# Patient Record
Sex: Male | Born: 1960 | Race: White | Hispanic: No | Marital: Married
Health system: Midwestern US, Community
[De-identification: ages and names within clinical notes are randomized; demographics above are authoritative.]

## PROBLEM LIST (undated history)

## (undated) DIAGNOSIS — C642 Malignant neoplasm of left kidney, except renal pelvis: Principal | ICD-10-CM

## (undated) DIAGNOSIS — M86271 Subacute osteomyelitis, right ankle and foot: Principal | ICD-10-CM

## (undated) DIAGNOSIS — C649 Malignant neoplasm of unspecified kidney, except renal pelvis: Principal | ICD-10-CM

## (undated) DIAGNOSIS — R0989 Other specified symptoms and signs involving the circulatory and respiratory systems: Secondary | ICD-10-CM

## (undated) DIAGNOSIS — R918 Other nonspecific abnormal finding of lung field: Secondary | ICD-10-CM

## (undated) DIAGNOSIS — R52 Pain, unspecified: Secondary | ICD-10-CM

## (undated) DIAGNOSIS — Z8589 Personal history of malignant neoplasm of other organs and systems: Secondary | ICD-10-CM

## (undated) DIAGNOSIS — E119 Type 2 diabetes mellitus without complications: Secondary | ICD-10-CM

## (undated) DIAGNOSIS — C801 Malignant (primary) neoplasm, unspecified: Secondary | ICD-10-CM

## (undated) DIAGNOSIS — I10 Essential (primary) hypertension: Secondary | ICD-10-CM

## (undated) DIAGNOSIS — N189 Chronic kidney disease, unspecified: Secondary | ICD-10-CM

## (undated) DIAGNOSIS — Z87442 Personal history of urinary calculi: Secondary | ICD-10-CM

## (undated) DIAGNOSIS — E785 Hyperlipidemia, unspecified: Secondary | ICD-10-CM

## (undated) HISTORY — PX: VASECTOMY: SHX75

## (undated) HISTORY — DX: Essential (primary) hypertension: I10

## (undated) HISTORY — DX: Type 2 diabetes mellitus without complications: E11.9

## (undated) HISTORY — DX: Hyperlipidemia, unspecified: E78.5

## (undated) MED FILL — CEPHALEXIN 500MG CAPS: 500 MG | 10 days supply | Qty: 40 | Fill #0 | Status: AC

---

## 2000-12-05 ENCOUNTER — Other Ambulatory Visit: Admission: RE | Admit: 2000-12-05 | Discharge: 2000-12-05 | Payer: Self-pay | Admitting: Family Medicine

## 2001-03-31 ENCOUNTER — Emergency Department (HOSPITAL_COMMUNITY): Admission: EM | Admit: 2001-03-31 | Discharge: 2001-03-31 | Payer: Self-pay

## 2006-11-14 ENCOUNTER — Ambulatory Visit (HOSPITAL_COMMUNITY): Admission: RE | Admit: 2006-11-14 | Discharge: 2006-11-14 | Payer: Self-pay | Admitting: Family Medicine

## 2014-07-05 ENCOUNTER — Encounter: Payer: BLUE CROSS/BLUE SHIELD | Attending: Family Medicine

## 2014-07-05 VITALS — Ht 70.0 in | Wt 307.2 lb

## 2014-07-05 DIAGNOSIS — E119 Type 2 diabetes mellitus without complications: Secondary | ICD-10-CM | POA: Diagnosis present

## 2014-07-05 DIAGNOSIS — Z713 Dietary counseling and surveillance: Secondary | ICD-10-CM | POA: Diagnosis not present

## 2014-07-09 NOTE — Progress Notes (Signed)
Patient was seen on 07/05/14 for the first of a series of three diabetes self-management courses at the [facility].  Patient Education Plan per assessed needs and concerns is to attend four course education program for Diabetes Self Management Education.  The following learning objectives were met by the patient during this class:  Describe diabetes  State some common risk factors for diabetes  Defines the role of glucose and insulin  Identifies type of diabetes and pathophysiology  Describe the relationship between diabetes and cardiovascular risk  State the members of the Healthcare Team  States the rationale for glucose monitoring  State when to test glucose  State their individual Target Range  State the importance of logging glucose readings  Describe how to interpret glucose readings  Identifies A1C target  Explain the correlation between A1c and eAG values  State symptoms and treatment of high blood glucose  State symptoms and treatment of low blood glucose  Explain proper technique for glucose testing  Identifies proper sharps disposal  Handouts given during class include:  Living Well with Diabetes book  Carb Counting and Meal Planning book  Meal Plan Card  Carbohydrate guide  Meal planning worksheet  Low Sodium Flavoring Tips  The diabetes portion plate  K5L to eAG Conversion Chart  Diabetes Medications  Diabetes Recommended Care Schedule  Support Group  Diabetes Success Plan  Core Class Satisfaction Survey  Follow-Up Plan:  Attend core 2

## 2014-07-12 DIAGNOSIS — E119 Type 2 diabetes mellitus without complications: Secondary | ICD-10-CM

## 2014-07-12 NOTE — Progress Notes (Signed)

## 2014-07-19 ENCOUNTER — Encounter: Payer: BLUE CROSS/BLUE SHIELD | Attending: Family Medicine

## 2014-07-19 DIAGNOSIS — E119 Type 2 diabetes mellitus without complications: Secondary | ICD-10-CM | POA: Diagnosis not present

## 2014-07-19 DIAGNOSIS — Z713 Dietary counseling and surveillance: Secondary | ICD-10-CM | POA: Diagnosis not present

## 2014-07-20 NOTE — Progress Notes (Signed)
Patient was seen on 07/19/14 for the third of a series of three diabetes self-management courses at the [facility]. The following learning objectives were met by the patient during this class:  . State the amount of activity recommended for healthy living . Describe activities suitable for individual needs . Identify ways to regularly incorporate activity into daily life . Identify barriers to activity and ways to over come these barriers  Identify diabetes medications being personally used and their primary action for lowering glucose and possible side effects . Describe role of stress on blood glucose and develop strategies to address psychosocial issues . Identify diabetes complications and ways to prevent them  Explain how to manage diabetes during illness . Evaluate success in meeting personal goal . Establish 2-3 goals that they will plan to diligently work on until they return for the  23-monthfollow-up visit  Goals:   I will count my carb choices at most meals and snacks and watch portions  I will be active  2 times a week  I will test my glucose at least  3 days a week  Your patient has identified these potential barriers to change:  Finances  Your patient has identified their diabetes self-care support plan as  Family Support  Plan:  Attend Core 4 in 4 months

## 2020-06-23 ENCOUNTER — Inpatient Hospital Stay (HOSPITAL_COMMUNITY)
Admission: EM | Admit: 2020-06-23 | Discharge: 2020-06-26 | DRG: 177 | Disposition: A | Discharge status: Home / Self Care | Payer: BC Managed Care – PPO | Attending: Family Medicine | Admitting: Family Medicine

## 2020-06-23 ENCOUNTER — Encounter (HOSPITAL_COMMUNITY): Payer: Self-pay | Admitting: Emergency Medicine

## 2020-06-23 ENCOUNTER — Emergency Department (HOSPITAL_COMMUNITY): Payer: BC Managed Care – PPO

## 2020-06-23 ENCOUNTER — Other Ambulatory Visit: Payer: Self-pay

## 2020-06-23 DIAGNOSIS — T380X5A Adverse effect of glucocorticoids and synthetic analogues, initial encounter: Secondary | ICD-10-CM | POA: Diagnosis not present

## 2020-06-23 DIAGNOSIS — U071 COVID-19: Principal | ICD-10-CM | POA: Diagnosis present

## 2020-06-23 DIAGNOSIS — Z79899 Other long term (current) drug therapy: Secondary | ICD-10-CM

## 2020-06-23 DIAGNOSIS — Z8249 Family history of ischemic heart disease and other diseases of the circulatory system: Secondary | ICD-10-CM | POA: Diagnosis not present

## 2020-06-23 DIAGNOSIS — Z7982 Long term (current) use of aspirin: Secondary | ICD-10-CM

## 2020-06-23 DIAGNOSIS — Z833 Family history of diabetes mellitus: Secondary | ICD-10-CM

## 2020-06-23 DIAGNOSIS — E119 Type 2 diabetes mellitus without complications: Secondary | ICD-10-CM

## 2020-06-23 DIAGNOSIS — E1165 Type 2 diabetes mellitus with hyperglycemia: Secondary | ICD-10-CM | POA: Diagnosis not present

## 2020-06-23 DIAGNOSIS — Z7984 Long term (current) use of oral hypoglycemic drugs: Secondary | ICD-10-CM | POA: Diagnosis not present

## 2020-06-23 DIAGNOSIS — J1282 Pneumonia due to coronavirus disease 2019: Secondary | ICD-10-CM | POA: Diagnosis present

## 2020-06-23 DIAGNOSIS — J9601 Acute respiratory failure with hypoxia: Secondary | ICD-10-CM | POA: Diagnosis present

## 2020-06-23 DIAGNOSIS — E669 Obesity, unspecified: Secondary | ICD-10-CM | POA: Diagnosis present

## 2020-06-23 DIAGNOSIS — Z6839 Body mass index (BMI) 39.0-39.9, adult: Secondary | ICD-10-CM

## 2020-06-23 DIAGNOSIS — R0902 Hypoxemia: Secondary | ICD-10-CM

## 2020-06-23 DIAGNOSIS — E785 Hyperlipidemia, unspecified: Secondary | ICD-10-CM | POA: Diagnosis present

## 2020-06-23 DIAGNOSIS — I10 Essential (primary) hypertension: Secondary | ICD-10-CM | POA: Diagnosis present

## 2020-06-23 LAB — COMPREHENSIVE METABOLIC PANEL
ALT: 34 U/L (ref 0–44)
AST: 34 U/L (ref 15–41)
Albumin: 3.4 g/dL — ABNORMAL LOW (ref 3.5–5.0)
Alkaline Phosphatase: 57 U/L (ref 38–126)
Anion gap: 12 (ref 5–15)
BUN: 20 mg/dL (ref 8–23)
CO2: 23 mmol/L (ref 22–32)
Calcium: 8.8 mg/dL — ABNORMAL LOW (ref 8.9–10.3)
Chloride: 102 mmol/L (ref 98–111)
Creatinine, Ser: 1.04 mg/dL (ref 0.61–1.24)
GFR, Estimated: >60 mL/min (ref >60)
Glucose, Bld: 267 mg/dL — ABNORMAL HIGH (ref 70–99)
Potassium: 4.4 mmol/L (ref 3.5–5.1)
Sodium: 137 mmol/L (ref 135–145)
Total Bilirubin: 0.6 mg/dL (ref 0.3–1.2)
Total Protein: 7.1 g/dL (ref 6.5–8.1)

## 2020-06-23 LAB — CBC WITH DIFFERENTIAL/PLATELET
Abs Immature Granulocytes: 0.07 10*3/uL (ref 0.00–0.07)
Basophils Absolute: 0 10*3/uL (ref 0.0–0.1)
Basophils Relative: 0 %
Eosinophils Absolute: 0.1 10*3/uL (ref 0.0–0.5)
Eosinophils Relative: 1 %
HCT: 36.9 % — ABNORMAL LOW (ref 39.0–52.0)
Hemoglobin: 12.1 g/dL — ABNORMAL LOW (ref 13.0–17.0)
Immature Granulocytes: 1 %
Lymphocytes Relative: 14 %
Lymphs Abs: 1 10*3/uL (ref 0.7–4.0)
MCH: 28.6 pg (ref 26.0–34.0)
MCHC: 32.8 g/dL (ref 30.0–36.0)
MCV: 87.2 fL (ref 80.0–100.0)
Monocytes Absolute: 0.5 10*3/uL (ref 0.1–1.0)
Monocytes Relative: 8 %
Neutro Abs: 5.3 10*3/uL (ref 1.7–7.7)
Neutrophils Relative %: 76 %
Platelets: 392 10*3/uL (ref 150–400)
RBC: 4.23 MIL/uL (ref 4.22–5.81)
RDW: 14.6 % (ref 11.5–15.5)
WBC: 7 10*3/uL (ref 4.0–10.5)
nRBC: 0 % (ref 0.0–0.2)

## 2020-06-23 LAB — HEMOGLOBIN A1C
Hgb A1c MFr Bld: 8.8 % — ABNORMAL HIGH (ref 4.8–5.6)
Mean Plasma Glucose: 205.86 mg/dL

## 2020-06-23 LAB — RESP PANEL BY RT-PCR (FLU A&B, COVID) ARPGX2
Influenza A by PCR: NEGATIVE
Influenza B by PCR: NEGATIVE
SARS Coronavirus 2 by RT PCR: POSITIVE — AB

## 2020-06-23 LAB — FIBRINOGEN: Fibrinogen: 684 mg/dL — ABNORMAL HIGH (ref 210–475)

## 2020-06-23 LAB — LACTIC ACID, PLASMA: Lactic Acid, Venous: 1.3 mmol/L (ref 0.5–1.9)

## 2020-06-23 LAB — D-DIMER, QUANTITATIVE: D-Dimer, Quant: 1.47 ug/mL-FEU — ABNORMAL HIGH (ref 0.00–0.50)

## 2020-06-23 LAB — TRIGLYCERIDES: Triglycerides: 156 mg/dL — ABNORMAL HIGH (ref <150)

## 2020-06-23 LAB — FERRITIN: Ferritin: 611 ng/mL — ABNORMAL HIGH (ref 24–336)

## 2020-06-23 LAB — PROCALCITONIN: Procalcitonin: 0.12 ng/mL

## 2020-06-23 LAB — LACTATE DEHYDROGENASE: LDH: 318 U/L — ABNORMAL HIGH (ref 98–192)

## 2020-06-23 LAB — C-REACTIVE PROTEIN: CRP: 11.2 mg/dL — ABNORMAL HIGH (ref <1.0)

## 2020-06-23 MED ORDER — INSULIN ASPART 100 UNIT/ML ~~LOC~~ SOLN
0.0000 [IU] | Freq: Three times a day (TID) | SUBCUTANEOUS | Status: DC
  Administered 2020-06-24: 5 [IU] via SUBCUTANEOUS
  Filled 2020-06-23: qty 0.15

## 2020-06-23 MED ORDER — IPRATROPIUM-ALBUTEROL 20-100 MCG/ACT IN AERS
1.0000 | INHALATION_SPRAY | Freq: Three times a day (TID) | RESPIRATORY_TRACT | Status: DC
  Administered 2020-06-24 – 2020-06-26 (×7): 1 via RESPIRATORY_TRACT
  Filled 2020-06-23: qty 4

## 2020-06-23 MED ORDER — PREDNISONE 50 MG PO TABS: 50.0000 mg | ORAL_TABLET | Freq: Every day | ORAL | Status: DC

## 2020-06-23 MED ORDER — ACETAMINOPHEN 325 MG PO TABS: 650.0000 mg | ORAL_TABLET | Freq: Four times a day (QID) | ORAL | Status: DC | PRN

## 2020-06-23 MED ORDER — SODIUM CHLORIDE 0.9 % IV SOLN
100.0000 mg | Freq: Every day | INTRAVENOUS | Status: DC
  Administered 2020-06-24 – 2020-06-26 (×3): 100 mg via INTRAVENOUS
  Filled 2020-06-23 (×3): qty 20

## 2020-06-23 MED ORDER — ONDANSETRON HCL 4 MG/2ML IJ SOLN: 4.0000 mg | Freq: Four times a day (QID) | INTRAMUSCULAR | Status: DC | PRN

## 2020-06-23 MED ORDER — ATORVASTATIN CALCIUM 40 MG PO TABS
40.0000 mg | ORAL_TABLET | Freq: Every day | ORAL | Status: DC
  Administered 2020-06-23 – 2020-06-25 (×3): 40 mg via ORAL
  Filled 2020-06-23 (×4): qty 1

## 2020-06-23 MED ORDER — HYDROCOD POLST-CPM POLST ER 10-8 MG/5ML PO SUER
5.0000 mL | Freq: Two times a day (BID) | ORAL | Status: DC | PRN
  Administered 2020-06-25 (×2): 5 mL via ORAL
  Filled 2020-06-23 (×2): qty 5

## 2020-06-23 MED ORDER — ONDANSETRON HCL 4 MG PO TABS: 4.0000 mg | ORAL_TABLET | Freq: Four times a day (QID) | ORAL | Status: DC | PRN

## 2020-06-23 MED ORDER — ASPIRIN EC 81 MG PO TBEC
81.0000 mg | DELAYED_RELEASE_TABLET | Freq: Every day | ORAL | Status: DC
  Administered 2020-06-24 – 2020-06-26 (×3): 81 mg via ORAL
  Filled 2020-06-23 (×3): qty 1

## 2020-06-23 MED ORDER — PREDNISONE 5 MG PO TABS: 50.0000 mg | ORAL_TABLET | Freq: Every day | ORAL | Status: DC

## 2020-06-23 MED ORDER — METHYLPREDNISOLONE SODIUM SUCC 125 MG IJ SOLR: 0.5000 mg/kg | Freq: Two times a day (BID) | INTRAMUSCULAR | Status: DC

## 2020-06-23 MED ORDER — ASCORBIC ACID 500 MG PO TABS
500.0000 mg | ORAL_TABLET | Freq: Every day | ORAL | Status: DC
  Administered 2020-06-24 – 2020-06-26 (×3): 500 mg via ORAL
  Filled 2020-06-23 (×3): qty 1

## 2020-06-23 MED ORDER — METHYLPREDNISOLONE SODIUM SUCC 125 MG IJ SOLR
60.0000 mg | Freq: Two times a day (BID) | INTRAMUSCULAR | Status: DC
  Administered 2020-06-24: 60 mg via INTRAVENOUS
  Filled 2020-06-23: qty 2

## 2020-06-23 MED ORDER — ZINC SULFATE 220 (50 ZN) MG PO CAPS
220.0000 mg | ORAL_CAPSULE | Freq: Every day | ORAL | Status: DC
  Administered 2020-06-24 – 2020-06-26 (×3): 220 mg via ORAL
  Filled 2020-06-23 (×3): qty 1

## 2020-06-23 MED ORDER — DEXAMETHASONE SODIUM PHOSPHATE 10 MG/ML IJ SOLN
6.0000 mg | Freq: Once | INTRAMUSCULAR | Status: AC
  Administered 2020-06-23: 6 mg via INTRAVENOUS
  Filled 2020-06-23: qty 1

## 2020-06-23 MED ORDER — SODIUM CHLORIDE 0.9 % IV SOLN
200.0000 mg | Freq: Once | INTRAVENOUS | Status: AC
  Administered 2020-06-23: 200 mg via INTRAVENOUS
  Filled 2020-06-23: qty 200

## 2020-06-23 MED ORDER — ENOXAPARIN SODIUM 60 MG/0.6ML ~~LOC~~ SOLN
60.0000 mg | SUBCUTANEOUS | Status: DC
  Administered 2020-06-23 – 2020-06-25 (×3): 60 mg via SUBCUTANEOUS
  Filled 2020-06-23 (×3): qty 0.6

## 2020-06-23 MED ORDER — GUAIFENESIN-DM 100-10 MG/5ML PO SYRP
10.0000 mL | ORAL_SOLUTION | ORAL | Status: DC | PRN
  Administered 2020-06-23: 10 mL via ORAL
  Filled 2020-06-23: qty 10

## 2020-06-23 MED ORDER — INSULIN ASPART 100 UNIT/ML ~~LOC~~ SOLN
0.0000 [IU] | Freq: Every day | SUBCUTANEOUS | Status: DC
  Administered 2020-06-23: 3 [IU] via SUBCUTANEOUS
  Filled 2020-06-23: qty 0.05

## 2020-06-23 MED ORDER — IPRATROPIUM-ALBUTEROL 20-100 MCG/ACT IN AERS: 1.0000 | INHALATION_SPRAY | Freq: Four times a day (QID) | RESPIRATORY_TRACT | Status: DC

## 2020-06-23 MED ORDER — LISINOPRIL 20 MG PO TABS
40.0000 mg | ORAL_TABLET | Freq: Every day | ORAL | Status: DC
  Administered 2020-06-24 – 2020-06-26 (×3): 40 mg via ORAL
  Filled 2020-06-23 (×4): qty 2

## 2020-06-23 NOTE — ED Provider Notes (Signed)
[facility] DEPT Provider Note   CSN: [id number] Arrival date & time: 06/23/20  1016     History No chief complaint on file.   Daniel Herring is a 60 y.o. male.  60 year old male with history of diabetes, hyperlipidemia, hypertension presents with complaint of shortness of breath and low oxygen readings at home.  Patient states that he noticed significant fatigue on December 27 after unloading his car from a trip to [place].  Patient's wife tested positive for COVID, patient did an in-home test on January 1 that was positive.  Patient has been trying to manage his fevers and shortness of breath at home, uses a home pulse ox and states that his oxygen saturations are as low as 72% when walking.  Patient called his PCP and was advised to come to the emergency room. Patient is not vaccinated against COVID.  Daniel Herring was evaluated in Emergency Department on 06/23/2020 for the symptoms described in the history of present illness. He was evaluated in the context of the global COVID-19 pandemic, which necessitated consideration that the patient might be at risk for infection with the SARS-CoV-2 virus that causes COVID-19. Institutional protocols and algorithms that pertain to the evaluation of patients at risk for COVID-19 are in a state of rapid change based on information released by regulatory bodies including the CDC and federal and state organizations. These policies and algorithms were followed during the patient's care in the ED.         Past Medical History:  Diagnosis Date   Diabetes mellitus without complication ([place])    Hyperlipidemia    Hypertension     Patient Active Problem List   Diagnosis Date Noted   Pneumonia due to COVID-19 virus 06/23/2020    Past Surgical History:  Procedure Laterality Date   VASECTOMY         Family History  Problem Relation Age of Onset   Heart disease Other    Hyperlipidemia Other    Hypertension  Other    Diabetes Other        Home Medications Prior to Admission medications   Medication Sig Start Date End Date Taking? Authorizing Provider  aspirin 81 MG tablet Take 81 mg by mouth daily.   Yes [provider]  atorvastatin (LIPITOR) 40 MG tablet Take 40 mg by mouth at bedtime. 04/05/20  Yes [provider]  BYDUREON BCISE 2 MG/0.85ML AUIJ Inject 2 mg into the skin once a week. 05/08/20  Yes [provider]  FARXIGA 10 MG TABS tablet Take 10 mg by mouth daily. 05/26/20  Yes [provider]  glipiZIDE (GLUCOTROL XL) 10 MG 24 hr tablet Take 10 mg by mouth daily. 05/02/20  Yes [provider]  lisinopril (ZESTRIL) 40 MG tablet Take 40 mg by mouth daily. 05/26/20  Yes [provider]  metFORMIN (GLUCOPHAGE-XR) 500 MG 24 hr tablet Take 1,000 mg by mouth 2 (two) times daily. 05/05/20  Yes [provider]  Multiple Vitamins-Minerals (EMERGEN-C IMMUNE) PACK Take 1 packet by mouth daily.   Yes [provider]  Multiple Vitamins-Minerals (ZINC PO) Take 1 tablet by mouth daily.   Yes [provider]    Allergies    Patient has no known allergies.  Review of Systems   Review of Systems  Constitutional: Positive for fatigue and fever.  HENT: Negative for congestion.   Respiratory: Positive for shortness of breath. Negative for cough.   Cardiovascular: Negative for chest pain.  Gastrointestinal: Negative  for diarrhea and vomiting.  Musculoskeletal: Negative for arthralgias and myalgias.  Skin: Negative for rash.  Allergic/Immunologic: Positive for immunocompromised state.  Neurological: Negative for weakness.  Hematological: Negative for adenopathy.  Psychiatric/Behavioral: Negative for confusion.  All other systems reviewed and are negative.   Physical Exam Updated Vital Signs BP (!) 176/97    Pulse 73    Temp 98.2 F (36.8 C) (Oral)    Resp 20    Ht 5\' 10"  (1.778 m)    Wt 124.3 kg    SpO2 93%    BMI  39.31 kg/m   Physical Exam Vitals and nursing note reviewed.  Constitutional:      General: He is not in acute distress.    Appearance: He is well-developed and well-nourished. He is not diaphoretic.  HENT:     Head: Normocephalic and atraumatic.  Eyes:     Conjunctiva/sclera: Conjunctivae normal.  Cardiovascular:     Rate and Rhythm: Normal rate and regular rhythm.     Heart sounds: Normal heart sounds.  Pulmonary:     Effort: Pulmonary effort is normal.     Breath sounds: Normal breath sounds.  Musculoskeletal:     Cervical back: Neck supple.  Skin:    General: Skin is warm and dry.     Findings: No erythema or rash.  Neurological:     Mental Status: He is alert and oriented to person, place, and time.  Psychiatric:        Mood and Affect: Mood and affect normal.        Behavior: Behavior normal.     ED Results / Procedures / Treatments   Labs (all labs ordered are listed, but only abnormal results are displayed) Labs Reviewed  RESP PANEL BY RT-PCR (FLU A&B, COVID) ARPGX2 - Abnormal; Notable for the following components:      Result Value   SARS Coronavirus 2 by RT PCR POSITIVE (*)    All other components within normal limits  CBC WITH DIFFERENTIAL/PLATELET - Abnormal; Notable for the following components:   Hemoglobin 12.1 (*)    HCT 36.9 (*)    All other components within normal limits  COMPREHENSIVE METABOLIC PANEL - Abnormal; Notable for the following components:   Glucose, Bld 267 (*)    Calcium 8.8 (*)    Albumin 3.4 (*)    All other components within normal limits  D-DIMER, QUANTITATIVE (NOT AT [facility]) - Abnormal; Notable for the following components:   D-Dimer, Quant 1.47 (*)    All other components within normal limits  LACTATE DEHYDROGENASE - Abnormal; Notable for the following components:   LDH 318 (*)    All other components within normal limits  FERRITIN - Abnormal; Notable for the following components:   Ferritin 611 (*)    All other components  within normal limits  TRIGLYCERIDES - Abnormal; Notable for the following components:   Triglycerides 156 (*)    All other components within normal limits  FIBRINOGEN - Abnormal; Notable for the following components:   Fibrinogen 684 (*)    All other components within normal limits  C-REACTIVE PROTEIN - Abnormal; Notable for the following components:   CRP 11.2 (*)    All other components within normal limits  CULTURE, BLOOD (ROUTINE X 2)  CULTURE, BLOOD (ROUTINE X 2)  LACTIC ACID, PLASMA  PROCALCITONIN    EKG EKG Interpretation  Date/Time:  Friday June 23 2020 13:08:40 EST Ventricular Rate:  72 PR Interval:    QRS Duration: 93  QT Interval:  415 QTC Calculation: 455 R Axis:   40 Text Interpretation: Sinus rhythm Probable left atrial enlargement Borderline repolarization abnormality Borderline ST elevation, lateral leads No old tracing to compare Confirmed by Lacretia Leigh [phone]) on 06/23/2020 1:14:30 PM   Radiology DG Chest Port 1 View  Result Date: 06/23/2020 CLINICAL DATA:  Pt reported on 06/05/20 his spouse tested positive for COVID-19. Pt tested positive on 12/31 with a home test. Pt is not vaccinated against COVID19. Pt reported dyspnea on exertion. History of diabetes and HTN. EXAM: PORTABLE CHEST 1 VIEW COMPARISON:  None. FINDINGS: Cardiac silhouette normal in size.  No mediastinal or hilar masses. There hazy bilateral airspace lung opacities consistent with multifocal pneumonia. No evidence of pulmonary edema. No pleural effusion or pneumothorax. Skeletal structures are grossly intact. IMPRESSION: 1. Bilateral hazy airspace lung opacities consistent with multifocal pneumonia, pattern compatible with COVID-19 infection. Electronically Signed   By: Lajean Manes M.D.   On: 06/23/2020 11:47    Procedures Procedures (including critical care time)  Medications Ordered in ED Medications  dexamethasone (DECADRON) injection 6 mg (6 mg Intravenous Given 06/23/20 1242)    ED  Course  I have reviewed the triage vital signs and the nursing notes.  Pertinent labs & imaging results that were available during my care of the patient were reviewed by me and considered in my medical decision making (see chart for details).  Clinical Course as of 06/23/20 1449  Fri Jun 24, 5391  4591 60 year old male with positive home COVID test and low O2 readings at home with complaint of shortness of breath and intermittent fevers. Patient was found to have O2 saturation at rest of 89%, was placed on a nasal cannula.  Plan is to order COVID per admission order set, will give Decadron for supplemental oxygen need and COVID-positive patient.  We will plan for admission after labs result. [LM]  2229 Chest x-ray with likely COVID-pneumonia.  Patient's COVID test is positive, negative for flu.  Labs reviewed, CBC with normal WBC, CMP with elevated glucose in this diabetic patient at 267.  Additional labs available for review. Case discussed with Dr. Marylyn Ishihara with [facility] who will consult for admission, requests remdesivir if patient is agreeable with treatment.  Discussed results and plan of care with patient, patient is agreeable with treatment with remdesivir. [LM]    Clinical Course User Index [LM] Roque Lias   MDM Rules/Calculators/A&P                          Final Clinical Impression(s) / ED Diagnoses Final diagnoses:  Pneumonia due to COVID-19 virus  Hypoxia    Rx / DC Orders ED Discharge Orders    None       Tacy Learn, PA-C 06/23/20 1449    Lacretia Leigh, MD 06/27/20 [phone]

## 2020-06-23 NOTE — H&P (Signed)
History and Physical    Daniel Herring QIH:[id number] DOB: [birth year] DOA: 06/23/2020  PCP: Jefm Petty, MD  Patient coming from: Home  Chief Complaint: dyspnea, hypoxia  HPI: Daniel Herring is a 60 y.o. male with medical history significant of DM2, HTN, HLD. Presenting w/ dyspnea. He has apparently had fatigue since about Christmas. He had noticed intermittent fevers and dyspnea shortly after. He tried some NyQuil, DayQuil, and aleve but they didn't help. His wife tested positive for COVID, so he took a COVID test. He was positive on Jan 1st. He tried to continue managing his symptoms at home with OTC meds, but his symptoms were not improving. He apparently had a home pulse-ox reading of 72%. He spoke with his PCP and they recommended he come to the ED.     ED Course: He was found to be hypoxic. CXR was c/w COVID 19. She was started on decadron and remdes. [facility] was called for admission.   Review of Systems: Review of systems is otherwise negative for all not mentioned in HPI.   PMHx Past Medical History:  Diagnosis Date  . Diabetes mellitus without complication ([place])   . Hyperlipidemia   . Hypertension     PSHx Past Surgical History:  Procedure Laterality Date  . VASECTOMY      SocHx  reports that he has never smoked. He has never used smokeless tobacco. He reports that he does not drink alcohol and does not use drugs.  No Known Allergies  FamHx Family History  Problem Relation Age of Onset  . Heart disease Other   . Hyperlipidemia Other   . Hypertension Other   . Diabetes Other     Prior to Admission medications   Medication Sig Start Date End Date Taking? Authorizing Provider  aspirin 81 MG tablet Take 81 mg by mouth daily.   Yes [provider]  atorvastatin (LIPITOR) 40 MG tablet Take 40 mg by mouth at bedtime. 04/05/20  Yes [provider]  BYDUREON BCISE 2 MG/0.85ML AUIJ Inject 2 mg into the skin once a week. 05/08/20  Yes [provider]  FARXIGA 10 MG TABS tablet Take 10 mg by mouth daily. 05/26/20  Yes [provider]  glipiZIDE (GLUCOTROL XL) 10 MG 24 hr tablet Take 10 mg by mouth daily. 05/02/20  Yes [provider]  lisinopril (ZESTRIL) 40 MG tablet Take 40 mg by mouth daily. 05/26/20  Yes [provider]  metFORMIN (GLUCOPHAGE-XR) 500 MG 24 hr tablet Take 1,000 mg by mouth 2 (two) times daily. 05/05/20  Yes [provider]  Multiple Vitamins-Minerals (EMERGEN-C IMMUNE) PACK Take 1 packet by mouth daily.   Yes [provider]  Multiple Vitamins-Minerals (ZINC PO) Take 1 tablet by mouth daily.   Yes [provider]    Physical Exam: Vitals:   06/23/20 1057 06/23/20 1300 06/23/20 1330 06/23/20 1400  BP:  127/65 (!) 162/102 (!) 176/97  Pulse: 84 76 76 73  Resp:   18 20  Temp:      TempSrc:      SpO2: 93% 92% 91% 93%  Weight:      Height:        General: 60 y.o. male resting in bed in NAD Eyes: PERRL, normal sclera ENMT: Nares patent w/o discharge, orophaynx clear, dentition normal, ears w/o discharge/lesions/ulcers Neck: Supple, trachea midline Cardiovascular: RRR, +S1, S2, no m/g/r, equal pulses throughout Respiratory: decreased at bases, no w/r/r, normal WOB on 4L [place] GI: BS+, NDNT, no masses  noted, no organomegaly noted MSK: No e/c/c Skin: No rashes, bruises, ulcerations noted Neuro: A&O x 3, no focal deficits Psyc: Appropriate interaction and affect, calm/cooperative  Labs on Admission: I have personally reviewed following labs and imaging studies  CBC: Recent Labs  Lab 06/23/20 1113  WBC 7.0  NEUTROABS 5.3  HGB 12.1*  HCT 36.9*  MCV 87.2  PLT 623   Basic Metabolic Panel: Recent Labs  Lab 06/23/20 1113  NA 137  K 4.4  CL 102  CO2 23  GLUCOSE 267*  BUN 20  CREATININE 1.04  CALCIUM 8.8*   GFR: Estimated Creatinine Clearance: 98.6 mL/min (by C-G formula based on SCr of 1.04 mg/dL). Liver Function Tests: Recent Labs   Lab 06/23/20 1113  AST 34  ALT 34  ALKPHOS 57  BILITOT 0.6  PROT 7.1  ALBUMIN 3.4*   No results for input(s): LIPASE, AMYLASE in the last 168 hours. No results for input(s): AMMONIA in the last 168 hours. Coagulation Profile: No results for input(s): INR, PROTIME in the last 168 hours. Cardiac Enzymes: No results for input(s): CKTOTAL, CKMB, CKMBINDEX, TROPONINI in the last 168 hours. BNP (last 3 results) No results for input(s): PROBNP in the last 8760 hours. HbA1C: No results for input(s): HGBA1C in the last 72 hours. CBG: No results for input(s): GLUCAP in the last 168 hours. Lipid Profile: Recent Labs    06/23/20 1113  TRIG 156*   Thyroid Function Tests: No results for input(s): TSH, T4TOTAL, FREET4, T3FREE, THYROIDAB in the last 72 hours. Anemia Panel: Recent Labs    06/23/20 1113  FERRITIN 611*   Urine analysis: No results found for: COLORURINE, APPEARANCEUR, LABSPEC, PHURINE, GLUCOSEU, HGBUR, BILIRUBINUR, KETONESUR, PROTEINUR, UROBILINOGEN, NITRITE, LEUKOCYTESUR  Radiological Exams on Admission: DG Chest Port 1 View  Result Date: 06/23/2020 CLINICAL DATA:  Pt reported on 06/05/20 his spouse tested positive for COVID-19. Pt tested positive on 12/31 with a home test. Pt is not vaccinated against COVID19. Pt reported dyspnea on exertion. History of diabetes and HTN. EXAM: PORTABLE CHEST 1 VIEW COMPARISON:  None. FINDINGS: Cardiac silhouette normal in size.  No mediastinal or hilar masses. There hazy bilateral airspace lung opacities consistent with multifocal pneumonia. No evidence of pulmonary edema. No pleural effusion or pneumothorax. Skeletal structures are grossly intact. IMPRESSION: 1. Bilateral hazy airspace lung opacities consistent with multifocal pneumonia, pattern compatible with COVID-19 infection. Electronically Signed   By: Lajean Manes M.D.   On: 06/23/2020 11:47   Assessment/Plan COVID 19 PNA     - admit to inpt, med surg     - steroids, remdes,  inhalers, anti-tussives, IS, FV     - currently on 4L [place], wean O2 as able     - follow inflammatory markers  DM2     - SSI, DM2 diet, glucose checks, A1c  HTN     - continue lisinopril  HLD     - continue atorvastatin  DVT prophylaxis: lovenox  Code Status: Intubation Only  Family Communication: None at bedside  Consults called: None   Status is: Inpatient  Remains inpatient appropriate because:Inpatient level of care appropriate due to severity of illness   Dispo: The patient is from: Home              Anticipated d/c is to: Home              Anticipated d/c date is: > 3 days              Patient  currently is not medically stable to d/c.  Jonnie Finner DO [facility]  If 7PM-7AM, please contact night-coverage [email or web address]  06/23/2020, 4:25 PM

## 2020-06-23 NOTE — ED Notes (Addendum)
During triage, pt O2 around 84-86%. Applied 2L nasal canula and O2 increased 88%. Increased O2 to 4L and O2 increased to 93%. I encourage the pt to wear his mask when personnel are in the room. He said it was too difficult while wearing oxygen and he didn't understand.

## 2020-06-23 NOTE — Plan of Care (Signed)

## 2020-06-23 NOTE — ED Triage Notes (Addendum)
Pt reports the following:  On 12/20 pt spouse tested positive for COVID-19. Pt tested positive on 12/31 with a home test. Pt is not vaccinated against COVID19. 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 I am doing everything I can to get well. This morning I went to the store and bought some super beets." Pt reports current sx include dyspnea on exertion.

## 2020-06-23 NOTE — ED Notes (Signed)
Report called to Vee, RN on 3W

## 2020-06-24 DIAGNOSIS — J1282 Pneumonia due to coronavirus disease 2019: Secondary | ICD-10-CM | POA: Diagnosis not present

## 2020-06-24 DIAGNOSIS — U071 COVID-19: Secondary | ICD-10-CM | POA: Diagnosis not present

## 2020-06-24 LAB — HIV ANTIBODY (ROUTINE TESTING W REFLEX): HIV Screen 4th Generation wRfx: NONREACTIVE

## 2020-06-24 LAB — COMPREHENSIVE METABOLIC PANEL
ALT: 33 U/L (ref 0–44)
AST: 29 U/L (ref 15–41)
Albumin: 3.1 g/dL — ABNORMAL LOW (ref 3.5–5.0)
Alkaline Phosphatase: 52 U/L (ref 38–126)
Anion gap: 10 (ref 5–15)
BUN: 22 mg/dL (ref 8–23)
CO2: 23 mmol/L (ref 22–32)
Calcium: 8.9 mg/dL (ref 8.9–10.3)
Chloride: 102 mmol/L (ref 98–111)
Creatinine, Ser: 0.88 mg/dL (ref 0.61–1.24)
GFR, Estimated: >60 mL/min (ref >60)
Glucose, Bld: 270 mg/dL — ABNORMAL HIGH (ref 70–99)
Potassium: 4.7 mmol/L (ref 3.5–5.1)
Sodium: 135 mmol/L (ref 135–145)
Total Bilirubin: 0.6 mg/dL (ref 0.3–1.2)
Total Protein: 6.7 g/dL (ref 6.5–8.1)

## 2020-06-24 LAB — GLUCOSE, CAPILLARY
Glucose-Capillary: 240 mg/dL — ABNORMAL HIGH (ref 70–99)
Glucose-Capillary: 307 mg/dL — ABNORMAL HIGH (ref 70–99)
Glucose-Capillary: 380 mg/dL — ABNORMAL HIGH (ref 70–99)
Glucose-Capillary: 289 mg/dL — ABNORMAL HIGH (ref 70–99)

## 2020-06-24 LAB — CBC WITH DIFFERENTIAL/PLATELET
Abs Immature Granulocytes: 0.05 10*3/uL (ref 0.00–0.07)
Basophils Absolute: 0 10*3/uL (ref 0.0–0.1)
Basophils Relative: 0 %
Eosinophils Absolute: 0 10*3/uL (ref 0.0–0.5)
Eosinophils Relative: 0 %
HCT: 36.6 % — ABNORMAL LOW (ref 39.0–52.0)
Hemoglobin: 11.8 g/dL — ABNORMAL LOW (ref 13.0–17.0)
Immature Granulocytes: 1 %
Lymphocytes Relative: 12 %
Lymphs Abs: 0.7 10*3/uL (ref 0.7–4.0)
MCH: 28.1 pg (ref 26.0–34.0)
MCHC: 32.2 g/dL (ref 30.0–36.0)
MCV: 87.1 fL (ref 80.0–100.0)
Monocytes Absolute: 0.3 10*3/uL (ref 0.1–1.0)
Monocytes Relative: 5 %
Neutro Abs: 5 10*3/uL (ref 1.7–7.7)
Neutrophils Relative %: 82 %
Platelets: 406 10*3/uL — ABNORMAL HIGH (ref 150–400)
RBC: 4.2 MIL/uL — ABNORMAL LOW (ref 4.22–5.81)
RDW: 14.4 % (ref 11.5–15.5)
WBC: 6.1 10*3/uL (ref 4.0–10.5)
nRBC: 0 % (ref 0.0–0.2)

## 2020-06-24 LAB — FERRITIN: Ferritin: 637 ng/mL — ABNORMAL HIGH (ref 24–336)

## 2020-06-24 LAB — D-DIMER, QUANTITATIVE: D-Dimer, Quant: 1.63 ug/mL-FEU — ABNORMAL HIGH (ref 0.00–0.50)

## 2020-06-24 LAB — C-REACTIVE PROTEIN: CRP: 10.3 mg/dL — ABNORMAL HIGH (ref <1.0)

## 2020-06-24 MED ORDER — INSULIN ASPART 100 UNIT/ML ~~LOC~~ SOLN
4.0000 [IU] | Freq: Three times a day (TID) | SUBCUTANEOUS | Status: DC
  Administered 2020-06-24 (×2): 4 [IU] via SUBCUTANEOUS

## 2020-06-24 MED ORDER — INSULIN ASPART 100 UNIT/ML ~~LOC~~ SOLN
0.0000 [IU] | Freq: Every day | SUBCUTANEOUS | Status: DC
  Administered 2020-06-24: 3 [IU] via SUBCUTANEOUS
  Administered 2020-06-25: 5 [IU] via SUBCUTANEOUS

## 2020-06-24 MED ORDER — INSULIN DETEMIR 100 UNIT/ML ~~LOC~~ SOLN
15.0000 [IU] | Freq: Two times a day (BID) | SUBCUTANEOUS | Status: DC
  Administered 2020-06-24 (×2): 15 [IU] via SUBCUTANEOUS
  Filled 2020-06-24 (×3): qty 0.15

## 2020-06-24 MED ORDER — DEXAMETHASONE 4 MG PO TABS
6.0000 mg | ORAL_TABLET | Freq: Every day | ORAL | Status: DC
  Administered 2020-06-25: 6 mg via ORAL
  Filled 2020-06-24: qty 2

## 2020-06-24 MED ORDER — INSULIN ASPART 100 UNIT/ML ~~LOC~~ SOLN
0.0000 [IU] | Freq: Three times a day (TID) | SUBCUTANEOUS | Status: DC
  Administered 2020-06-24: 15 [IU] via SUBCUTANEOUS
  Administered 2020-06-24: 20 [IU] via SUBCUTANEOUS
  Administered 2020-06-25: 11 [IU] via SUBCUTANEOUS
  Administered 2020-06-25: 7 [IU] via SUBCUTANEOUS
  Administered 2020-06-25: 4 [IU] via SUBCUTANEOUS

## 2020-06-24 NOTE — Progress Notes (Addendum)
PROGRESS NOTE  Daniel Herring  IRC:[id number] DOB: [birth year] DOA: 06/23/2020 PCP: Jefm Petty, MD   Brief Narrative: Daniel Herring is a 60 y.o. male with a history of T2DM, HTN, HLD, and obesity who presented to the ED 06/23/2020 with mild dyspnea and hypoxia on home pulse oximetry after initially testing positive for covid-19 at home 06/16/2020. Hypoxia was confirmed on arrival with bilateral hazy opacities on CXR. Remdesivir and decadron were started and the patient admitted for acute hypoxic respiratory failure due to covid-19 pneumonia.   Assessment & Plan: Active Problems:   Pneumonia due to COVID-19 virus  Acute hypoxemic respiratory failure due to covid-19 pneumonia: SARS-CoV-2 Ag positive on 12/31. CRP grossly elevated, remains >10. PCT 0.12.  - Continue remdesivir (started 1/7) - Continue, but taper steroids, attempting to balance need for antiinflammatory effect during latter course of covid pneumonia with hypoxia with severe hyperglycemia.  - Attempt to wean to room air today. - Encourage OOB, IS, FV, and awake proning if able - Continue airborne, contact precautions for 21 days from positive testing. - Monitor CMP and inflammatory markers - Enoxaparin prophylactic dose.  - Encouraged to get vaccine    T2DM, uncontrolled with steroid-induced hyperglycemia: HbA1c 8.8%. - Augment insulin, add levemir and mealtime insulin.  HTN:  - Lisinopril  HLD:  - Statin  Obesity: Estimated body mass index is 39.31 kg/m as calculated from the following:   Height as of this encounter: 5\' 10"  (1.778 m).   Weight as of this encounter: 124.3 kg.  DVT prophylaxis: Lovenox Code Status: Intubation only Family Communication: None at bedside, pt to relay POC Disposition Plan:  Status is: Inpatient  Remains inpatient appropriate because:Inpatient level of care appropriate due to severity of illness  Dispo: The patient is from: Home              Anticipated d/c is to: Home               Anticipated d/c date is: 1 day              Patient currently is not medically stable to d/c.  Consultants:   None  Procedures:   None  Antimicrobials:  Remdesivir   Subjective: Shortness of breath is minimal when ambulating, though never really had dyspnea. Still having fatigue  that is worse with walking and much worse than his baseline. No chest pain or other complaints.   Objective: Vitals:   06/23/20 2200 06/23/20 2237 06/24/20 0209 06/24/20 0650  BP: (!) 134/100 140/77 128/76 133/80  Pulse: 70 72 72 63  Resp: 17 16 20 16   Temp:  97.6 F (36.4 C) 97.8 F (36.6 C) 98.1 F (36.7 C)  TempSrc:  Oral    SpO2: 95% 97% 91% 94%  Weight:      Height:        Intake/Output Summary (Last 24 hours) at 06/24/2020 0908 Last data filed at 06/24/2020 0650 Gross per 24 hour  Intake 720 ml  Output 1050 ml  Net -330 ml   Filed Weights   06/23/20 1048  Weight: 124.3 kg    Gen: 60 y.o. male in no distress Pulm: Non-labored breathing 4L O2 at rest. Very minimal crackles laterally. CV: Regular rate and rhythm. No murmur, rub, or gallop. No JVD, no pedal edema. GI: Abdomen soft, non-tender, non-distended, with normoactive bowel sounds. No organomegaly or masses felt. Ext: Warm, no deformities Skin: No rashes, lesions or ulcers Neuro: Alert and oriented. No focal neurological deficits. Psych: Judgement  and insight appear normal. Mood & affect appropriate.   Data Reviewed: I have personally reviewed following labs and imaging studies  CBC: Recent Labs  Lab 06/23/20 1113 06/24/20 0248  WBC 7.0 6.1  NEUTROABS 5.3 5.0  HGB 12.1* 11.8*  HCT 36.9* 36.6*  MCV 87.2 87.1  PLT 392 562*   Basic Metabolic Panel: Recent Labs  Lab 06/23/20 1113 06/24/20 0805  NA 137 135  K 4.4 4.7  CL 102 102  CO2 23 23  GLUCOSE 267* 270*  BUN 20 22  CREATININE 1.04 0.88  CALCIUM 8.8* 8.9   GFR: Estimated Creatinine Clearance: 116.6 mL/min (by C-G formula based on SCr of 0.88  mg/dL). Liver Function Tests: Recent Labs  Lab 06/23/20 1113 06/24/20 0805  AST 34 29  ALT 34 33  ALKPHOS 57 52  BILITOT 0.6 0.6  PROT 7.1 6.7  ALBUMIN 3.4* 3.1*   No results for input(s): LIPASE, AMYLASE in the last 168 hours. No results for input(s): AMMONIA in the last 168 hours. Coagulation Profile: No results for input(s): INR, PROTIME in the last 168 hours. Cardiac Enzymes: No results for input(s): CKTOTAL, CKMB, CKMBINDEX, TROPONINI in the last 168 hours. BNP (last 3 results) No results for input(s): PROBNP in the last 8760 hours. HbA1C: Recent Labs    06/23/20 1113  HGBA1C 8.8*   CBG: Recent Labs  Lab 06/24/20 0740  GLUCAP 240*   Lipid Profile: Recent Labs    06/23/20 1113  TRIG 156*   Thyroid Function Tests: No results for input(s): TSH, T4TOTAL, FREET4, T3FREE, THYROIDAB in the last 72 hours. Anemia Panel: Recent Labs    06/23/20 1113 06/24/20 0248  FERRITIN 611* 637*   Urine analysis: No results found for: COLORURINE, APPEARANCEUR, LABSPEC, PHURINE, GLUCOSEU, HGBUR, BILIRUBINUR, KETONESUR, PROTEINUR, UROBILINOGEN, NITRITE, LEUKOCYTESUR Recent Results (from the past 240 hour(s))  Resp Panel by RT-PCR (Flu A&B, Covid) Nasopharyngeal Swab     Status: Abnormal   Collection Time: 06/23/20 11:13 AM   Specimen: Nasopharyngeal Swab; Nasopharyngeal(NP) swabs in vial transport medium  Result Value Ref Range Status   SARS Coronavirus 2 by RT PCR POSITIVE (A) NEGATIVE Final    Comment: RESULT CALLED TO, READ BACK BY AND VERIFIED WITH: GRANT,K. RN @1447  ON 01.07.2022 BY COHEN,K (NOTE) SARS-CoV-2 target nucleic acids are DETECTED.  The SARS-CoV-2 RNA is generally detectable in upper respiratory specimens during the acute phase of infection. Positive results are indicative of the presence of the identified virus, but do not rule out bacterial infection or co-infection with other pathogens not detected by the test. Clinical correlation with patient history  and other diagnostic information is necessary to determine patient infection status. The expected result is Negative.  Fact Sheet for Patients: EntrepreneurPulse.com.au  Fact Sheet for Healthcare Providers: IncredibleEmployment.be  This test is not yet approved or cleared by the [place] FDA and  has been authorized for detection and/or diagnosis of SARS-CoV-2 by FDA under an Emergency Use Authorization (EUA).  This EUA will remain in effect (meaning this te st can be used) for the duration of  the COVID-19 declaration under Section 564(b)(1) of the Act, 21 U.S.C. section 360bbb-3(b)(1), unless the authorization is terminated or revoked sooner.     Influenza A by PCR NEGATIVE NEGATIVE Final   Influenza B by PCR NEGATIVE NEGATIVE Final    Comment: (NOTE) The Xpert Xpress SARS-CoV-2/FLU/RSV plus assay is intended as an aid in the diagnosis of influenza from Nasopharyngeal swab specimens and should not be used as a  sole basis for treatment. Nasal washings and aspirates are unacceptable for Xpert Xpress SARS-CoV-2/FLU/RSV testing.  Fact Sheet for Patients: EntrepreneurPulse.com.au  Fact Sheet for Healthcare Providers: IncredibleEmployment.be  This test is not yet approved or cleared by the [place] FDA and has been authorized for detection and/or diagnosis of SARS-CoV-2 by FDA under an Emergency Use Authorization (EUA). This EUA will remain in effect (meaning this test can be used) for the duration of the COVID-19 declaration under Section 564(b)(1) of the Act, 21 U.S.C. section 360bbb-3(b)(1), unless the authorization is terminated or revoked.  Performed at [facility], [place] [address], [place], [place] [address]   Blood Culture (routine x 2)     Status: None (Preliminary result)   Collection Time: 06/23/20 11:13 AM   Specimen: BLOOD RIGHT HAND  Result Value Ref Range Status    Specimen Description   Final    BLOOD RIGHT HAND Performed at [facility], [place] [address], [place], [place] [address]    Special Requests   Final    Blood Culture results may not be optimal due to an inadequate volume of blood received in culture bottles Performed at [facility] [address], [place], [place] [address]    Culture PENDING  Incomplete   Report Status PENDING  Incomplete      Radiology Studies: DG Chest Port 1 View  Result Date: 06/23/2020 CLINICAL DATA:  Pt reported on 06/05/20 his spouse tested positive for COVID-19. Pt tested positive on 12/31 with a home test. Pt is not vaccinated against COVID19. Pt reported dyspnea on exertion. History of diabetes and HTN. EXAM: PORTABLE CHEST 1 VIEW COMPARISON:  None. FINDINGS: Cardiac silhouette normal in size.  No mediastinal or hilar masses. There hazy bilateral airspace lung opacities consistent with multifocal pneumonia. No evidence of pulmonary edema. No pleural effusion or pneumothorax. Skeletal structures are grossly intact. IMPRESSION: 1. Bilateral hazy airspace lung opacities consistent with multifocal pneumonia, pattern compatible with COVID-19 infection. Electronically Signed   By: Lajean Manes M.D.   On: 06/23/2020 11:47    Scheduled Meds: . vitamin C  500 mg Oral Daily  . aspirin EC  81 mg Oral Daily  . atorvastatin  40 mg Oral QHS  . [START ON 06/25/2020] dexamethasone  6 mg Oral Daily  . enoxaparin (LOVENOX) injection  60 mg Subcutaneous Q24H  . insulin aspart  0-15 Units Subcutaneous TID WC  . insulin aspart  0-5 Units Subcutaneous QHS  . Ipratropium-Albuterol  1 puff Inhalation TID  . lisinopril  40 mg Oral Daily  . zinc sulfate  220 mg Oral Daily   Continuous Infusions: . remdesivir 100 mg in NS 100 mL       LOS: 1 day   Time spent: 35 minutes.  Patrecia Pour, MD [facility] [email or web address] 06/24/2020, 9:08 AM

## 2020-06-24 NOTE — Progress Notes (Signed)
Per Lab Staff, CMP lab drawn earlier is hemolyzed and unable to use. Another one is needed. New order for a CMP placed.  Awaiting Lab to collect.

## 2020-06-24 NOTE — Progress Notes (Signed)
Noted per MD orders: continuous pox ordered. Per portable equip staff " there is not one available at this time. If one becomes available, I will bring it."  Charge Nurse updated. Pt vss at this time, respirations even and unlabored. Some DOE.  Phone and call light in reach. Pt with at steady gait, refuses non skid given socks. Educated  Patient and secured socks at the foot of the bed.

## 2020-06-25 DIAGNOSIS — U071 COVID-19: Secondary | ICD-10-CM | POA: Diagnosis not present

## 2020-06-25 DIAGNOSIS — J1282 Pneumonia due to coronavirus disease 2019: Secondary | ICD-10-CM | POA: Diagnosis not present

## 2020-06-25 LAB — C-REACTIVE PROTEIN: CRP: 6.1 mg/dL — ABNORMAL HIGH (ref <1.0)

## 2020-06-25 LAB — GLUCOSE, CAPILLARY
Glucose-Capillary: 156 mg/dL — ABNORMAL HIGH (ref 70–99)
Glucose-Capillary: 219 mg/dL — ABNORMAL HIGH (ref 70–99)
Glucose-Capillary: 272 mg/dL — ABNORMAL HIGH (ref 70–99)
Glucose-Capillary: 368 mg/dL — ABNORMAL HIGH (ref 70–99)

## 2020-06-25 LAB — D-DIMER, QUANTITATIVE: D-Dimer, Quant: 1.19 ug/mL-FEU — ABNORMAL HIGH (ref 0.00–0.50)

## 2020-06-25 MED ORDER — INSULIN ASPART 100 UNIT/ML ~~LOC~~ SOLN
8.0000 [IU] | Freq: Three times a day (TID) | SUBCUTANEOUS | Status: DC
  Administered 2020-06-25 – 2020-06-26 (×4): 8 [IU] via SUBCUTANEOUS

## 2020-06-25 MED ORDER — LINAGLIPTIN 5 MG PO TABS
5.0000 mg | ORAL_TABLET | Freq: Every day | ORAL | Status: DC
  Administered 2020-06-25 – 2020-06-26 (×2): 5 mg via ORAL
  Filled 2020-06-25 (×2): qty 1

## 2020-06-25 MED ORDER — INSULIN DETEMIR 100 UNIT/ML ~~LOC~~ SOLN
30.0000 [IU] | Freq: Two times a day (BID) | SUBCUTANEOUS | Status: DC
  Administered 2020-06-25: 30 [IU] via SUBCUTANEOUS
  Filled 2020-06-25: qty 0.3

## 2020-06-25 NOTE — Plan of Care (Signed)

## 2020-06-25 NOTE — Progress Notes (Signed)
Nutrition Brief Note  Patient identified on the Malnutrition Screening Tool (MST) Report  Pt with insignificant weight changes per weight records.  Pt now consuming 80-100% of meals.   Wt Readings from Last 15 Encounters:  06/23/20 124.3 kg  07/09/14 (!) 139.3 kg    Body mass index is 39.31 kg/m. Patient meets criteria for obesity based on current BMI.   Current diet order is CHO modified, patient is consuming approximately 80-100% of meals at this time. Labs and medications reviewed.   No nutrition interventions warranted at this time. If nutrition issues arise, please consult RD.  Clayton Bibles, MS, RD, LDN Inpatient Clinical Dietitian Contact information available via Amion

## 2020-06-25 NOTE — Progress Notes (Signed)
PROGRESS NOTE  Daniel Herring  [id number] DOB: [birth year] DOA: 06/23/2020 PCP: Jefm Petty, MD   Brief Narrative: Daniel Herring is a 60 y.o. male with a history of T2DM, HTN, HLD, and obesity who presented to the ED 06/23/2020 with mild dyspnea and hypoxia on home pulse oximetry after initially testing positive for covid-19 at home 06/16/2020. Hypoxia was confirmed on arrival with bilateral hazy opacities on CXR. Remdesivir and decadron were started and the patient admitted for acute hypoxic respiratory failure due to covid-19 pneumonia. Treatment including steroids has been complicated by severe hyperglycemia requiring initiation and upward titration of insulin. Hypoxia is improving.  Assessment & Plan: Active Problems:   Pneumonia due to COVID-19 virus  Acute hypoxemic respiratory failure due to covid-19 pneumonia: SARS-CoV-2 Ag positive on 12/31. CRP grossly elevated, remains >10. PCT 0.12.  - Continue remdesivir (started 1/7) - Stop steroids due to severe hyperglycemia and improvement in hypoxia with diminution of inflammatory marker.   T2DM, uncontrolled with steroid-induced hyperglycemia: HbA1c 8.8%. - Augment insulin: Increase levemir significantly this AM, hold PM dose as steroids are being stopped. Continue mealtime (increase) and resistant SSI.   HTN:  - Lisinopril  HLD:  - Statin  Obesity: Estimated body mass index is 39.31 kg/m as calculated from the following:   Height as of this encounter: 5\' 10"  (1.778 m).   Weight as of this encounter: 124.3 kg.  DVT prophylaxis: Lovenox Code Status: Intubation only Family Communication: None at bedside, pt to relay POC Disposition Plan:  Status is: Inpatient  Remains inpatient appropriate because:Inpatient level of care appropriate due to severity of illness  Dispo: The patient is from: Home              Anticipated d/c is to: Home              Anticipated d/c date is: 1 day              Patient currently is not  medically stable to d/c.  Consultants:   None  Procedures:   None  Antimicrobials:  Remdesivir   Subjective: Feels much better. No shortness of breath. SpO2 reading up to 99% but also into 80%'s while sitting EOB. Possibly erroneous readings. No chest pain, leg swelling, bleeding. Cough is stable. Eating ok.   Objective: Vitals:   06/24/20 1321 06/24/20 1700 06/24/20 2108 06/25/20 0530  BP: 124/60  128/84 (!) 146/87  Pulse: 77  71 67  Resp: 20  18 16   Temp: 97.6 F (36.4 C)  97.9 F (36.6 C) 97.9 F (36.6 C)  TempSrc: Oral  Oral Oral  SpO2: 90% 91% 94% 96%  Weight:      Height:        Intake/Output Summary (Last 24 hours) at 06/25/2020 1225 Last data filed at 06/25/2020 0900 Gross per 24 hour  Intake 960 ml  Output --  Net 960 ml   Filed Weights   06/23/20 1048  Weight: 124.3 kg   Gen: 60 y.o. male in no distress Pulm: Nonlabored breathing, cleared without wheezes. CV: Regular rate and rhythm. No murmur, rub, or gallop. No JVD, no dependent edema. GI: Abdomen soft, non-tender, non-distended, with normoactive bowel sounds.  Ext: Warm, no deformities Skin: No rashes, lesions or ulcers on visualized skin. Neuro: Alert and oriented. No focal neurological deficits. Psych: Judgement and insight appear fair. Mood euthymic & affect congruent. Behavior is appropriate.    Data Reviewed: I have personally reviewed following labs and imaging studies  CBC: Recent  Labs  Lab 06/23/20 1113 06/24/20 0248  WBC 7.0 6.1  NEUTROABS 5.3 5.0  HGB 12.1* 11.8*  HCT 36.9* 36.6*  MCV 87.2 87.1  PLT 392 546*   Basic Metabolic Panel: Recent Labs  Lab 06/23/20 1113 06/24/20 0805  NA 137 135  K 4.4 4.7  CL 102 102  CO2 23 23  GLUCOSE 267* 270*  BUN 20 22  CREATININE 1.04 0.88  CALCIUM 8.8* 8.9   GFR: Estimated Creatinine Clearance: 116.6 mL/min (by C-G formula based on SCr of 0.88 mg/dL). Liver Function Tests: Recent Labs  Lab 06/23/20 1113 06/24/20 0805  AST 34  29  ALT 34 33  ALKPHOS 57 52  BILITOT 0.6 0.6  PROT 7.1 6.7  ALBUMIN 3.4* 3.1*   No results for input(s): LIPASE, AMYLASE in the last 168 hours. No results for input(s): AMMONIA in the last 168 hours. Coagulation Profile: No results for input(s): INR, PROTIME in the last 168 hours. Cardiac Enzymes: No results for input(s): CKTOTAL, CKMB, CKMBINDEX, TROPONINI in the last 168 hours. BNP (last 3 results) No results for input(s): PROBNP in the last 8760 hours. HbA1C: Recent Labs    06/23/20 1113  HGBA1C 8.8*   CBG: Recent Labs  Lab 06/24/20 1113 06/24/20 1702 06/24/20 2110 06/25/20 0719 06/25/20 1159  GLUCAP 307* 380* 289* 156* 219*   Lipid Profile: Recent Labs    06/23/20 1113  TRIG 156*   Thyroid Function Tests: No results for input(s): TSH, T4TOTAL, FREET4, T3FREE, THYROIDAB in the last 72 hours. Anemia Panel: Recent Labs    06/23/20 1113 06/24/20 0248  FERRITIN 611* 637*   Urine analysis: No results found for: COLORURINE, APPEARANCEUR, LABSPEC, PHURINE, GLUCOSEU, HGBUR, BILIRUBINUR, KETONESUR, PROTEINUR, UROBILINOGEN, NITRITE, LEUKOCYTESUR Recent Results (from the past 240 hour(s))  Resp Panel by RT-PCR (Flu A&B, Covid) Nasopharyngeal Swab     Status: Abnormal   Collection Time: 06/23/20 11:13 AM   Specimen: Nasopharyngeal Swab; Nasopharyngeal(NP) swabs in vial transport medium  Result Value Ref Range Status   SARS Coronavirus 2 by RT PCR POSITIVE (A) NEGATIVE Final    Comment: RESULT CALLED TO, READ BACK BY AND VERIFIED WITH: GRANT,K. RN @1447  ON 01.07.2022 BY COHEN,K (NOTE) SARS-CoV-2 target nucleic acids are DETECTED.  The SARS-CoV-2 RNA is generally detectable in upper respiratory specimens during the acute phase of infection. Positive results are indicative of the presence of the identified virus, but do not rule out bacterial infection or co-infection with other pathogens not detected by the test. Clinical correlation with patient history  and other diagnostic information is necessary to determine patient infection status. The expected result is Negative.  Fact Sheet for Patients: EntrepreneurPulse.com.au  Fact Sheet for Healthcare Providers: IncredibleEmployment.be  This test is not yet approved or cleared by the [place] FDA and  has been authorized for detection and/or diagnosis of SARS-CoV-2 by FDA under an Emergency Use Authorization (EUA).  This EUA will remain in effect (meaning this te st can be used) for the duration of  the COVID-19 declaration under Section 564(b)(1) of the Act, 21 U.S.C. section 360bbb-3(b)(1), unless the authorization is terminated or revoked sooner.     Influenza A by PCR NEGATIVE NEGATIVE Final   Influenza B by PCR NEGATIVE NEGATIVE Final    Comment: (NOTE) The Xpert Xpress SARS-CoV-2/FLU/RSV plus assay is intended as an aid in the diagnosis of influenza from Nasopharyngeal swab specimens and should not be used as a sole basis for treatment. Nasal washings and aspirates are unacceptable for Xpert  Xpress SARS-CoV-2/FLU/RSV testing.  Fact Sheet for Patients: EntrepreneurPulse.com.au  Fact Sheet for Healthcare Providers: IncredibleEmployment.be  This test is not yet approved or cleared by the [place] FDA and has been authorized for detection and/or diagnosis of SARS-CoV-2 by FDA under an Emergency Use Authorization (EUA). This EUA will remain in effect (meaning this test can be used) for the duration of the COVID-19 declaration under Section 564(b)(1) of the Act, 21 U.S.C. section 360bbb-3(b)(1), unless the authorization is terminated or revoked.  Performed at [facility], [place] [address], [place], [place] [address]   Blood Culture (routine x 2)     Status: None (Preliminary result)   Collection Time: 06/23/20 11:13 AM   Specimen: BLOOD RIGHT HAND  Result Value Ref Range Status    Specimen Description   Final    BLOOD RIGHT HAND Performed at [facility], [place] [address] [place], [place] [address]    Special Requests   Final    Blood Culture results may not be optimal due to an inadequate volume of blood received in culture bottles Performed at [facility] [address], [place], [place] [address]    Culture   Final    NO GROWTH 1 DAY Performed at [facility], [place] [address] [place], [place] [address]    Report Status PENDING  Incomplete  Blood Culture (routine x 2)     Status: None (Preliminary result)   Collection Time: 06/23/20 11:18 AM   Specimen: BLOOD  Result Value Ref Range Status   Specimen Description   Final    BLOOD LEFT ANTECUBITAL Performed at [facility] [address] [place], [place] [address]    Special Requests   Final    BOTTLES DRAWN AEROBIC AND ANAEROBIC Blood Culture adequate volume Performed at [facility] [address], [place], [place] [address]    Culture   Final    NO GROWTH 1 DAY Performed at [facility], [place] [address] [place],  [address]    Report Status PENDING  Incomplete      Radiology Studies: No results found.  Scheduled Meds: . vitamin C  500 mg Oral Daily  . aspirin EC  81 mg Oral Daily  . atorvastatin  40 mg Oral QHS  . dexamethasone  6 mg Oral Daily  . enoxaparin (LOVENOX) injection  60 mg Subcutaneous Q24H  . insulin aspart  0-20 Units Subcutaneous TID WC  . insulin aspart  0-5 Units Subcutaneous QHS  . insulin aspart  8 Units Subcutaneous TID WC  . insulin detemir  30 Units Subcutaneous BID  . Ipratropium-Albuterol  1 puff Inhalation TID  . linagliptin  5 mg Oral Daily  . lisinopril  40 mg Oral Daily  . zinc sulfate  220 mg Oral Daily   Continuous Infusions: . remdesivir 100 mg in NS 100 mL 100 mg (06/25/20 0830)     LOS: 2 days   Time spent: 25 minutes.  Patrecia Pour, MD [facility] [email or web address] 06/25/2020, 12:25 PM

## 2020-06-26 DIAGNOSIS — U071 COVID-19: Secondary | ICD-10-CM | POA: Diagnosis not present

## 2020-06-26 DIAGNOSIS — J1282 Pneumonia due to coronavirus disease 2019: Secondary | ICD-10-CM | POA: Diagnosis not present

## 2020-06-26 LAB — GLUCOSE, CAPILLARY
Glucose-Capillary: 281 mg/dL — ABNORMAL HIGH (ref 70–99)
Glucose-Capillary: 95 mg/dL (ref 70–99)

## 2020-06-26 LAB — C-REACTIVE PROTEIN: CRP: 2.7 mg/dL — ABNORMAL HIGH (ref <1.0)

## 2020-06-26 LAB — D-DIMER, QUANTITATIVE: D-Dimer, Quant: 1.12 ug/mL-FEU — ABNORMAL HIGH (ref 0.00–0.50)

## 2020-06-26 NOTE — Progress Notes (Signed)
Patient has DC order and is cleared for DC. Paperwork and education given, ride is available, and patient refused education regarding vaccination. IV removed and patient escorted to car with no issues.

## 2020-06-26 NOTE — Discharge Summary (Signed)
Physician Discharge Summary  Daniel Herring WUJ:[id number] DOB: 11/24/1960 DOA: 06/23/2020  PCP: Loyal JacobsonKalish, Michael, MD  Admit date: 06/23/2020 Discharge date: 06/26/2020  Admitted From: Home Disposition: Home   Recommendations for Outpatient Follow-up:  1. Follow up with PCP in 1-2 weeks with continued management of uncontrolled T2DM.   Home Health: None Equipment/Devices: None Discharge Condition: Stable CODE STATUS: Full Diet recommendation: Carb-modified  Brief/Interim Summary: Daniel ColeStewart Mcveigh is a 60 y.o. male with a history of T2DM, HTN, HLD, and obesity who presented to the ED 06/23/2020 with mild dyspnea and hypoxia on home pulse oximetry after initially testing positive for covid-19 at home 06/16/2020. Hypoxia was confirmed on arrival with bilateral hazy opacities on CXR. Remdesivir and decadron were started and the patient admitted for acute hypoxic respiratory failure due to covid-19 pneumonia. Treatment including steroids has been complicated by severe hyperglycemia requiring initiation and upward titration of insulin. Hypoxia resolved quickly. Steroids were therefore tapered to off due to severe hyperglycemia.   Discharge Diagnoses:  Active Problems:   Pneumonia due to COVID-19 virus  Acute hypoxemic respiratory failure due to covid-19 pneumonia: SARS-CoV-2 Ag positive on 12/31.  - Received remdesivir 1/7 - 1/10. Stop steroids due to hyperglycemia and quick resolution of hypoxia.   T2DM, uncontrolled with steroid-induced hyperglycemia: HbA1c 8.8%. - Restart multiple oral drug combination at home. Anticipate improvement in hyperglycemia since steroids have not been continued. - Knows to follow up with PCP  HTN:  - Lisinopril  HLD:  - Statin  Obesity: Estimated body mass index is 39.31 kg/m as calculated from the following:   Height as of this encounter: 5\' 10"  (1.778 m).   Weight as of this encounter: 124.3 kg.   Discharge Instructions Discharge Instructions    Diet  Carb Modified   Complete by: As directed    Discharge instructions   Complete by: As directed    You are being discharged from the hospital after treatment for covid-19 infection. You are felt to be stable enough to no longer require inpatient monitoring, testing, and treatment, though you will need to follow the recommendations below: - Continue taking medications as you were and checking blood sugars. - Per CDC guidelines, you will need to remain in isolation for 21 days from your first positive covid test. - Follow up with your doctor in the next week via telehealth or seek medical attention right away if your symptoms get WORSE.  - You are still encouraged to get a covid vaccination within the next 90 days (before immunity is thought to wear off).  Directions for you at home:  Wear a facemask You should wear a facemask that covers your nose and mouth when you are in the same room with other people and when you visit a healthcare provider. People who live with or visit you should also wear a facemask while they are in the same room with you.  Separate yourself from other people in your home As much as possible, you should stay in a different room from other people in your home. Also, you should use a separate bathroom, if available.  Avoid sharing household items You should not share dishes, drinking glasses, cups, eating utensils, towels, bedding, or other items with other people in your home. After using these items, you should wash them thoroughly with soap and water.  Cover your coughs and sneezes Cover your mouth and nose with a tissue when you cough or sneeze, or you can cough or sneeze into your sleeve. Throw used  tissues in a lined trash can, and immediately wash your hands with soap and water for at least 20 seconds or use an alcohol-based hand rub.  Wash your [facility] your hands often and thoroughly with soap and water for at least 20 seconds. You can use an  alcohol-based hand sanitizer if soap and water are not available and if your hands are not visibly dirty. Avoid touching your eyes, nose, and mouth with unwashed hands.  Directions for those who live with, or provide care at home for you:  Limit the number of people who have contact with the patient If possible, have only one caregiver for the patient. Other household members should stay in another home or place of residence. If this is not possible, they should stay in another room, or be separated from the patient as much as possible. Use a separate bathroom, if available. Restrict visitors who do not have an essential need to be in the home.  Ensure good ventilation Make sure that shared spaces in the home have good air flow, such as from an air conditioner or an opened window, weather permitting.  Wash your hands often Wash your hands often and thoroughly with soap and water for at least 20 seconds. You can use an alcohol based hand sanitizer if soap and water are not available and if your hands are not visibly dirty. Avoid touching your eyes, nose, and mouth with unwashed hands. Use disposable paper towels to dry your hands. If not available, use dedicated cloth towels and replace them when they become wet.  Wear a facemask and gloves Wear a disposable facemask at all times in the room and gloves when you touch or have contact with the patient's blood, body fluids, and/or secretions or excretions, such as sweat, saliva, sputum, nasal mucus, vomit, urine, or feces.  Ensure the mask fits over your nose and mouth tightly, and do not touch it during use. Throw out disposable facemasks and gloves after using them. Do not reuse. Wash your hands immediately after removing your facemask and gloves. If your personal clothing becomes contaminated, carefully remove clothing and launder. Wash your hands after handling contaminated clothing. Place all used disposable facemasks, gloves, and other  waste in a lined container before disposing them with other household waste. Remove gloves and wash your hands immediately after handling these items.  Do not share dishes, glasses, or other household items with the patient Avoid sharing household items. You should not share dishes, drinking glasses, cups, eating utensils, towels, bedding, or other items with a patient who is confirmed to have, or being evaluated for, COVID-19 infection. After the person uses these items, you should wash them thoroughly with soap and water.  Wash laundry thoroughly Immediately remove and wash clothes or bedding that have blood, body fluids, and/or secretions or excretions, such as sweat, saliva, sputum, nasal mucus, vomit, urine, or feces, on them. Wear gloves when handling laundry from the patient. Read and follow directions on labels of laundry or clothing items and detergent. In general, wash and dry with the warmest temperatures recommended on the label.  Clean all areas the individual has used often Clean all touchable surfaces, such as counters, tabletops, doorknobs, bathroom fixtures, toilets, phones, keyboards, tablets, and bedside tables, every day. Also, clean any surfaces that may have blood, body fluids, and/or secretions or excretions on them. Wear gloves when cleaning surfaces the patient has come in contact with. Use a diluted bleach solution (e.g., dilute bleach with 1 part  bleach and 10 parts water) or a household disinfectant with a label that says EPA-registered for coronaviruses. To make a bleach solution at home, add 1 tablespoon of bleach to 1 quart (4 cups) of water. For a larger supply, add  cup of bleach to 1 gallon (16 cups) of water. Read labels of cleaning products and follow recommendations provided on product labels. Labels contain instructions for safe and effective use of the cleaning product including precautions you should take when applying the product, such as wearing gloves or  eye protection and making sure you have good ventilation during use of the product. Remove gloves and wash hands immediately after cleaning.  Monitor yourself for signs and symptoms of illness Caregivers and household members are considered close contacts, should monitor their health, and will be asked to limit movement outside of the home to the extent possible. Follow the monitoring steps for close contacts listed on the symptom monitoring form.  If you have additional questions, contact your local health department or call the epidemiologist on call at [phone] (available 24/7). This guidance is subject to change. For the most up-to-date guidance from [facility] please refer to their website: YouBlogs.pl   Increase activity slowly   Complete by: As directed    MyChart COVID-19 home monitoring program   Complete by: Jun 26, 2020    Is the patient willing to use the [facility] for home monitoring?: Yes     Allergies as of 06/26/2020   No Known Allergies     Medication List    TAKE these medications   aspirin 81 MG tablet Take 81 mg by mouth daily.   atorvastatin 40 MG tablet Commonly known as: LIPITOR Take 40 mg by mouth at bedtime.   Bydureon BCise 2 MG/0.85ML Au[place] Generic drug: Exenatide ER Inject 2 mg into the skin once a week.   Emergen-C Immune Pack Take 1 packet by mouth daily.   Farxiga 10 MG Tabs tablet Generic drug: dapagliflozin propanediol Take 10 mg by mouth daily.   glipiZIDE 10 MG 24 hr tablet Commonly known as: GLUCOTROL XL Take 10 mg by mouth daily.   lisinopril 40 MG tablet Commonly known as: ZESTRIL Take 40 mg by mouth daily.   metFORMIN 500 MG 24 hr tablet Commonly known as: GLUCOPHAGE-XR Take 1,000 mg by mouth 2 (two) times daily.   ZINC PO Take 1 tablet by mouth daily.       Follow-up Information    Jefm Petty, MD. Schedule an appointment as soon as possible for  a visit in 1 week(s).   Specialty: Family Medicine Contact information: [address] [place] [address] [phone]              No Known Allergies  Consultations:  None  Procedures/Studies: DG Chest Port 1 View  Result Date: 06/23/2020 CLINICAL DATA:  Pt reported on 06/05/20 his spouse tested positive for COVID-19. Pt tested positive on 12/31 with a home test. Pt is not vaccinated against COVID19. Pt reported dyspnea on exertion. History of diabetes and HTN. EXAM: PORTABLE CHEST 1 VIEW COMPARISON:  None. FINDINGS: Cardiac silhouette normal in size.  No mediastinal or hilar masses. There hazy bilateral airspace lung opacities consistent with multifocal pneumonia. No evidence of pulmonary edema. No pleural effusion or pneumothorax. Skeletal structures are grossly intact. IMPRESSION: 1. Bilateral hazy airspace lung opacities consistent with multifocal pneumonia, pattern compatible with COVID-19 infection. Electronically Signed   By: Lajean Manes M.D.   On:  06/23/2020 11:47     Subjective: Feels well, wants to go home. No cough, dyspnea, chest pain  Discharge Exam: Vitals:   06/26/20 0425 06/26/20 0820  BP: 132/71 (!) 142/83  Pulse: 63 66  Resp: 15   Temp: (!) 97.4 F (36.3 C)   SpO2: 94%    General: Pt is alert, awake, not in acute distress Cardiovascular: RRR, S1/S2 +, no rubs, no gallops Respiratory: CTA bilaterally, no wheezing, no rhonchi Abdominal: Soft, NT, ND, bowel sounds + Extremities: No edema, no cyanosis  Labs: BNP (last 3 results) No results for input(s): BNP in the last 8760 hours. Basic Metabolic Panel: Recent Labs  Lab 06/23/20 1113 06/24/20 0805  NA 137 135  K 4.4 4.7  CL 102 102  CO2 23 23  GLUCOSE 267* 270*  BUN 20 22  CREATININE 1.04 0.88  CALCIUM 8.8* 8.9   Liver Function Tests: Recent Labs  Lab 06/23/20 1113 06/24/20 0805  AST 34 29  ALT 34 33  ALKPHOS 57 52  BILITOT 0.6 0.6  PROT 7.1 6.7  ALBUMIN 3.4*  3.1*   No results for input(s): LIPASE, AMYLASE in the last 168 hours. No results for input(s): AMMONIA in the last 168 hours. CBC: Recent Labs  Lab 06/23/20 1113 06/24/20 0248  WBC 7.0 6.1  NEUTROABS 5.3 5.0  HGB 12.1* 11.8*  HCT 36.9* 36.6*  MCV 87.2 87.1  PLT 392 406*   Cardiac Enzymes: No results for input(s): CKTOTAL, CKMB, CKMBINDEX, TROPONINI in the last 168 hours. BNP: Invalid input(s): POCBNP CBG: Recent Labs  Lab 06/25/20 0719 06/25/20 1159 06/25/20 1703 06/25/20 2138 06/26/20 0725  GLUCAP 156* 219* 272* 368* 95   D-Dimer Recent Labs    06/25/20 0245 06/26/20 0243  DDIMER 1.19* 1.12*   Hgb A1c Recent Labs    06/23/20 1113  HGBA1C 8.8*   Lipid Profile Recent Labs    06/23/20 1113  TRIG 156*   Thyroid function studies No results for input(s): TSH, T4TOTAL, T3FREE, THYROIDAB in the last 72 hours.  Invalid input(s): FREET3 Anemia work up Recent Labs    06/23/20 1113 06/24/20 0248  FERRITIN 611* 637*   Urinalysis No results found for: COLORURINE, APPEARANCEUR, LABSPEC, [place], GLUCOSEU, [place], BILIRUBINUR, KETONESUR, PROTEINUR, UROBILINOGEN, NITRITE, [place]  Microbiology Recent Results (from the past 240 hour(s))  Resp Panel by RT-PCR (Flu A&B, Covid) Nasopharyngeal Swab     Status: Abnormal   Collection Time: 06/23/20 11:13 AM   Specimen: Nasopharyngeal Swab; Nasopharyngeal(NP) swabs in vial transport medium  Result Value Ref Range Status   SARS Coronavirus 2 by RT PCR POSITIVE (A) NEGATIVE Final    Comment: RESULT CALLED TO, READ BACK BY AND VERIFIED WITH: GRANT,K. RN @1447  ON 01.07.2022 BY COHEN,K (NOTE) SARS-CoV-2 target nucleic acids are DETECTED.  The SARS-CoV-2 RNA is generally detectable in upper respiratory specimens during the acute phase of infection. Positive results are indicative of the presence of the identified virus, but do not rule out bacterial infection or co-infection with other pathogens not detected by  the test. Clinical correlation with patient history and other diagnostic information is necessary to determine patient infection status. The expected result is Negative.  Fact Sheet for Patients: EntrepreneurPulse.com.au  Fact Sheet for Healthcare Providers: IncredibleEmployment.be  This test is not yet approved or cleared by the [place] FDA and  has been authorized for detection and/or diagnosis of SARS-CoV-2 by FDA under an Emergency Use Authorization (EUA).  This EUA will remain in effect (meaning this te st  can be used) for the duration of  the COVID-19 declaration under Section 564(b)(1) of the Act, 21 U.S.C. section 360bbb-3(b)(1), unless the authorization is terminated or revoked sooner.     Influenza A by PCR NEGATIVE NEGATIVE Final   Influenza B by PCR NEGATIVE NEGATIVE Final    Comment: (NOTE) The Xpert Xpress SARS-CoV-2/FLU/RSV plus assay is intended as an aid in the diagnosis of influenza from Nasopharyngeal swab specimens and should not be used as a sole basis for treatment. Nasal washings and aspirates are unacceptable for Xpert Xpress SARS-CoV-2/FLU/RSV testing.  Fact Sheet for Patients: EntrepreneurPulse.com.au  Fact Sheet for Healthcare Providers: IncredibleEmployment.be  This test is not yet approved or cleared by the [place] FDA and has been authorized for detection and/or diagnosis of SARS-CoV-2 by FDA under an Emergency Use Authorization (EUA). This EUA will remain in effect (meaning this test can be used) for the duration of the COVID-19 declaration under Section 564(b)(1) of the Act, 21 U.S.C. section 360bbb-3(b)(1), unless the authorization is terminated or revoked.  Performed at [facility], [place] [address], [place], [place] [address]   Blood Culture (routine x 2)     Status: None (Preliminary result)   Collection Time: 06/23/20 11:13 AM    Specimen: BLOOD RIGHT HAND  Result Value Ref Range Status   Specimen Description   Final    BLOOD RIGHT HAND Performed at [facility], [place] [address], [place], [place] [address]    Special Requests   Final    Blood Culture results may not be optimal due to an inadequate volume of blood received in culture bottles Performed at [facility] [address] [place], [place] [address]    Culture   Final    NO GROWTH 2 DAYS Performed at [facility] [address], [place], [place] [address]    Report Status PENDING  Incomplete  Blood Culture (routine x 2)     Status: None (Preliminary result)   Collection Time: 06/23/20 11:18 AM   Specimen: BLOOD  Result Value Ref Range Status   Specimen Description   Final    BLOOD LEFT ANTECUBITAL Performed at [facility] [address], [place], [place] [address]    Special Requests   Final    BOTTLES DRAWN AEROBIC AND ANAEROBIC Blood Culture adequate volume Performed at [facility] [address] [place], [place] [address]    Culture   Final    NO GROWTH 2 DAYS Performed at [facility] [address], [place],  [address]    Report Status PENDING  Incomplete    Time coordinating discharge: Approximately 40 minutes  Patrecia Pour, MD  [facility] 06/26/2020, 9:29 AM

## 2020-06-28 LAB — CULTURE, BLOOD (ROUTINE X 2)
Culture: NO GROWTH
Culture: NO GROWTH
Special Requests: ADEQUATE

## 2020-09-14 ENCOUNTER — Other Ambulatory Visit: Payer: Self-pay

## 2020-09-14 ENCOUNTER — Encounter (HOSPITAL_BASED_OUTPATIENT_CLINIC_OR_DEPARTMENT_OTHER): Payer: Self-pay | Admitting: Emergency Medicine

## 2020-09-14 ENCOUNTER — Emergency Department (HOSPITAL_BASED_OUTPATIENT_CLINIC_OR_DEPARTMENT_OTHER)
Admission: EM | Admit: 2020-09-14 | Discharge: 2020-09-14 | Disposition: A | Discharge status: Home / Self Care | Payer: BC Managed Care – PPO | Attending: Emergency Medicine | Admitting: Emergency Medicine

## 2020-09-14 ENCOUNTER — Emergency Department (HOSPITAL_BASED_OUTPATIENT_CLINIC_OR_DEPARTMENT_OTHER): Payer: BC Managed Care – PPO

## 2020-09-14 DIAGNOSIS — E119 Type 2 diabetes mellitus without complications: Secondary | ICD-10-CM | POA: Insufficient documentation

## 2020-09-14 DIAGNOSIS — Z8616 Personal history of COVID-19: Secondary | ICD-10-CM | POA: Insufficient documentation

## 2020-09-14 DIAGNOSIS — Z7984 Long term (current) use of oral hypoglycemic drugs: Secondary | ICD-10-CM | POA: Insufficient documentation

## 2020-09-14 DIAGNOSIS — Z79899 Other long term (current) drug therapy: Secondary | ICD-10-CM | POA: Insufficient documentation

## 2020-09-14 DIAGNOSIS — I10 Essential (primary) hypertension: Secondary | ICD-10-CM | POA: Diagnosis not present

## 2020-09-14 DIAGNOSIS — R310 Gross hematuria: Secondary | ICD-10-CM

## 2020-09-14 DIAGNOSIS — I700 Atherosclerosis of aorta: Secondary | ICD-10-CM | POA: Insufficient documentation

## 2020-09-14 DIAGNOSIS — K5790 Diverticulosis of intestine, part unspecified, without perforation or abscess without bleeding: Secondary | ICD-10-CM | POA: Diagnosis not present

## 2020-09-14 DIAGNOSIS — Z7982 Long term (current) use of aspirin: Secondary | ICD-10-CM | POA: Diagnosis not present

## 2020-09-14 DIAGNOSIS — N2889 Other specified disorders of kidney and ureter: Secondary | ICD-10-CM | POA: Diagnosis not present

## 2020-09-14 DIAGNOSIS — I7090 Unspecified atherosclerosis: Secondary | ICD-10-CM

## 2020-09-14 DIAGNOSIS — R319 Hematuria, unspecified: Secondary | ICD-10-CM | POA: Diagnosis present

## 2020-09-14 LAB — CBC WITH DIFFERENTIAL/PLATELET
Abs Immature Granulocytes: 0.02 10*3/uL (ref 0.00–0.07)
Basophils Absolute: 0.1 10*3/uL (ref 0.0–0.1)
Basophils Relative: 1 %
Eosinophils Absolute: 0.2 10*3/uL (ref 0.0–0.5)
Eosinophils Relative: 3 %
HCT: 38.1 % — ABNORMAL LOW (ref 39.0–52.0)
Hemoglobin: 12.8 g/dL — ABNORMAL LOW (ref 13.0–17.0)
Immature Granulocytes: 0 %
Lymphocytes Relative: 26 %
Lymphs Abs: 2.5 10*3/uL (ref 0.7–4.0)
MCH: 28.4 pg (ref 26.0–34.0)
MCHC: 33.6 g/dL (ref 30.0–36.0)
MCV: 84.5 fL (ref 80.0–100.0)
Monocytes Absolute: 0.9 10*3/uL (ref 0.1–1.0)
Monocytes Relative: 9 %
Neutro Abs: 6 10*3/uL (ref 1.7–7.7)
Neutrophils Relative %: 61 %
Platelets: 339 10*3/uL (ref 150–400)
RBC: 4.51 MIL/uL (ref 4.22–5.81)
RDW: 14.4 % (ref 11.5–15.5)
WBC: 9.7 10*3/uL (ref 4.0–10.5)
nRBC: 0 % (ref 0.0–0.2)

## 2020-09-14 LAB — COMPREHENSIVE METABOLIC PANEL
ALT: 21 U/L (ref 0–44)
AST: 19 U/L (ref 15–41)
Albumin: 3.8 g/dL (ref 3.5–5.0)
Alkaline Phosphatase: 67 U/L (ref 38–126)
Anion gap: 10 (ref 5–15)
BUN: 33 mg/dL — ABNORMAL HIGH (ref 8–23)
CO2: 22 mmol/L (ref 22–32)
Calcium: 9.4 mg/dL (ref 8.9–10.3)
Chloride: 104 mmol/L (ref 98–111)
Creatinine, Ser: 1.35 mg/dL — ABNORMAL HIGH (ref 0.61–1.24)
GFR, Estimated: 60 mL/min — ABNORMAL LOW (ref >60)
Glucose, Bld: 142 mg/dL — ABNORMAL HIGH (ref 70–99)
Potassium: 4.6 mmol/L (ref 3.5–5.1)
Sodium: 136 mmol/L (ref 135–145)
Total Bilirubin: 0.4 mg/dL (ref 0.3–1.2)
Total Protein: 7.2 g/dL (ref 6.5–8.1)

## 2020-09-14 LAB — URINALYSIS, ROUTINE W REFLEX MICROSCOPIC
Glucose, UA: >=500 mg/dL — AB
Ketones, ur: NEGATIVE mg/dL
Nitrite: NEGATIVE
Protein, ur: 100 mg/dL — AB
Specific Gravity, Urine: 1.02 (ref 1.005–1.030)
pH: 5.5 (ref 5.0–8.0)

## 2020-09-14 LAB — URINALYSIS, MICROSCOPIC (REFLEX): RBC / HPF: >50 RBC/hpf (ref 0–5)

## 2020-09-14 LAB — LIPASE, BLOOD: Lipase: 65 U/L — ABNORMAL HIGH (ref 11–51)

## 2020-09-14 NOTE — ED Provider Notes (Signed)
[facility] Provider Note   CSN: [id number] Arrival date & time: 09/14/20  1000     History Chief Complaint  Patient presents with  . Hematuria    Daniel Herring is a 60 y.o. male history diabetes hypertension hyperlipidemia obesity.  Patient presents for painless hematuria onset 1 week ago noticed small amount of blood in his urine, that cleared up over the next day.  Few days ago he notes it returned before again resolving spontaneously.  Last night he noticed much darker blood in his urine which has been present since last night.  Associated symptoms are urinary hesitancy.  He reports history of kidney stone reports he feels symptoms are very similar.  Patient reports that he went to his works clinic yesterday, they told him his left testicle appeared larger than his right, he is unsure how long this has been present, reports history of vasectomy in the past.  Denies fever/chills, chest pain, abdominal pain, nausea/vomiting, flank pain/back pain, testicle pain, dysuria/hematuria, concern for STI, rash or any additional concerns  HPI     Past Medical History:  Diagnosis Date  . Diabetes mellitus without complication ([place])   . Hyperlipidemia   . Hypertension     Patient Active Problem List   Diagnosis Date Noted  . Pneumonia due to COVID-19 virus 06/23/2020    Past Surgical History:  Procedure Laterality Date  . VASECTOMY         Family History  Problem Relation Age of Onset  . Heart disease Other   . Hyperlipidemia Other   . Hypertension Other   . Diabetes Other     Social History   Tobacco Use  . Smoking status: Never Smoker  . Smokeless tobacco: Never Used  Vaping Use  . Vaping Use: Never used  Substance Use Topics  . Alcohol use: Never  . Drug use: Never    Home Medications Prior to Admission medications   Medication Sig Start Date End Date Taking? Authorizing Provider  aspirin 81 MG tablet Take 81 mg by mouth  daily.   Yes [provider]  BYDUREON BCISE 2 MG/0.85ML AUIJ Inject 2 mg into the skin once a week. 05/08/20  Yes [provider]  FARXIGA 10 MG TABS tablet Take 10 mg by mouth daily. 05/26/20  Yes [provider]  glipiZIDE (GLUCOTROL XL) 10 MG 24 hr tablet Take 10 mg by mouth daily. 05/02/20  Yes [provider]  lisinopril (ZESTRIL) 40 MG tablet Take 40 mg by mouth daily. 05/26/20  Yes [provider]  metFORMIN (GLUCOPHAGE-XR) 500 MG 24 hr tablet Take 1,000 mg by mouth 2 (two) times daily. 05/05/20  Yes [provider]  Multiple Vitamins-Minerals (EMERGEN-C IMMUNE) PACK Take 1 packet by mouth daily.   Yes [provider]  atorvastatin (LIPITOR) 40 MG tablet Take 40 mg by mouth at bedtime. 04/05/20   [provider]  Multiple Vitamins-Minerals (ZINC PO) Take 1 tablet by mouth daily.    [provider]  pioglitazone (ACTOS) 15 MG tablet Take 15 mg by mouth daily. 08/02/20   [provider]  pravastatin (PRAVACHOL) 80 MG tablet Take 80 mg by mouth daily. 08/02/20   [provider]    Allergies    Patient has no known allergies.  Review of Systems   Review of Systems Ten systems are reviewed and are negative for acute change except as noted in the HPI  Physical Exam Updated Vital Signs BP 136/71 (BP Location:  Left Arm)   Pulse 74   Temp 98.1 F (36.7 C) (Oral)   Resp 20   Ht 5\' 10"  (1.778 m)   Wt 124.7 kg   SpO2 95%   BMI 39.46 kg/m   Physical Exam Constitutional:      General: He is not in acute distress.    Appearance: Normal appearance. He is well-developed. He is not ill-appearing or diaphoretic.  HENT:     Head: Normocephalic and atraumatic.  Eyes:     General: Vision grossly intact. Gaze aligned appropriately.     Pupils: Pupils are equal, round, and reactive to light.  Neck:     Trachea: Trachea and phonation normal.  Pulmonary:     Effort: Pulmonary effort is normal.  No respiratory distress.  Abdominal:     General: There is no distension.     Palpations: Abdomen is soft.     Tenderness: There is no abdominal tenderness. There is no guarding or rebound.  Genitourinary:    Comments: GU examination chaperoned by Sophie RN  No external genital lesions noted, no bumps on head of penis, specifically no vesicles concerning for herpes or chancre suggestive of syphilis.  No pain with palpation of the penis/glans, no discharge or urethritis noted.  Scrotum and testicles without erythema or tenderness to palpation.  Left testicle enlarged and irregular in shape. Cremasteric reflex intact bilaterally. No palpable hernia noted.  Musculoskeletal:        General: Normal range of motion.     Cervical back: Normal range of motion.  Skin:    General: Skin is warm and dry.  Neurological:     Mental Status: He is alert.     GCS: GCS eye subscore is 4. GCS verbal subscore is 5. GCS motor subscore is 6.     Comments: Speech is clear and goal oriented, follows commands Major Cranial nerves without deficit, no facial droop Moves extremities without ataxia, coordination intact  Psychiatric:        Behavior: Behavior normal.     ED Results / Procedures / Treatments   Labs (all labs ordered are listed, but only abnormal results are displayed) Labs Reviewed  URINALYSIS, ROUTINE W REFLEX MICROSCOPIC - Abnormal; Notable for the following components:      Result Value   Color, Urine BROWN (*)    APPearance CLOUDY (*)    Glucose, UA >=500 (*)    Hgb urine dipstick LARGE (*)    Bilirubin Urine SMALL (*)    Protein, ur 100 (*)    Leukocytes,Ua TRACE (*)    All other components within normal limits  CBC WITH DIFFERENTIAL/PLATELET - Abnormal; Notable for the following components:   Hemoglobin 12.8 (*)    HCT 38.1 (*)    All other components within normal limits  COMPREHENSIVE METABOLIC PANEL - Abnormal; Notable for the following components:   Glucose, Bld 142 (*)     BUN 33 (*)    Creatinine, Ser 1.35 (*)    GFR, Estimated 60 (*)    All other components within normal limits  LIPASE, BLOOD - Abnormal; Notable for the following components:   Lipase 65 (*)    All other components within normal limits  URINALYSIS, MICROSCOPIC (REFLEX) - Abnormal; Notable for the following components:   Bacteria, UA RARE (*)    All other components within normal limits    EKG None  Radiology CT Renal Stone Study  Result Date: 09/14/2020 CLINICAL DATA:  Flank pain and hematuria EXAM:  CT ABDOMEN AND PELVIS WITHOUT CONTRAST TECHNIQUE: Multidetector CT imaging of the abdomen and pelvis was performed following the standard protocol without oral or IV contrast. COMPARISON:  None. FINDINGS: Lower chest: Lung bases are clear. Hepatobiliary: No focal liver lesions are appreciable on this noncontrast enhanced study. Gallbladder wall is not appreciably thickened. There is no biliary duct dilatation. Pancreas: There is no pancreatic mass or inflammatory focus. Spleen: No splenic lesions are evident. Adrenals/Urinary Tract: Adrenals bilaterally appear normal. There is a mass arising from the left kidney. This mass arises from the mid and inferior aspect of the kidney. This solid mass measures 12.7 cm from superior to inferior dimension. It measures 8.5 cm from right to left dimension and 8.9 cm from anterior to posterior dimension. There is apparent extension of mass into the left renal vein. It does not definitely extend to the inferior vena cava based on noncontrast enhanced study. There is soft tissue stranding in the left perinephric region. There is no appreciable renal mass on the right. On the right, there are scattered 1-2 mm calculi. There is a 2 mm calculus in the lateral mid left kidney. No ureteral calculi evident on either side. There is apparent debris along the posterior aspect of the urinary bladder with localized wall thickening in this area. Urinary bladder otherwise appears  unremarkable. Stomach/Bowel: There are sigmoid diverticula without diverticulitis. There is no appreciable bowel wall or mesenteric thickening. No evident bowel obstruction. Terminal ileum appears normal. Appendix appears normal. No evident free air or portal venous air. Vascular/Lymphatic: No abdominal aortic aneurysm. Calcification is noted in the aorta and common iliac arteries bilaterally. There are subcentimeter lymph nodes immediately adjacent to the left renal mass medially. No enlarged lymph nodes seen in the abdomen or pelvis by size criteria. Reproductive: Prostate and seminal vesicles normal in size and contour. No scrotal region mass appreciable on noncontrast enhanced CT. Other: There is fat in each inguinal ring. No abscess or ascites in the abdomen or pelvis. Musculoskeletal: No blastic or lytic bone lesions. Probable subcentimeter bone island in the left iliac crest slightly lateral to the sacroiliac joint. No intramuscular lesions. IMPRESSION: 1. 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 Tumor does not convincingly reach the inferior vena cava on noncontrast enhanced study. Further assessment of this mass will warrant either MR or CT pre and post-contrast. Note that there is soft tissue stranding in the left perinephric region with several lymph nodes medial to this left renal mass. These lymph nodes may well have neoplastic etiology although they are not overtly enlarged at this time. 2. Sigmoid diverticula without diverticulitis. No bowel wall thickening or bowel obstruction. No abscess in the abdomen or pelvis. Appendix appears normal. 3.  Aortic Atherosclerosis (ICD10-I70.0). Electronically Signed   By: Lowella Grip III M.D.   On: 09/14/2020 11:15    Procedures Procedures   Medications Ordered in ED Medications - No data to display  ED Course  I have reviewed the triage  vital signs and the nursing notes.  Pertinent labs & imaging results that were available during my care of the patient were reviewed by me and considered in my medical decision making (see chart for details).  Clinical Course as of 09/14/20 1149  Thu Sep 14, 2020  1127 Dr. Abner Greenspan [BM]    Clinical Course User Index [BM] Deliah Boston, PA-C  MDM Rules/Calculators/A&P                          Additional history obtained from: 1. Nursing notes from this visit. 2. Review of electronic medical records patient seen today at internal medicine, diagnosis gross hematuria, swelling of the left half of the scrotum, urinary dribbling.  Referred to ER by FNP for lab/imaging, no other visible notes. ---------------- 60 year old male presented to the ER for evaluation of hematuria and urinary hesitancy onset a week ago problem is been intermittent.  Not associated with any pain.  Additionally left testicular mass noted at outpatient clinic today.  On evaluation patient is well-appearing no acute distress vital signs are stable.  No abdominal tenderness or CVA tenderness.  GU exam is significant for left testicular mass no tenderness to suggest epididymitis/orchitis or other infectious process.  Will obtain basic labs and CT renal stone study. ============= I ordered, reviewed and interpreted labs which include: CBC with hallux ptosis to suggest infectious process, mild anemia of 12.8 similar to prior.  No thrombocytopenia. CMP shows mild elevation of creatinine at 1.35, BUN 33.  Previous creatinines were 0.88 and 1.04.  Encouraged fluids.  No emergent electrolyte derangement, LFT elevations or gap.  Normal bicarb. Urinalysis shows large hemoglobin, glucose and RBCs.  Rare bacteria, no nitrites.  Low suspicion for urinary tract infection. Lipase slightly elevated at 65, no priors to compare. Low suspicion for pancreatitis given lack of symptoms  CT renal stone study: IMPRESSION:  1. Solid renal mass  arising from the mid to lower left kidney  measuring 12.7 x 8.5 x 8.9 cm with apparent invasion of tumor into  the left renal vein by CT. Tumor does not convincingly reach the  inferior vena cava on noncontrast enhanced study. Further assessment  of this mass will warrant either MR or CT pre and post-contrast.  Note that there is soft tissue stranding in the left perinephric  region with several lymph nodes medial to this left renal mass.  These lymph nodes may well have neoplastic etiology although they  are not overtly enlarged at this time.    2. Sigmoid diverticula without diverticulitis. No bowel wall  thickening or bowel obstruction. No abscess in the abdomen or  pelvis. Appendix appears normal.    3. Aortic Atherosclerosis (ICD10-I70.0).  ------------- 11:27 PM: Consult with urologist Dr. Abner Greenspan.  Discussed patient's history, labs/creatinine as well as imaging.  Dr. Abner Greenspan advises patient may be discharged home with his office his information, patient is to call his office today to schedule follow-up appointment in the near future. - Patient was updated on CT findings today as well as need for further work-up and close urology follow-up.  Long discussion held, patient stated understanding of findings today and need for follow-up.  Patient plans to call urology office as soon as he was discharged.  Additionally discussed potential ultrasound of the scrotum to assess for possible mass of the left testicle not seen on CT scan today.  Patient the normal ultrasound and wishes to instead follow-up with urologist which I feel is a reasonable course at this time low suspicion for epididymoorchitis or torsion at this time and close urology follow-up is recommended.  At this time there does not appear to be any evidence of an acute emergency medical condition and the patient appears stable for discharge with appropriate outpatient follow up. Diagnosis was discussed with patient who verbalizes  understanding of care plan and is agreeable  to discharge. I have discussed return precautions with patient who verbalizes understanding. Patient encouraged to follow-up with their PCP and Dr. Abner Greenspan. All questions answered.  Patient's case discussed with Dr. Gilford Raid who agrees with plan to discharge with follow-up.   Note: Portions of this report may have been transcribed using voice recognition software. Every effort was made to ensure accuracy; however, inadvertent computerized transcription errors may still be present.  Final Clinical Impression(s) / ED Diagnoses Final diagnoses:  Renal mass  Diverticulosis  Atherosclerosis    Rx / DC Orders ED Discharge Orders    None       Gari Crown 09/14/20 1159    Isla Pence, MD 09/14/20 1401

## 2020-09-14 NOTE — ED Notes (Signed)
Patient transported to CT 

## 2020-09-14 NOTE — ED Triage Notes (Signed)
Hematuria intermittent x 1 week

## 2020-09-14 NOTE — ED Notes (Signed)
Went in with PA to speak to pt about dx and findings on CT scan and his next steps to take after d/c for his health , pt states understanding of importance of f/u with urologist

## 2020-09-14 NOTE — Discharge Instructions (Addendum)
At this time there does not appear to be the presence of an emergent medical condition, however there is always the potential for conditions to change. Please read and follow the below instructions.  Please return to the Emergency Department immediately for any new or worsening symptoms. Please be sure to follow up with your Primary Care Provider within one week regarding your visit today; please call their office to schedule an appointment even if you are feeling better for a follow-up visit. Please call the urologist Dr. Abner Greenspan today to schedule a follow-up appointment regarding your kidney mass and left testicular abnormality. Additionally your CT scan today showed atherosclerosis and diverticulosis.  Please discuss these incidental findings with your primary care provider at your follow-up appointment.  Your CT scan did show a few small kidney stones that are within your kidney, these are also important to discuss with your urologist at your follow-up appointment.  Go to the nearest Emergency Department immediately if: You have fever or chills You have abdominal pain or vomiting You cannot urinate. You have chest pain. You have trouble breathing. You have any new/concerning or worsening of symptoms  Please read the additional information packets attached to your discharge summary.  Do not take your medicine if  develop an itchy rash, swelling in your mouth or lips, or difficulty breathing; call 911 and seek immediate emergency medical attention if this occurs.  You may review your lab tests and imaging results in their entirety on your MyChart account.  Please discuss all results of fully with your primary care provider and other specialist at your follow-up visit.  Note: Portions of this text may have been transcribed using voice recognition software. Every effort was made to ensure accuracy; however, inadvertent computerized transcription errors may still be present.

## 2020-09-14 NOTE — ED Notes (Signed)
ED Provider at bedside. 

## 2020-09-19 ENCOUNTER — Other Ambulatory Visit (HOSPITAL_COMMUNITY): Payer: Self-pay | Admitting: Urology

## 2020-09-19 ENCOUNTER — Other Ambulatory Visit: Payer: Self-pay | Admitting: Urology

## 2020-09-19 DIAGNOSIS — N2889 Other specified disorders of kidney and ureter: Secondary | ICD-10-CM

## 2020-09-20 ENCOUNTER — Ambulatory Visit (HOSPITAL_COMMUNITY)
Admission: RE | Admit: 2020-09-20 | Discharge: 2020-09-20 | Disposition: A | Discharge status: Home / Self Care | Payer: BC Managed Care – PPO | Source: Ambulatory Visit | Attending: Urology | Admitting: Urology

## 2020-09-20 ENCOUNTER — Other Ambulatory Visit: Payer: Self-pay

## 2020-09-20 DIAGNOSIS — N2889 Other specified disorders of kidney and ureter: Secondary | ICD-10-CM

## 2020-09-20 MED ORDER — GADOBUTROL 1 MMOL/ML IV SOLN
10.0000 mL | Freq: Once | INTRAVENOUS | Status: AC | PRN
  Administered 2020-09-20: 10 mL via INTRAVENOUS

## 2020-09-21 LAB — URINALYSIS
Bilirubin, Urine: NEGATIVE
Glucose, Ur: NEGATIVE
Ketones, Urine: NEGATIVE
Leukocyte Esterase, Urine: NEGATIVE
Nitrite, Urine: NEGATIVE
Specific Gravity, Urine: 1.025
Urobilinogen, Urine: NORMAL
pH, UA: 6 (ref 4.5–8.0)

## 2020-09-25 LAB — CBC
Hematocrit: 37.7 % — AB (ref 41–53)
Hemoglobin: 12.3 g/dL — AB (ref 13.5–17.5)
Platelets: 384 K/??L
RBC: 4.35 10^6/??L
WBC: 10.6 10^3/mL

## 2020-09-25 LAB — BASIC METABOLIC PANEL
BUN: 29 mg/dL
CO2: 28 mmol/L
Calcium: 10.1 mg/dL
Chloride: 103 mmol/L
Creatinine: 1
Gfr Calculated: 80.9
Glucose: 168 mg/dL
Potassium: 4.7 mmol/L
Sodium: 137 mmol/L

## 2020-09-26 ENCOUNTER — Encounter (HOSPITAL_COMMUNITY): Payer: Self-pay | Admitting: Radiology

## 2020-09-26 ENCOUNTER — Other Ambulatory Visit (HOSPITAL_COMMUNITY): Payer: Self-pay | Admitting: Urology

## 2020-09-26 DIAGNOSIS — N2889 Other specified disorders of kidney and ureter: Secondary | ICD-10-CM

## 2020-09-26 DIAGNOSIS — D49512 Neoplasm of unspecified behavior of left kidney: Secondary | ICD-10-CM

## 2020-09-26 NOTE — Progress Notes (Signed)
Cope Marte Male, 60 y.o., 1961/03/13  MRN:  [id number] Phone:  [phone] (M) ...       PCP:  [place], [place] Coverage:  [employer]            RE: [place] Biospy (Kidney) Received: Today Markus Daft, MD  Arlyn Leak for US guided left renal biopsy. Spoke to Dr. Abner Greenspan and patient is requesting biopsy prior to surgery.   [place]        Previous Messages   ----- Message -----  From: Garth Bigness D  Sent: 09/26/2020  8:51 AM EDT  To: [facility] Procedure Requests  Subject: [place] Biospy (Kidney)                Procedure:  US Biopsy (Kidney)   Reason: Neoplasm of left kidney, renal mass   History:  CT, MR in computer   Provider: Janith Lima   Provider Contact: [phone]

## 2020-10-05 ENCOUNTER — Other Ambulatory Visit: Payer: Self-pay | Admitting: Radiology

## 2020-10-09 ENCOUNTER — Other Ambulatory Visit (HOSPITAL_COMMUNITY): Payer: Self-pay | Admitting: Urology

## 2020-10-09 ENCOUNTER — Ambulatory Visit (HOSPITAL_COMMUNITY)
Admission: RE | Admit: 2020-10-09 | Discharge: 2020-10-09 | Disposition: A | Discharge status: Home / Self Care | Payer: BC Managed Care – PPO | Source: Ambulatory Visit | Attending: Urology | Admitting: Urology

## 2020-10-09 ENCOUNTER — Ambulatory Visit (HOSPITAL_COMMUNITY)
Admission: RE | Admit: 2020-10-09 | Discharge: 2020-10-09 | Disposition: A | Discharge status: Home / Self Care | Payer: BC Managed Care – PPO | Source: Ambulatory Visit | Attending: Family Medicine | Admitting: Family Medicine

## 2020-10-09 ENCOUNTER — Encounter (HOSPITAL_COMMUNITY): Payer: Self-pay

## 2020-10-09 ENCOUNTER — Other Ambulatory Visit (HOSPITAL_COMMUNITY): Payer: Self-pay | Admitting: Family Medicine

## 2020-10-09 ENCOUNTER — Other Ambulatory Visit: Payer: Self-pay

## 2020-10-09 DIAGNOSIS — N2889 Other specified disorders of kidney and ureter: Secondary | ICD-10-CM | POA: Insufficient documentation

## 2020-10-09 DIAGNOSIS — D49512 Neoplasm of unspecified behavior of left kidney: Secondary | ICD-10-CM

## 2020-10-09 DIAGNOSIS — C649 Malignant neoplasm of unspecified kidney, except renal pelvis: Secondary | ICD-10-CM | POA: Diagnosis not present

## 2020-10-09 LAB — PROTIME-INR
INR: 1 (ref 0.8–1.2)
Prothrombin Time: 13 seconds (ref 11.4–15.2)

## 2020-10-09 LAB — GLUCOSE, CAPILLARY: Glucose-Capillary: 142 mg/dL — ABNORMAL HIGH (ref 70–99)

## 2020-10-09 LAB — CBC
HCT: 37.7 % — ABNORMAL LOW (ref 39.0–52.0)
Hemoglobin: 12.3 g/dL — ABNORMAL LOW (ref 13.0–17.0)
MCH: 27.4 pg (ref 26.0–34.0)
MCHC: 32.6 g/dL (ref 30.0–36.0)
MCV: 84 fL (ref 80.0–100.0)
Platelets: 371 10*3/uL (ref 150–400)
RBC: 4.49 MIL/uL (ref 4.22–5.81)
RDW: 13.9 % (ref 11.5–15.5)
WBC: 9.9 10*3/uL (ref 4.0–10.5)
nRBC: 0 % (ref 0.0–0.2)

## 2020-10-09 MED ORDER — LIDOCAINE HCL (PF) 1 % IJ SOLN
INTRAMUSCULAR | Status: AC
  Filled 2020-10-09: qty 30

## 2020-10-09 MED ORDER — MIDAZOLAM HCL 2 MG/2ML IJ SOLN
INTRAMUSCULAR | Status: AC
  Filled 2020-10-09: qty 2

## 2020-10-09 MED ORDER — FENTANYL CITRATE (PF) 100 MCG/2ML IJ SOLN
INTRAMUSCULAR | Status: AC
  Filled 2020-10-09: qty 2

## 2020-10-09 MED ORDER — SODIUM CHLORIDE 0.9 % IV SOLN: INTRAVENOUS | Status: DC

## 2020-10-09 MED ORDER — FENTANYL CITRATE (PF) 100 MCG/2ML IJ SOLN
INTRAMUSCULAR | Status: AC | PRN
  Administered 2020-10-09: 12.5 ug via INTRAVENOUS
  Administered 2020-10-09 (×2): 25 ug via INTRAVENOUS
  Administered 2020-10-09: 50 ug via INTRAVENOUS
  Administered 2020-10-09: 12.5 ug via INTRAVENOUS

## 2020-10-09 MED ORDER — GELATIN ABSORBABLE 12-7 MM EX MISC
CUTANEOUS | Status: AC
  Filled 2020-10-09: qty 1

## 2020-10-09 MED ORDER — MIDAZOLAM HCL 2 MG/2ML IJ SOLN
INTRAMUSCULAR | Status: AC | PRN
  Administered 2020-10-09: 1 mg via INTRAVENOUS
  Administered 2020-10-09 (×3): 0.5 mg via INTRAVENOUS

## 2020-10-09 NOTE — Discharge Instructions (Addendum)
Percutaneous Kidney Biopsy, Care After This sheet gives you information about how to care for yourself after your procedure. Your health care provider may also give you more specific instructions. If you have problems or questions, contact your health care provider. What can I expect after the procedure? After the procedure, it is common to have:  Pain or soreness near the biopsy site.  Pink or cloudy urine for 24 hours after the procedure. This is normal. Follow these instructions at home: Activity  Return to your normal activities as told by your health care provider. Ask your health care provider what activities are safe for you.  If you were given a sedative during the procedure, it can affect you for several hours. Do not drive or operate machinery until your health care provider says that it is safe.  Do not lift anything that is heavier than 10 lb (4.5 kg), or the limit that you are told, until your health care provider says that it is safe.  Avoid activities that take a lot of effort until your health care provider approves. Most people will have to wait 2 weeks before returning to activities such as exercise or sex. General instructions  Take over-the-counter and prescription medicines only as told by your health care provider.  Follow instructions from your health care provider about eating or drinking restrictions.  Check your biopsy site every day for signs of infection. Check for: ? More redness, swelling, or pain. ? Fluid or blood. ? Warmth. ? Pus or a bad smell.  Keep all follow-up visits as told by your health care provider. This is important.   Contact a health care provider if:  You have more redness, swelling, or pain around your biopsy site.  You have fluid or blood coming from your biopsy site.  Your biopsy site feels warm to the touch.  You have pus or a bad smell coming from your biopsy site.  You have blood in your urine more than 24 hours after your  procedure. Get help right away if:  Your urine is dark red or brown.  You have a fever.  You are not able to urinate.  You feel burning when you urinate.  You feel dizzy or light-headed.  You have severe pain in your abdomen or side. Summary  After the procedure, it is common to have pain or soreness at the biopsy site and pink or cloudy urine for the first 24 hours.  Check your biopsy site each day for signs of infection, such as more redness, swelling, or pain; fluid, blood, pus or a bad smell coming from the biopsy site; or the biopsy site feeling warm to the touch.  Return to your normal activities as told by your health care provider. This information is not intended to replace advice given to you by your health care provider. Make sure you discuss any questions you have with your health care provider. Document Revised: 08/27/2019 Document Reviewed: 08/27/2019 Elsevier Patient Education  2021 Elsevier Inc. Moderate Conscious Sedation, Adult Sedation is the use of medicines to promote relaxation and to relieve discomfort and anxiety. Moderate conscious sedation is a type of sedation. Under moderate conscious sedation, you are less alert than normal, but you are still able to respond to instructions, touch, or both. Moderate conscious sedation is used during short medical and dental procedures. It is milder than deep sedation, which is a type of sedation under which you cannot be easily woken up. It is also milder than   general anesthesia, which is the use of medicines to make you unconscious. Moderate conscious sedation allows you to return to your regular activities sooner. Tell a health care provider about:  Any allergies you have.  All medicines you are taking, including vitamins, herbs, eye drops, creams, and over-the-counter medicines.  Any use of steroids. This includes steroids taken by mouth or as a cream.  Any problems you or family members have had with sedatives and  anesthetic medicines.  Any blood disorders you have.  Any surgeries you have had.  Any medical conditions you have, such as sleep apnea.  Whether you are pregnant or may be pregnant.  Any use of cigarettes, alcohol, marijuana, or drugs. What are the risks? Generally, this is a safe procedure. However, problems may occur, including:  Getting too much medicine (oversedation).  Nausea.  Allergic reaction to medicines.  Trouble breathing. If this happens, a breathing tube may be used. It will be removed when you are awake and breathing on your own.  Heart trouble.  Lung trouble.  Confusion that gets better with time (emergence delirium). What happens before the procedure? Staying hydrated Follow instructions from your health care provider about hydration, which may include:  Up to 2 hours before the procedure - you may continue to drink clear liquids, such as water, clear fruit juice, black coffee, and plain tea. Eating and drinking restrictions Follow instructions from your health care provider about eating and drinking, which may include:  8 hours before the procedure - stop eating heavy meals or foods, such as meat, fried foods, or fatty foods.  6 hours before the procedure - stop eating light meals or foods, such as toast or cereal.  6 hours before the procedure - stop drinking milk or drinks that contain milk.  2 hours before the procedure - stop drinking clear liquids. Medicines Ask your health care provider about:  Changing or stopping your regular medicines. This is especially important if you are taking diabetes medicines or blood thinners.  Taking medicines such as aspirin and ibuprofen. These medicines can thin your blood. Do not take these medicines unless your health care provider tells you to take them.  Taking over-the-counter medicines, vitamins, herbs, and supplements. Tests and exams  You will have a physical exam.  You may have blood tests done to  show how well: ? Your kidneys and liver work. ? Your blood clots. General instructions  Plan to have a responsible adult take you home from the hospital or clinic.  If you will be going home right after the procedure, plan to have a responsible adult care for you for the time you are told. This is important. What happens during the procedure?  You will be given the sedative. The sedative may be given: ? As a pill that you will swallow. It can also be inserted into the rectum. ? As a spray through the nose. ? As an injection into the muscle. ? As an injection into the vein through an IV.  You may be given oxygen as needed.  Your breathing, heart rate, and blood pressure will be monitored during the procedure.  The medical or dental procedure will be done. The procedure may vary among health care providers and hospitals.   What happens after the procedure?  Your blood pressure, heart rate, breathing rate, and blood oxygen level will be monitored until you leave the hospital or clinic.  You will get fluids through your IV if needed.  Do not drive   or operate machinery until your health care provider says that it is safe. Summary  Sedation is the use of medicines to promote relaxation and to relieve discomfort and anxiety. Moderate conscious sedation is a type of sedation that is used during short medical and dental procedures.  Tell the health care provider about any medical conditions that you have and about all the medicines that you are taking.  You will be given the sedative as a pill, a spray through the nose, an injection into the muscle, or an injection into the vein through an IV. Vital signs are monitored during the sedation.  Moderate conscious sedation allows you to return to your regular activities sooner. This information is not intended to replace advice given to you by your health care provider. Make sure you discuss any questions you have with your health care  provider. Document Revised: 10/01/2019 Document Reviewed: 04/29/2019 Elsevier Patient Education  2021 Elsevier Inc.  

## 2020-10-09 NOTE — Sedation Documentation (Signed)
Per Dr. Dwaine Gale, patient will need to be transitioned to CT scan to perform biopsy. Patient to remain on monitor and nasal cannula. CT notified of changes

## 2020-10-09 NOTE — Progress Notes (Signed)
Discharge instructions reviewed with pt  And his wife, both voice understanding.

## 2020-10-09 NOTE — Procedures (Signed)
Interventional Radiology Procedure Note  Procedure: Renal mass biopsy  Indication: Left renal mass  Findings: Please refer to procedural dictation for full description.  Complications: None  EBL: < 10 mL  Miachel Roux, MD [phone]

## 2020-10-09 NOTE — Sedation Documentation (Signed)
Bx site clean, dry and intact. No drainage from dressing noted. Soft to palpation. Pressure on left side via towels and position of comfort

## 2020-10-09 NOTE — Sedation Documentation (Signed)
Times for procedure  [place]- 15 min CT- 16 min Total sedation time- 31 min  Total overall time 104 min Patient in radiology nurses station between procedures due to availability of [place]. Close observation via monitoring due to sedation given.

## 2020-10-09 NOTE — Sedation Documentation (Signed)
Patient to CT. Procedure to be restarted

## 2020-10-09 NOTE — H&P (Signed)
Chief Complaint: Patient was seen in consultation today for left renal mass biopsy at the request of Gay,Matthew R  Referring Physician(s): Gay,Matthew R  Supervising Physician: Mir, Sharen Heck  Patient Status: [facility] - Out-pt  History of Present Illness: Daniel Herring is a 60 y.o. male   Hx Covid infection 06/2020 HTN; DM; HLD Pt with intermittent hematuria for several weeks Was seen in ED 09/14/20 Says hematuria resolves on own only to recur Denies pain or wt loss  CT revealing:   IMPRESSION: 1. 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 Tumor does not convincingly reach the inferior vena cava on noncontrast enhanced study. Further assessment of this mass will warrant either MR or CT pre and post-contrast. Note that there is soft tissue stranding in the left perinephric region with several lymph nodes medial to this left renal mass. These lymph nodes may well have neoplastic etiology although they are not overtly enlarged at this time.  2. Sigmoid diverticula without diverticulitis. No bowel wall thickening or bowel obstruction. No abscess in the abdomen or pelvis. Appendix appears normal.  4/6 MRI:  IMPRESSION: 1. Enhancing 13.2 cm left lower/interpolar renal mass, with extension into the LEFT renal vein without definite extension into the IVC. Findings are consistent with renal cell carcinoma. 2. Prominent perirenal lymph nodes measuring up to 6 mm, suspicious for disease involvement. 3. No other evidence of metastatic disease in the abdomen.  Was referred to Dr Abner Greenspan Urologist Recommended surgery-- pt wants biopsy to confirm a diagnosis before consenting to surgery Scheduled today for same '  Past Medical History:  Diagnosis Date  . Diabetes mellitus without complication ([place])   . Hyperlipidemia   . Hypertension     Past Surgical History:   Procedure Laterality Date  . VASECTOMY      Allergies: Farxiga [dapagliflozin]  Medications: Prior to Admission medications   Medication Sig Start Date End Date Taking? Authorizing Provider  aspirin 81 MG chewable tablet Chew 81 mg by mouth daily.   Yes [provider]  BYDUREON BCISE 2 MG/0.85ML AUIJ Inject 2 mg into the skin every Saturday. 05/08/20  Yes [provider]  glipiZIDE (GLUCOTROL XL) 10 MG 24 hr tablet Take 10 mg by mouth daily. 05/02/20  Yes [provider]  lisinopril (ZESTRIL) 40 MG tablet Take 40 mg by mouth daily. 05/26/20  Yes [provider]  metFORMIN (GLUCOPHAGE) 500 MG tablet Take 1,000 mg by mouth 2 (two) times daily.   Yes [provider]  Multiple Vitamin (MULTIVITAMIN WITH MINERALS) TABS tablet Take 1 tablet by mouth daily.   Yes [provider]  Multiple Vitamins-Minerals (EMERGEN-C IMMUNE) PACK Take 1 packet by mouth daily as needed (immune support).   Yes [provider]  Multiple Vitamins-Minerals (ZINC PO) Take 2 tablets by mouth daily as needed (immune support).   Yes [provider]  naproxen sodium (ALEVE) 220 MG tablet Take 220-440 mg by mouth 2 (two) times daily as needed (headaches/pain).   Yes [provider]  pioglitazone (ACTOS) 15 MG tablet Take 15 mg by mouth daily. 08/02/20  Yes [provider]  pravastatin (PRAVACHOL) 80 MG tablet Take 80 mg by mouth daily. 08/02/20  Yes [provider]  fluticasone (FLONASE) 50 MCG/ACT nasal spray Place 1 spray into both nostrils daily as needed for allergies or rhinitis.    [provider]  Family History  Problem Relation Age of Onset  . Heart disease Other   . Hyperlipidemia Other   . Hypertension Other   . Diabetes Other     Social History   Socioeconomic History  . Marital status: Married    Spouse name: Not on file  . Number of children: Not on file  . Years of education: Not on file   . Highest education level: Not on file  Occupational History  . Not on file  Tobacco Use  . Smoking status: Never Smoker  . Smokeless tobacco: Never Used  Vaping Use  . Vaping Use: Never used  Substance and Sexual Activity  . Alcohol use: Never  . Drug use: Never  . Sexual activity: Not on file  Other Topics Concern  . Not on file  Social History Narrative  . Not on file   Social Determinants of Health   Financial Resource Strain: Not on file  Food Insecurity: Not on file  Transportation Needs: Not on file  Physical Activity: Not on file  Stress: Not on file  Social Connections: Not on file    Review of Systems: A 12 point ROS discussed and pertinent positives are indicated in the HPI above.  All other systems are negative.  Review of Systems  Constitutional: Negative for activity change, fatigue, fever and unexpected weight change.  Respiratory: Negative for cough and shortness of breath.   Cardiovascular: Negative for chest pain.  Gastrointestinal: Negative for abdominal pain, nausea and vomiting.  Genitourinary: Positive for hematuria.  Musculoskeletal: Negative for back pain.  Neurological: Negative for weakness.  Psychiatric/Behavioral: Negative for behavioral problems and confusion.    Vital Signs: BP 139/80   Pulse 70   Temp 97.6 F (36.4 C) (Oral)   Resp 16   Ht 5\' 10"  (1.778 m)   Wt 275 lb (124.7 kg)   SpO2 94%   BMI 39.46 kg/m   Physical Exam Vitals reviewed.  HENT:     Mouth/Throat:     Mouth: Mucous membranes are moist.  Cardiovascular:     Rate and Rhythm: Normal rate and regular rhythm.     Heart sounds: Normal heart sounds.  Pulmonary:     Effort: Pulmonary effort is normal.     Breath sounds: Normal breath sounds.  Abdominal:     Palpations: Abdomen is soft.     Tenderness: There is no abdominal tenderness.  Musculoskeletal:        General: Normal range of motion.  Skin:    General: Skin is warm.  Neurological:     Mental  Status: He is alert and oriented to person, place, and time.  Psychiatric:        Behavior: Behavior normal.     Imaging: MR ABDOMEN WWO CONTRAST  Result Date: 09/22/2020 CLINICAL DATA:  Further characterization left upper pole renal lesion EXAM: MRI ABDOMEN WITHOUT AND WITH CONTRAST TECHNIQUE: Multiplanar multisequence MR imaging of the abdomen was performed both before and after the administration of intravenous contrast. CONTRAST:  73mL GADAVIST GADOBUTROL 1 MMOL/ML IV SOLN COMPARISON:  CT abdomen and pelvis September 14, 2020. FINDINGS: Lower chest: Lung bases are unremarkable. Hepatobiliary: No suspicious hepatic lesion. Gallbladder is unremarkable. No biliary ductal dilation. Pancreas:  Within normal limits. Spleen:  Within normal limits. Adrenals/Urinary Tract:  Bilateral adrenal glands are unremarkable. Enhancing heterogeneous left lower/interpolar renal mass which measures 13.2 by 9.0 x 9.0 cm on image 57/15 and 35/29, with adjacent perinephric stranding. There is extension of the mass  into the LEFT renal vein without definite extension into the IVC. The right kidney is unremarkable without hydronephrosis or mass like lesions. Stomach/Bowel: Visualized portions within the abdomen are unremarkable. Vascular/Lymphatic: No abdominal aortic aneurysm. Perirenal collateral vessels. Multiple subcentimeter lymph nodes immediately adjacent to the kidney measuring up to 5 mm on image 46/19. No pathologically enlarged lymph nodes in the abdomen by size criteria. Other:  No discrete peritoneal nodularity.  No ascites. Musculoskeletal: No aggressive lytic or blastic lesion of bone IMPRESSION: 1. Enhancing 13.2 cm left lower/interpolar renal mass, with extension into the LEFT renal vein without definite extension into the IVC. Findings are consistent with renal cell carcinoma. 2. Prominent perirenal lymph nodes measuring up to 6 mm, suspicious for disease involvement. 3. No other evidence of metastatic disease in the  abdomen. Electronically Signed   By: Dahlia Bailiff MD   On: 09/22/2020 07:18   CT Renal Stone Study  Addendum Date: 09/14/2020   ADDENDUM REPORT: 09/14/2020 15:05 ADDENDUM: Comment: Question debris versus localized urinary bladder wall thickening posteriorly. This finding may warrant direct visualization to further assess. Electronically Signed   By: Lowella Grip III M.D.   On: 09/14/2020 15:05   Result Date: 09/14/2020 CLINICAL DATA:  Flank pain and hematuria EXAM: CT ABDOMEN AND PELVIS WITHOUT CONTRAST TECHNIQUE: Multidetector CT imaging of the abdomen and pelvis was performed following the standard protocol without oral or IV contrast. COMPARISON:  None. FINDINGS: Lower chest: Lung bases are clear. Hepatobiliary: No focal liver lesions are appreciable on this noncontrast enhanced study. Gallbladder wall is not appreciably thickened. There is no biliary duct dilatation. Pancreas: There is no pancreatic mass or inflammatory focus. Spleen: No splenic lesions are evident. Adrenals/Urinary Tract: Adrenals bilaterally appear normal. There is a mass arising from the left kidney. This mass arises from the mid and inferior aspect of the kidney. This solid mass measures 12.7 cm from superior to inferior dimension. It measures 8.5 cm from right to left dimension and 8.9 cm from anterior to posterior dimension. There is apparent extension of mass into the left renal vein. It does not definitely extend to the inferior vena cava based on noncontrast enhanced study. There is soft tissue stranding in the left perinephric region. There is no appreciable renal mass on the right. On the right, there are scattered 1-2 mm calculi. There is a 2 mm calculus in the lateral mid left kidney. No ureteral calculi evident on either side. There is apparent debris along the posterior aspect of the urinary bladder with localized wall thickening in this area. Urinary bladder otherwise appears unremarkable. Stomach/Bowel: There are  sigmoid diverticula without diverticulitis. There is no appreciable bowel wall or mesenteric thickening. No evident bowel obstruction. Terminal ileum appears normal. Appendix appears normal. No evident free air or portal venous air. Vascular/Lymphatic: No abdominal aortic aneurysm. Calcification is noted in the aorta and common iliac arteries bilaterally. There are subcentimeter lymph nodes immediately adjacent to the left renal mass medially. No enlarged lymph nodes seen in the abdomen or pelvis by size criteria. Reproductive: Prostate and seminal vesicles normal in size and contour. No scrotal region mass appreciable on noncontrast enhanced CT. Other: There is fat in each inguinal ring. No abscess or ascites in the abdomen or pelvis. Musculoskeletal: No blastic or lytic bone lesions. Probable subcentimeter bone island in the left iliac crest slightly lateral to the sacroiliac joint. No intramuscular lesions. IMPRESSION: 1. Solid renal mass arising from the mid to lower left kidney measuring 12.7 x 8.5 x  8.9 cm with apparent invasion of tumor into the left renal vein by CT. Tumor does not convincingly reach the inferior vena cava on noncontrast enhanced study. Further assessment of this mass will warrant either MR or CT pre and post-contrast. Note that there is soft tissue stranding in the left perinephric region with several lymph nodes medial to this left renal mass. These lymph nodes may well have neoplastic etiology although they are not overtly enlarged at this time. 2. Sigmoid diverticula without diverticulitis. No bowel wall thickening or bowel obstruction. No abscess in the abdomen or pelvis. Appendix appears normal. 3.  Aortic Atherosclerosis (ICD10-I70.0). Electronically Signed: By: Lowella Grip III M.D. On: 09/14/2020 11:15    Labs:  CBC: Recent Labs    06/23/20 1113 06/24/20 0248 09/14/20 1041 10/09/20 0615  WBC 7.0 6.1 9.7 9.9  HGB 12.1* 11.8* 12.8* 12.3*  HCT 36.9* 36.6* 38.1* 37.7*   PLT 392 406* 339 371    COAGS: Recent Labs    10/09/20 0615  INR 1.0    BMP: Recent Labs    06/23/20 1113 06/24/20 0805 09/14/20 1041  NA 137 135 136  K 4.4 4.7 4.6  CL 102 102 104  CO2 23 23 22   GLUCOSE 267* 270* 142*  BUN 20 22 33*  CALCIUM 8.8* 8.9 9.4  CREATININE 1.04 0.88 1.35*  GFRNONAA >60 >60 60*    LIVER FUNCTION TESTS: Recent Labs    06/23/20 1113 06/24/20 0805 09/14/20 1041  BILITOT 0.6 0.6 0.4  AST 34 29 19  ALT 34 33 21  ALKPHOS 57 52 67  PROT 7.1 6.7 7.2  ALBUMIN 3.4* 3.1* 3.8    TUMOR MARKERS: No results for input(s): AFPTM, CEA, CA199, CHROMGRNA in the last 8760 hours.  Assessment and Plan:  Painless Hematuria off and on for weeks CT and MRI revealing left renal mass consistent with renal cell carcinoma Pt requesting biopsy of lesion before consenting to surgery Scheduled now for biopsy Risks and benefits of left renal mass biopsy was discussed with the patient and/or patient's family including, but not limited to bleeding, infection, damage to adjacent structures or low yield requiring additional tests.  All of the questions were answered and there is agreement to proceed. Consent signed and in chart.  Thank you for this interesting consult.  I greatly enjoyed meeting Daniel Herring and look forward to participating in their care.  A copy of this report was sent to the requesting provider on this date.  Electronically Signed: Lavonia Drafts, PA-C 10/09/2020, 7:25 AM   I spent a total of  30 Minutes   in face to face in clinical consultation, greater than 50% of which was counseling/coordinating care for left renal mass bx

## 2020-10-10 LAB — SURGICAL PATHOLOGY

## 2020-10-11 ENCOUNTER — Other Ambulatory Visit: Payer: Self-pay | Admitting: Urology

## 2020-10-25 ENCOUNTER — Other Ambulatory Visit (HOSPITAL_COMMUNITY): Payer: Self-pay

## 2020-10-27 NOTE — Progress Notes (Signed)
DUE TO COVID-19 ONLY ONE VISITOR IS ALLOWED TO COME WITH YOU AND STAY IN THE WAITING ROOM ONLY DURING PRE OP AND PROCEDURE DAY OF SURGERY. THE 1 VISITOR  MAY VISIT WITH YOU AFTER SURGERY IN YOUR PRIVATE ROOM DURING VISITING HOURS ONLY!  YOU NEED TO HAVE A COVID 19 TEST ON____5/23/22 ___ @_______ , THIS TEST MUST BE DONE BEFORE SURGERY,  COVID TESTING SITE [address] [place] [id number], IT IS ON THE RIGHT GOING OUT [address] APPROXIMATELY  2 MINUTES PAST [employer] ON THE RIGHT. ONCE YOUR COVID TEST IS COMPLETED,  PLEASE BEGIN THE QUARANTINE INSTRUCTIONS AS OUTLINED IN YOUR HANDOUT.                Daniel Herring  10/27/2020   Your procedure is scheduled on:  11/08/2020   Report to [facility] Main  Entrance   Report to admitting at    [facility]     Call this number if you have problems the morning of surgery [phone]    Remember: Do not eat food , candy gum or mints :After Midnight. You may have clear liquids from midnight until 0415am     CLEAR LIQUID DIET   Foods Allowed                                                                       Coffee and tea, regular and decaf                              Plain Jell-O any favor except red or purple                                            Fruit ices (not with fruit pulp)                                      Iced Popsicles                                     Carbonated beverages, regular and diet                                    Cranberry, grape and apple juices Sports drinks like Gatorade Lightly seasoned clear broth or consume(fat free) Sugar, honey syrup   _____________________________________________________________________    BRUSH YOUR TEETH MORNING OF SURGERY AND RINSE YOUR MOUTH OUT, NO CHEWING GUM CANDY OR MINTS.     Take these medicines the morning of surgery with A SIP OF WATER:       None  DO NOT TAKE ANY DIABETIC MEDICATIONS DAY OF YOUR SURGERY                                You may not have  any metal on your body including hair pins and              piercings  Do not wear jewelry, make-up, lotions, powders or perfumes, deodorant             Do not wear nail polish on your fingernails.  Do not shave  48 hours prior to surgery.              Men may shave face and neck.   Do not bring valuables to the hospital. [place].  Contacts, dentures or bridgework may not be worn into surgery.  Leave suitcase in the car. After surgery it may be brought to your room.     Patients discharged the day of surgery will not be allowed to drive home. IF YOU ARE HAVING SURGERY AND GOING HOME THE SAME DAY, YOU MUST HAVE AN ADULT TO DRIVE YOU HOME AND BE WITH YOU FOR 24 HOURS. YOU MAY GO HOME BY TAXI OR UBER OR ORTHERWISE, BUT AN ADULT MUST ACCOMPANY YOU HOME AND STAY WITH YOU FOR 24 HOURS.  Name and phone number of your driver:  Special Instructions: N/A              Please read over the following fact sheets you were given: _____________________________________________________________________  [facility] - Preparing for Surgery Before surgery, you can play an important role.  Because skin is not sterile, your skin needs to be as free of germs as possible.  You can reduce the number of germs on your skin by washing with CHG (chlorahexidine gluconate) soap before surgery.  CHG is an antiseptic cleaner which kills germs and bonds with the skin to continue killing germs even after washing. Please DO NOT use if you have an allergy to CHG or antibacterial soaps.  If your skin becomes reddened/irritated stop using the CHG and inform your nurse when you arrive at Short Stay. Do not shave (including legs and underarms) for at least 48 hours prior to the first CHG shower.  You may shave your face/neck. Please follow these instructions carefully:  1.  Shower with CHG Soap the night before surgery and the  morning of Surgery.  2.  If you  choose to wash your hair, wash your hair first as usual with your  normal  shampoo.  3.  After you shampoo, rinse your hair and body thoroughly to remove the  shampoo.                           4.  Use CHG as you would any other liquid soap.  You can apply chg directly  to the skin and wash                       Gently with a scrungie or clean washcloth.  5.  Apply the CHG Soap to your body ONLY FROM THE NECK DOWN.   Do not use on face/ open                           Wound or open sores. Avoid contact with eyes, ears mouth and genitals (private parts).                       Wash  face,  Genitals (private parts) with your normal soap.             6.  Wash thoroughly, paying special attention to the area where your surgery  will be performed.  7.  Thoroughly rinse your body with warm water from the neck down.  8.  DO NOT shower/wash with your normal soap after using and rinsing off  the CHG Soap.                9.  Pat yourself dry with a clean towel.            10.  Wear clean pajamas.            11.  Place clean sheets on your bed the night of your first shower and do not  sleep with pets. Day of Surgery : Do not apply any lotions/deodorants the morning of surgery.  Please wear clean clothes to the hospital/surgery center.  FAILURE TO FOLLOW THESE INSTRUCTIONS MAY RESULT IN THE CANCELLATION OF YOUR SURGERY PATIENT SIGNATURE_________________________________  NURSE SIGNATURE__________________________________  ________________________________________________________________________

## 2020-10-31 ENCOUNTER — Other Ambulatory Visit: Payer: Self-pay

## 2020-10-31 ENCOUNTER — Encounter (HOSPITAL_COMMUNITY): Payer: Self-pay

## 2020-10-31 ENCOUNTER — Encounter (HOSPITAL_COMMUNITY)
Admission: RE | Admit: 2020-10-31 | Discharge: 2020-10-31 | Disposition: A | Discharge status: Home / Self Care | Payer: BC Managed Care – PPO | Source: Ambulatory Visit | Attending: Urology | Admitting: Urology

## 2020-10-31 DIAGNOSIS — Z01812 Encounter for preprocedural laboratory examination: Secondary | ICD-10-CM | POA: Insufficient documentation

## 2020-10-31 HISTORY — DX: Chronic kidney disease, unspecified: N18.9

## 2020-10-31 HISTORY — DX: Malignant (primary) neoplasm, unspecified: C80.1

## 2020-10-31 HISTORY — DX: Personal history of urinary calculi: Z87.442

## 2020-10-31 LAB — CBC
HCT: 36.1 % — ABNORMAL LOW (ref 39.0–52.0)
Hemoglobin: 11.6 g/dL — ABNORMAL LOW (ref 13.0–17.0)
MCH: 27.6 pg (ref 26.0–34.0)
MCHC: 32.1 g/dL (ref 30.0–36.0)
MCV: 85.7 fL (ref 80.0–100.0)
Platelets: 405 10*3/uL — ABNORMAL HIGH (ref 150–400)
RBC: 4.21 MIL/uL — ABNORMAL LOW (ref 4.22–5.81)
RDW: 14.3 % (ref 11.5–15.5)
WBC: 9.8 10*3/uL (ref 4.0–10.5)
nRBC: 0 % (ref 0.0–0.2)

## 2020-10-31 LAB — BASIC METABOLIC PANEL
Anion gap: 9 (ref 5–15)
BUN: 25 mg/dL — ABNORMAL HIGH (ref 8–23)
CO2: 23 mmol/L (ref 22–32)
Calcium: 9.6 mg/dL (ref 8.9–10.3)
Chloride: 103 mmol/L (ref 98–111)
Creatinine, Ser: 1.42 mg/dL — ABNORMAL HIGH (ref 0.61–1.24)
GFR, Estimated: 56 mL/min — ABNORMAL LOW (ref >60)
Glucose, Bld: 192 mg/dL — ABNORMAL HIGH (ref 70–99)
Potassium: 4.6 mmol/L (ref 3.5–5.1)
Sodium: 135 mmol/L (ref 135–145)

## 2020-10-31 LAB — GLUCOSE, CAPILLARY: Glucose-Capillary: 195 mg/dL — ABNORMAL HIGH (ref 70–99)

## 2020-10-31 NOTE — Progress Notes (Addendum)
Anesthesia Review:  PCP: DR Berline Lopes  LOV 09/14/20 Care Everywhere  Cardiologist : none  Chest x-ray : 1 view 06/23/20  EKG : 06/23/20 Echo : Stress test: Cardiac Cath :  Activity level: can do a fight of stair siwthout difficulty  Sleep Study/ CPAP : none  Fasting Blood Sugar :      / Checks Blood Sugar -- times a day:   Blood Thinner/ Instructions /Last Dose: ASA / Instructions/ Last Dose :  DM- type 2 - has not checked glucose lately per pt does have machine at home  [id number] on 10/26/2020 - routed to Dr Lovena Neighbours on 10/31/20.  Positive covid - 06/23/20  Aspirin 81 mg- stop 5 days prior per pt  PT called and instructed to take usual dose of Bydureon on Saturday schedule.  PT voiced understanding.

## 2020-11-06 ENCOUNTER — Other Ambulatory Visit (HOSPITAL_COMMUNITY)
Admission: RE | Admit: 2020-11-06 | Discharge: 2020-11-06 | Disposition: A | Discharge status: Home / Self Care | Payer: BC Managed Care – PPO | Source: Ambulatory Visit | Attending: Urology | Admitting: Urology

## 2020-11-06 DIAGNOSIS — Z20822 Contact with and (suspected) exposure to covid-19: Secondary | ICD-10-CM | POA: Insufficient documentation

## 2020-11-06 DIAGNOSIS — Z01812 Encounter for preprocedural laboratory examination: Secondary | ICD-10-CM | POA: Insufficient documentation

## 2020-11-06 LAB — SARS CORONAVIRUS 2 (TAT 6-24 HRS): SARS Coronavirus 2: NEGATIVE

## 2020-11-07 NOTE — Anesthesia Preprocedure Evaluation (Addendum)
Anesthesia Evaluation  Patient identified by MRN, date of birth, ID band Patient awake    Reviewed: Allergy & Precautions, NPO status , Patient's Chart, lab work & pertinent test results  History of Anesthesia Complications Negative for: history of anesthetic complications  Airway Mallampati: II  TM Distance: >3 FB Neck ROM: Full    Dental no notable dental hx. (+) Dental Advisory Given   Pulmonary neg pulmonary ROS,    Pulmonary exam normal        Cardiovascular hypertension, Pt. on medications Normal cardiovascular exam     Neuro/Psych negative neurological ROS     GI/Hepatic negative GI ROS, Neg liver ROS,   Endo/Other  diabetesMorbid obesity  Renal/GU Renal InsufficiencyRenal disease     Musculoskeletal negative musculoskeletal ROS (+)   Abdominal   Peds  Hematology negative hematology ROS (+)   Anesthesia Other Findings   Reproductive/Obstetrics                            Anesthesia Physical Anesthesia Plan  ASA: III  Anesthesia Plan: General   Post-op Pain Management:    Induction: Intravenous  PONV Risk Score and Plan: 4 or greater and Ondansetron, Dexamethasone, Midazolam and Scopolamine patch - Pre-op  Airway Management Planned: Oral ETT  Additional Equipment:   Intra-op Plan:   Post-operative Plan: Extubation in OR  Informed Consent: I have reviewed the patients History and Physical, chart, labs and discussed the procedure including the risks, benefits and alternatives for the proposed anesthesia with the patient or authorized representative who has indicated his/her understanding and acceptance.     Dental advisory given  Plan Discussed with: Anesthesiologist and CRNA  Anesthesia Plan Comments:        Anesthesia Quick Evaluation

## 2020-11-08 ENCOUNTER — Encounter (HOSPITAL_COMMUNITY): Payer: Self-pay | Admitting: Urology

## 2020-11-08 ENCOUNTER — Inpatient Hospital Stay (HOSPITAL_COMMUNITY): Payer: BC Managed Care – PPO | Admitting: Anesthesiology

## 2020-11-08 ENCOUNTER — Other Ambulatory Visit: Payer: Self-pay

## 2020-11-08 ENCOUNTER — Inpatient Hospital Stay (HOSPITAL_COMMUNITY)
Admission: RE | Admit: 2020-11-08 | Discharge: 2020-11-13 | DRG: 657 | Disposition: A | Discharge status: Home / Self Care | Payer: BC Managed Care – PPO | Attending: Urology | Admitting: Urology

## 2020-11-08 ENCOUNTER — Encounter (HOSPITAL_COMMUNITY)
Admission: RE | Disposition: A | Discharge status: Home / Self Care | Payer: Self-pay | Source: Home / Self Care | Attending: Urology

## 2020-11-08 DIAGNOSIS — Z841 Family history of disorders of kidney and ureter: Secondary | ICD-10-CM

## 2020-11-08 DIAGNOSIS — R310 Gross hematuria: Secondary | ICD-10-CM | POA: Diagnosis not present

## 2020-11-08 DIAGNOSIS — Z20822 Contact with and (suspected) exposure to covid-19: Secondary | ICD-10-CM | POA: Diagnosis present

## 2020-11-08 DIAGNOSIS — E785 Hyperlipidemia, unspecified: Secondary | ICD-10-CM | POA: Diagnosis present

## 2020-11-08 DIAGNOSIS — Z79899 Other long term (current) drug therapy: Secondary | ICD-10-CM | POA: Diagnosis not present

## 2020-11-08 DIAGNOSIS — Z7984 Long term (current) use of oral hypoglycemic drugs: Secondary | ICD-10-CM | POA: Diagnosis not present

## 2020-11-08 DIAGNOSIS — R339 Retention of urine, unspecified: Secondary | ICD-10-CM | POA: Diagnosis not present

## 2020-11-08 DIAGNOSIS — D7812 Accidental puncture and laceration of the spleen during other procedure: Secondary | ICD-10-CM | POA: Diagnosis not present

## 2020-11-08 DIAGNOSIS — I10 Essential (primary) hypertension: Secondary | ICD-10-CM | POA: Diagnosis present

## 2020-11-08 DIAGNOSIS — E119 Type 2 diabetes mellitus without complications: Secondary | ICD-10-CM | POA: Diagnosis present

## 2020-11-08 DIAGNOSIS — Z6841 Body Mass Index (BMI) 40.0 and over, adult: Secondary | ICD-10-CM

## 2020-11-08 DIAGNOSIS — D62 Acute posthemorrhagic anemia: Secondary | ICD-10-CM | POA: Diagnosis not present

## 2020-11-08 DIAGNOSIS — Z7982 Long term (current) use of aspirin: Secondary | ICD-10-CM | POA: Diagnosis not present

## 2020-11-08 DIAGNOSIS — N2889 Other specified disorders of kidney and ureter: Secondary | ICD-10-CM | POA: Diagnosis present

## 2020-11-08 DIAGNOSIS — C642 Malignant neoplasm of left kidney, except renal pelvis: Principal | ICD-10-CM | POA: Diagnosis present

## 2020-11-08 DIAGNOSIS — Y836 Removal of other organ (partial) (total) as the cause of abnormal reaction of the patient, or of later complication, without mention of misadventure at the time of the procedure: Secondary | ICD-10-CM | POA: Diagnosis not present

## 2020-11-08 HISTORY — PX: ROBOT ASSISTED LAPAROSCOPIC NEPHRECTOMY: SHX5140

## 2020-11-08 LAB — GLUCOSE, CAPILLARY
Glucose-Capillary: 179 mg/dL — ABNORMAL HIGH (ref 70–99)
Glucose-Capillary: 216 mg/dL — ABNORMAL HIGH (ref 70–99)
Glucose-Capillary: 259 mg/dL — ABNORMAL HIGH (ref 70–99)
Glucose-Capillary: 239 mg/dL — ABNORMAL HIGH (ref 70–99)
Glucose-Capillary: 226 mg/dL — ABNORMAL HIGH (ref 70–99)
Glucose-Capillary: 156 mg/dL — ABNORMAL HIGH (ref 70–99)
Glucose-Capillary: 180 mg/dL — ABNORMAL HIGH (ref 70–99)
Glucose-Capillary: 203 mg/dL — ABNORMAL HIGH (ref 70–99)
Glucose-Capillary: 181 mg/dL — ABNORMAL HIGH (ref 70–99)

## 2020-11-08 LAB — HEMOGLOBIN AND HEMATOCRIT, BLOOD
HCT: 29.9 % — ABNORMAL LOW (ref 39.0–52.0)
HCT: 29.1 % — ABNORMAL LOW (ref 39.0–52.0)
Hemoglobin: 9.5 g/dL — ABNORMAL LOW (ref 13.0–17.0)
Hemoglobin: 9.4 g/dL — ABNORMAL LOW (ref 13.0–17.0)

## 2020-11-08 LAB — ABO/RH: ABO/RH(D): A POS

## 2020-11-08 SURGERY — NEPHRECTOMY, RADICAL, ROBOT-ASSISTED, LAPAROSCOPIC, ADULT
Anesthesia: General

## 2020-11-08 MED ORDER — SUGAMMADEX SODIUM 500 MG/5ML IV SOLN
INTRAVENOUS | Status: AC
  Filled 2020-11-08: qty 5

## 2020-11-08 MED ORDER — INSULIN ASPART 100 UNIT/ML IJ SOLN
0.0000 [IU] | Freq: Three times a day (TID) | INTRAMUSCULAR | Status: DC
  Administered 2020-11-08 – 2020-11-09 (×3): 7 [IU] via SUBCUTANEOUS
  Administered 2020-11-10: 4 [IU] via SUBCUTANEOUS
  Administered 2020-11-10: 15 [IU] via SUBCUTANEOUS
  Administered 2020-11-10 – 2020-11-11 (×3): 7 [IU] via SUBCUTANEOUS
  Administered 2020-11-11: 4 [IU] via SUBCUTANEOUS
  Administered 2020-11-12: 11 [IU] via SUBCUTANEOUS
  Administered 2020-11-12: 4 [IU] via SUBCUTANEOUS
  Administered 2020-11-12 – 2020-11-13 (×2): 7 [IU] via SUBCUTANEOUS

## 2020-11-08 MED ORDER — EPHEDRINE 5 MG/ML INJ
INTRAVENOUS | Status: AC
  Filled 2020-11-08: qty 10

## 2020-11-08 MED ORDER — SENNA 8.6 MG PO TABS
1.0000 | ORAL_TABLET | Freq: Two times a day (BID) | ORAL | Status: DC
  Administered 2020-11-08 – 2020-11-13 (×10): 8.6 mg via ORAL
  Filled 2020-11-08 (×10): qty 1

## 2020-11-08 MED ORDER — ONDANSETRON HCL 4 MG/2ML IJ SOLN: 4.0000 mg | Freq: Four times a day (QID) | INTRAMUSCULAR | Status: DC | PRN

## 2020-11-08 MED ORDER — MIDAZOLAM HCL 2 MG/2ML IJ SOLN
INTRAMUSCULAR | Status: AC
  Filled 2020-11-08: qty 2

## 2020-11-08 MED ORDER — DEXAMETHASONE SODIUM PHOSPHATE 10 MG/ML IJ SOLN
INTRAMUSCULAR | Status: DC | PRN
  Administered 2020-11-08: 4 mg via INTRAVENOUS

## 2020-11-08 MED ORDER — INSULIN ASPART 100 UNIT/ML IJ SOLN
0.0000 [IU] | Freq: Every day | INTRAMUSCULAR | Status: DC
  Administered 2020-11-09: 3 [IU] via SUBCUTANEOUS
  Administered 2020-11-11: 4 [IU] via SUBCUTANEOUS

## 2020-11-08 MED ORDER — ALBUMIN HUMAN 5 % IV SOLN: INTRAVENOUS | Status: DC | PRN

## 2020-11-08 MED ORDER — CEFAZOLIN IN SODIUM CHLORIDE 3-0.9 GM/100ML-% IV SOLN
3.0000 g | Freq: Once | INTRAVENOUS | Status: AC
  Administered 2020-11-08 (×2): 3 g via INTRAVENOUS
  Filled 2020-11-08: qty 100

## 2020-11-08 MED ORDER — BUPIVACAINE LIPOSOME 1.3 % IJ SUSP
20.0000 mL | Freq: Once | INTRAMUSCULAR | Status: AC
  Administered 2020-11-08: 20 mL
  Filled 2020-11-08: qty 20

## 2020-11-08 MED ORDER — LACTATED RINGERS IV SOLN: INTRAVENOUS | Status: DC

## 2020-11-08 MED ORDER — OXYCODONE HCL 5 MG PO TABS
5.0000 mg | ORAL_TABLET | ORAL | Status: DC | PRN
  Administered 2020-11-08: 5 mg via ORAL
  Administered 2020-11-08 – 2020-11-11 (×7): 10 mg via ORAL
  Filled 2020-11-08 (×6): qty 2
  Filled 2020-11-08: qty 1
  Filled 2020-11-08: qty 2

## 2020-11-08 MED ORDER — FENTANYL CITRATE (PF) 100 MCG/2ML IJ SOLN: 25.0000 ug | INTRAMUSCULAR | Status: DC | PRN

## 2020-11-08 MED ORDER — SODIUM CHLORIDE 0.9 % IV SOLN: INTRAVENOUS | Status: DC | PRN

## 2020-11-08 MED ORDER — ACETAMINOPHEN 500 MG PO TABS
1000.0000 mg | ORAL_TABLET | Freq: Four times a day (QID) | ORAL | Status: DC
  Administered 2020-11-08 – 2020-11-13 (×18): 1000 mg via ORAL
  Filled 2020-11-08 (×18): qty 2

## 2020-11-08 MED ORDER — SUCCINYLCHOLINE CHLORIDE 200 MG/10ML IV SOSY
PREFILLED_SYRINGE | INTRAVENOUS | Status: AC
  Filled 2020-11-08: qty 10

## 2020-11-08 MED ORDER — PROPOFOL 10 MG/ML IV BOLUS
INTRAVENOUS | Status: DC | PRN
  Administered 2020-11-08: 180 mg via INTRAVENOUS

## 2020-11-08 MED ORDER — PHENYLEPHRINE 40 MCG/ML (10ML) SYRINGE FOR IV PUSH (FOR BLOOD PRESSURE SUPPORT)
PREFILLED_SYRINGE | INTRAVENOUS | Status: DC | PRN
  Administered 2020-11-08 (×3): 120 ug via INTRAVENOUS

## 2020-11-08 MED ORDER — SODIUM CHLORIDE (PF) 0.9 % IJ SOLN
INTRAMUSCULAR | Status: DC | PRN
  Administered 2020-11-08: 10 mL

## 2020-11-08 MED ORDER — ALBUMIN HUMAN 5 % IV SOLN
INTRAVENOUS | Status: AC
  Filled 2020-11-08: qty 250

## 2020-11-08 MED ORDER — CHLORHEXIDINE GLUCONATE CLOTH 2 % EX PADS
6.0000 | MEDICATED_PAD | Freq: Every day | CUTANEOUS | Status: DC
  Administered 2020-11-09 (×2): 6 via TOPICAL

## 2020-11-08 MED ORDER — SODIUM CHLORIDE (PF) 0.9 % IJ SOLN
INTRAMUSCULAR | Status: AC
  Filled 2020-11-08: qty 20

## 2020-11-08 MED ORDER — FENTANYL CITRATE (PF) 250 MCG/5ML IJ SOLN
INTRAMUSCULAR | Status: AC
  Filled 2020-11-08: qty 5

## 2020-11-08 MED ORDER — ROCURONIUM BROMIDE 10 MG/ML (PF) SYRINGE
PREFILLED_SYRINGE | INTRAVENOUS | Status: AC
  Filled 2020-11-08: qty 10

## 2020-11-08 MED ORDER — SCOPOLAMINE 1 MG/3DAYS TD PT72
1.0000 | MEDICATED_PATCH | TRANSDERMAL | Status: DC
  Administered 2020-11-08: 1.5 mg via TRANSDERMAL
  Filled 2020-11-08: qty 1

## 2020-11-08 MED ORDER — MIDAZOLAM HCL 2 MG/2ML IJ SOLN
INTRAMUSCULAR | Status: DC | PRN
  Administered 2020-11-08: 2 mg via INTRAVENOUS

## 2020-11-08 MED ORDER — INSULIN REGULAR(HUMAN) IN NACL 100-0.9 UT/100ML-% IV SOLN
INTRAVENOUS | Status: DC
  Administered 2020-11-08: 14 [IU]/h via INTRAVENOUS
  Filled 2020-11-08 (×2): qty 100

## 2020-11-08 MED ORDER — EPHEDRINE SULFATE-NACL 50-0.9 MG/10ML-% IV SOSY
PREFILLED_SYRINGE | INTRAVENOUS | Status: DC | PRN
  Administered 2020-11-08 (×2): 10 mg via INTRAVENOUS

## 2020-11-08 MED ORDER — PHENYLEPHRINE 40 MCG/ML (10ML) SYRINGE FOR IV PUSH (FOR BLOOD PRESSURE SUPPORT)
PREFILLED_SYRINGE | INTRAVENOUS | Status: AC
  Filled 2020-11-08: qty 10

## 2020-11-08 MED ORDER — CHLORHEXIDINE GLUCONATE 0.12 % MT SOLN
15.0000 mL | Freq: Once | OROMUCOSAL | Status: AC
  Administered 2020-11-08: 15 mL via OROMUCOSAL

## 2020-11-08 MED ORDER — CELECOXIB 200 MG PO CAPS
200.0000 mg | ORAL_CAPSULE | Freq: Once | ORAL | Status: AC
  Administered 2020-11-08: 200 mg via ORAL
  Filled 2020-11-08: qty 1

## 2020-11-08 MED ORDER — INSULIN ASPART 100 UNIT/ML IJ SOLN
INTRAMUSCULAR | Status: AC
  Administered 2020-11-09: 4 [IU] via SUBCUTANEOUS
  Filled 2020-11-08: qty 1

## 2020-11-08 MED ORDER — ACETAMINOPHEN 500 MG PO TABS
1000.0000 mg | ORAL_TABLET | Freq: Once | ORAL | Status: AC
  Administered 2020-11-08: 1000 mg via ORAL
  Filled 2020-11-08: qty 2

## 2020-11-08 MED ORDER — LACTATED RINGERS IV SOLN: INTRAVENOUS | Status: DC | PRN

## 2020-11-08 MED ORDER — ONDANSETRON 4 MG PO TBDP
4.0000 mg | ORAL_TABLET | Freq: Four times a day (QID) | ORAL | Status: DC | PRN
  Administered 2020-11-11: 4 mg via ORAL
  Filled 2020-11-08: qty 1

## 2020-11-08 MED ORDER — SUGAMMADEX SODIUM 500 MG/5ML IV SOLN
INTRAVENOUS | Status: DC | PRN
  Administered 2020-11-08: 500 mg via INTRAVENOUS

## 2020-11-08 MED ORDER — PROPOFOL 10 MG/ML IV BOLUS
INTRAVENOUS | Status: AC
  Filled 2020-11-08: qty 20

## 2020-11-08 MED ORDER — DOCUSATE SODIUM 100 MG PO CAPS
100.0000 mg | ORAL_CAPSULE | Freq: Two times a day (BID) | ORAL | Status: DC
  Administered 2020-11-08 – 2020-11-13 (×10): 100 mg via ORAL
  Filled 2020-11-08 (×10): qty 1

## 2020-11-08 MED ORDER — ROCURONIUM BROMIDE 10 MG/ML (PF) SYRINGE
PREFILLED_SYRINGE | INTRAVENOUS | Status: DC | PRN
  Administered 2020-11-08: 30 mg via INTRAVENOUS
  Administered 2020-11-08: 20 mg via INTRAVENOUS
  Administered 2020-11-08: 30 mg via INTRAVENOUS
  Administered 2020-11-08: 70 mg via INTRAVENOUS

## 2020-11-08 MED ORDER — ORAL CARE MOUTH RINSE: 15.0000 mL | Freq: Once | OROMUCOSAL | Status: AC

## 2020-11-08 MED ORDER — LIDOCAINE 2% (20 MG/ML) 5 ML SYRINGE
INTRAMUSCULAR | Status: AC
  Filled 2020-11-08: qty 5

## 2020-11-08 MED ORDER — CEFAZOLIN IN SODIUM CHLORIDE 3-0.9 GM/100ML-% IV SOLN
INTRAVENOUS | Status: AC
  Filled 2020-11-08: qty 100

## 2020-11-08 MED ORDER — SUCCINYLCHOLINE CHLORIDE 200 MG/10ML IV SOSY
PREFILLED_SYRINGE | INTRAVENOUS | Status: DC | PRN
  Administered 2020-11-08: 140 mg via INTRAVENOUS

## 2020-11-08 MED ORDER — MORPHINE SULFATE (PF) 2 MG/ML IV SOLN
2.0000 mg | INTRAVENOUS | Status: DC | PRN
Start: 2020-11-08 — End: 2020-11-11
  Administered 2020-11-09: 2 mg via INTRAVENOUS
  Filled 2020-11-08 (×2): qty 1

## 2020-11-08 MED ORDER — 0.9 % SODIUM CHLORIDE (POUR BTL) OPTIME
TOPICAL | Status: DC | PRN
  Administered 2020-11-08: 1000 mL

## 2020-11-08 MED ORDER — SIMETHICONE 80 MG PO CHEW
40.0000 mg | CHEWABLE_TABLET | Freq: Four times a day (QID) | ORAL | Status: DC | PRN
  Administered 2020-11-09: 40 mg via ORAL
  Filled 2020-11-08: qty 1

## 2020-11-08 MED ORDER — LACTATED RINGERS IR SOLN
Status: DC | PRN
  Administered 2020-11-08: 1000 mL

## 2020-11-08 MED ORDER — ONDANSETRON HCL 4 MG/2ML IJ SOLN
INTRAMUSCULAR | Status: DC | PRN
  Administered 2020-11-08: 4 mg via INTRAVENOUS

## 2020-11-08 MED ORDER — DEXTROSE 50 % IV SOLN: 0.0000 mL | INTRAVENOUS | Status: DC | PRN

## 2020-11-08 MED ORDER — LIDOCAINE 2% (20 MG/ML) 5 ML SYRINGE
INTRAMUSCULAR | Status: DC | PRN
  Administered 2020-11-08: 80 mg via INTRAVENOUS

## 2020-11-08 MED ORDER — FENTANYL CITRATE (PF) 250 MCG/5ML IJ SOLN
INTRAMUSCULAR | Status: DC | PRN
  Administered 2020-11-08 (×4): 50 ug via INTRAVENOUS
  Administered 2020-11-08: 100 ug via INTRAVENOUS
  Administered 2020-11-08: 50 ug via INTRAVENOUS

## 2020-11-08 MED ORDER — INSULIN ASPART 100 UNIT/ML IJ SOLN
0.0000 [IU] | Freq: Three times a day (TID) | INTRAMUSCULAR | Status: DC
Start: 2020-11-08 — End: 2020-11-08

## 2020-11-08 MED ORDER — INSULIN ASPART 100 UNIT/ML IJ SOLN
3.0000 [IU] | Freq: Once | INTRAMUSCULAR | Status: AC
  Administered 2020-11-08: 3 [IU] via SUBCUTANEOUS

## 2020-11-08 SURGICAL SUPPLY — 64 items
ADH SKN CLS APL DERMABOND .7 (GAUZE/BANDAGES/DRESSINGS) ×1
ADH SKNCLS APL OCTYL .7 VIOL (GAUZE/BANDAGES/DRESSINGS) ×1
APL PRP STRL LF DISP 70% ISPRP (MISCELLANEOUS) ×1
BAG LAPAROSCOPIC 12 15 PORT 16 (BASKET) ×1 IMPLANT
BAG RETRIEVAL 12/15 (BASKET) ×2
CHLORAPREP W/TINT 26 (MISCELLANEOUS) ×2 IMPLANT
CLIP VESOLOCK LG 6/CT PURPLE (CLIP) ×3 IMPLANT
CLIP VESOLOCK MED LG 6/CT (CLIP) ×2 IMPLANT
CLIP VESOLOCK XL 6/CT (CLIP) ×2 IMPLANT
COVER SURGICAL LIGHT HANDLE (MISCELLANEOUS) ×2 IMPLANT
COVER TIP SHEARS 8 DVNC (MISCELLANEOUS) ×1 IMPLANT
COVER TIP SHEARS 8MM DA VINCI (MISCELLANEOUS) ×2
COVER WAND RF STERILE (DRAPES) IMPLANT
CUTTER ECHEON FLEX ENDO 45 340 (ENDOMECHANICALS) ×1 IMPLANT
DECANTER SPIKE VIAL GLASS SM (MISCELLANEOUS) ×2 IMPLANT
DERMABOND ADVANCED (GAUZE/BANDAGES/DRESSINGS) ×1
DERMABOND ADVANCED .7 DNX12 (GAUZE/BANDAGES/DRESSINGS) ×1 IMPLANT
DRAPE ARM DVNC X/XI (DISPOSABLE) ×4 IMPLANT
DRAPE COLUMN DVNC XI (DISPOSABLE) ×1 IMPLANT
DRAPE DA VINCI XI ARM (DISPOSABLE) ×8
DRAPE DA VINCI XI COLUMN (DISPOSABLE) ×2
DRAPE INCISE IOBAN 66X45 STRL (DRAPES) ×2 IMPLANT
DRAPE SHEET LG 3/4 BI-LAMINATE (DRAPES) ×2 IMPLANT
ELECT PENCIL ROCKER SW 15FT (MISCELLANEOUS) ×2 IMPLANT
ELECT REM PT RETURN 15FT ADLT (MISCELLANEOUS) ×2 IMPLANT
GLOVE SURG ENC MOIS LTX SZ6.5 (GLOVE) ×2 IMPLANT
GLOVE SURG ENC TEXT LTX SZ7.5 (GLOVE) ×4 IMPLANT
GOWN STRL REUS W/TWL LRG LVL3 (GOWN DISPOSABLE) ×5 IMPLANT
GOWN STRL REUS W/TWL XL LVL3 (GOWN DISPOSABLE) ×4 IMPLANT
HEMOSTAT SURGICEL 4X8 (HEMOSTASIS) ×1 IMPLANT
HOLDER FOLEY CATH W/STRAP (MISCELLANEOUS) ×2 IMPLANT
IRRIG SUCT STRYKERFLOW 2 WTIP (MISCELLANEOUS) ×2
IRRIGATION SUCT STRKRFLW 2 WTP (MISCELLANEOUS) ×1 IMPLANT
KIT BASIN OR (CUSTOM PROCEDURE TRAY) ×2 IMPLANT
KIT TURNOVER KIT A (KITS) ×2 IMPLANT
LOOP VESSEL MAXI BLUE (MISCELLANEOUS) ×1 IMPLANT
MARKER SKIN DUAL TIP RULER LAB (MISCELLANEOUS) ×2 IMPLANT
NDL INSUFFLATION 14GA 120MM (NEEDLE) ×1 IMPLANT
NEEDLE INSUFFLATION 14GA 120MM (NEEDLE) ×2 IMPLANT
PROTECTOR NERVE ULNAR (MISCELLANEOUS) ×4 IMPLANT
RELOAD STAPLE 45 2.6 WHT THIN (STAPLE) IMPLANT
SEAL CANN UNIV 5-8 DVNC XI (MISCELLANEOUS) ×3 IMPLANT
SEAL XI 5MM-8MM UNIVERSAL (MISCELLANEOUS) ×8
SEALER VESSEL DA VINCI XI (MISCELLANEOUS) ×2
SEALER VESSEL EXT DVNC XI (MISCELLANEOUS) IMPLANT
SET TUBE SMOKE EVAC HIGH FLOW (TUBING) ×2 IMPLANT
SOLUTION ELECTROLUBE (MISCELLANEOUS) ×2 IMPLANT
STAPLE RELOAD 45 WHT (STAPLE) ×3 IMPLANT
STAPLE RELOAD 45MM WHITE (STAPLE) ×6
SUT MNCRL AB 4-0 PS2 18 (SUTURE) ×4 IMPLANT
SUT PDS AB 0 CT1 36 (SUTURE) ×4 IMPLANT
SUT VIC AB 3-0 SH 27 (SUTURE) ×2
SUT VIC AB 3-0 SH 27XBRD (SUTURE) IMPLANT
SUT VICRYL 0 UR6 27IN ABS (SUTURE) ×2 IMPLANT
SYR TOOMEY IRRIG 70ML (MISCELLANEOUS) ×2
SYRINGE TOOMEY IRRIG 70ML (MISCELLANEOUS) IMPLANT
TOWEL OR 17X26 10 PK STRL BLUE (TOWEL DISPOSABLE) ×2 IMPLANT
TOWEL OR NON WOVEN STRL DISP B (DISPOSABLE) ×2 IMPLANT
TRAY FOLEY MTR SLVR 16FR STAT (SET/KITS/TRAYS/PACK) ×1 IMPLANT
TRAY LAPAROSCOPIC (CUSTOM PROCEDURE TRAY) ×2 IMPLANT
TROCAR BLADELESS OPT 5 100 (ENDOMECHANICALS) ×1 IMPLANT
TROCAR XCEL 12X100 BLDLESS (ENDOMECHANICALS) ×2 IMPLANT
URINEMETER 200ML W/220 (MISCELLANEOUS) ×1 IMPLANT
WATER STERILE IRR 1000ML POUR (IV SOLUTION) ×2 IMPLANT

## 2020-11-08 NOTE — Op Note (Signed)
Operative Note  Preoperative diagnosis:  1.  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  Robot-assisted laparoscopic left radical nephrectomy 2.  Intraoperative ultrasound of single retroperitoneal organ  Surgeon: Ellison Hughs, MD  Assistants: 1.  Rexene Alberts, MD An assistant was required for this surgical procedure.  The duties of the assistant included but were not limited to suctioning, passing suture, camera manipulation, retraction.  This procedure would not be able to be performed without an Environmental consultant.  2.  Kennyth Arnold, MD  PGY4  Anesthesia:  General  Complications: Grade 2 splenic laceration  EBL: 1100 mL  Specimens: 1.  Left kidney and adrenal gland  Drains/Catheters: 1.  24 French Foley catheter  Intraoperative findings:   1. Intraoperative ultrasound of the left renal vein revealed tumor thrombus that stopped approximately 2 cm from the os of the vena cava.  The tumor thrombus appeared to be well above the endovascular staple line following left renal vein ligation  Indication:  Daniel Herring is a 60 y.o. male with a solid and enhancing 13 cm left renal mass.  The patient underwent a renal mass biopsy on 10/09/2020, which confirmed the presence of renal cell carcinoma.  He has been consented for the above procedures, voices understanding and wishes to proceed.  Description of procedure:  After informed consent was obtained, the patient was brought to the operating room and general endotracheal anesthesia was administered.  The patient was then placed in the right lateral decubitus position and prepped and draped in usual sterile fashion.  A timeout was performed.  An 8 mm incision was then made lateral to the left rectus muscle at the level of the left 12th rib.  Abdominal access was obtained via a Veress needle.  The  abdominal cavity was then insufflated up to 15 mmHg.  An 8 mm port was then introduced into the abdominal cavity.  Inspection of the port entry site by the robotic camera revealed no adjacent organ injury.  We then placed 3 additional 8 mm robotic ports to triangulate the left renal hilum.  A 12 mm assistant port was then placed between the carmera port and 3rd robotic arm.  An additional 5 mm port was then placed between the first robotic arm and the camera port.  The white line of [place]t along the descending colon was incised sharply and the colon, along with its mesocolonic fat, was reflected medially until the aorta was identified.  We then made a small window adjacent to the lower pole of the left kidney, identifying the left psoas muscle, left ureter and left gonadal vein.  The left ureter and gonadal vein were then reflected anteriorly allowing [place] to then incised the perihilar attachments using electrocautery.  We encountered a small lumbar vein adjacent to the insertion of the left gonadal vein into the left renal vein.  This lumbar vein was ligated vessel sealer.  This provided exposure to the left renal hilum.    A small rent was made in the posterior aspects of the renal vein during anterior retraction of the kidney. A 45 mm endovascular stapler was then used to ligate the 2 left renal arteries achieving hemostasis from the left renal vein bleed.  Intraoperative ultrasound was then used to visualize the tumor thrombus, which was approximately 2 cm away from the os of the vena cava and branching  of the left renal vein.  A vessel loop was then used to milk the tumor thrombus back into the kidney, which provided extra length on the left renal vein for staple ligation, which was achieved with a 45 mm endovascular stapler.  The remaining peri-renal attachments were then excised using a combination of blunt dissection and electrocautery.  The left adrenal gland was taken with the left kidney.  While  dissecting the remaining attachments along the upper pole, small grade 2 laceration was made in the spleen.  Electrocautery and Surgicel were used to achieve hemostasis.  The endovascular stapler was then used to ligate the left gonadal vein and left ureter.    The robot was then de-docked.  A left lower quadrant Gibson incision was then made and the mass was removed.  The fascia within the midline assistant port was then closed using an interrupted 0 Vicryl suture.  The fascia of the internal and external oblique was then closed using a 0 PDS suture in a running fashion.  The subcutaneous tissue within the [facility] incision was then closed using a running 0 Vicryl suture.  All skin incisions were then closed using 4-0 Monocryl and then dressed with Dermabond.  The patient tolerated the procedure well and was transferred to the postanesthesia in stable condition.     Plan:  Monitor on the floor

## 2020-11-08 NOTE — H&P (Signed)
PRE-OP H&P  Office Visit Report     11/07/2020   --------------------------------------------------------------------------------   [name] A. Antenucci  MRN: [id number]  DOB: 1961-02-11, 60 year old Male  SSN:    PRIMARY CARE:    REFERRING:  Janith Lima, MD  PROVIDER:  Rexene Alberts, M.D.  TREATING:  Ellison Hughs, M.D.  LOCATION:  [facility] [phone]     --------------------------------------------------------------------------------   CC/HPI: Daniel Herring is a 60 year old male seen in consultation today for a left renal mass.   He presented to [facility] ED on 09/14/2020 with painless hematuria for approximately 1 week. CT stone protocol 09/14/2020 incidentally revealed a solid renal mass arising from the mid to lower left kidney measuring 12.7 x 8.5 x 8.9 cm with apparent invasion of tumor into the left renal vein however not convincingly reaching the inferior vena cava. There is also seen to be subcentimeter lymph nodes adjacent to the left renal mass medially with no enlarged lymph nodes in the abdomen or pelvis by size criteria.   He states that his gross hematuria has resolved. He denies abdominal pain or flank pain. He denies a personal history of malignancy. He denies a family history of urologic malignancy.   He does have a history of urolithiasis and states that he has passed stones in the past.   He denies voiding complaints. He has no significant lower urinary tract symptoms.   His creatinine on 09/14/2020 was 1.3.   Does have a history of hypertension, diabetes, hyperlipidemia and is obese with a BMI of 40.   He takes aspirin however no other anticoagulation. He denies prior abdominal surgeries.   Of note, he is in some suspicion and questionable denial of his diagnosis. He is not convinced that this is a true malignancy and he states that he previously had a lipoma excised from his arm and thinks that this left renal mass could be a benign  process. I did show him the images and discussed that this is almost certainly a renal cell carcinoma with classic extension of the tumor into the left renal vein.   10/19/2020: The patient is here to discuss his upcoming surgery in more detail. Recent renal mass biopsy confirmed the presence of renal cell carcinoma. He has done well following his biopsy and denies residual flank pain or hematuria.   11/07/20: The patient presents today with a 24 hour history of gross hematuria with clots and worsening LUTS. He denies any recent trauma or UTI-like symptoms. He states that until this morning, he was only able to void small amounts despite and that he has to crede in order to initiate his stream. He denies flank pain, nausea/vomiting or fever/chills. 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     ALLERGIES: Farxiga    MEDICATIONS: Aspirin 81 mg tablet,chewable 1 tablet PO Daily  Lisinopril 40 mg tablet  Atorvastatin Calcium 40 mg tablet  Bydureon Bcise 2 mg/0.85 ml auto-injector  Emergen-C Immune Plus  Glipizide Er 10 mg tablet, extended release 24 hr  Metformin Hcl Er 500 mg tablet, extended release 24 hr  Pioglitazone Hcl 15 mg tablet  Pravastatin Sodium 80 mg tablet  Zinc     GU PSH: Cystoscopy - 09/21/2020 Locm 300-399Mg /Ml Iodine,1Ml - 11/02/2020, 10/12/2020       PSH Notes: Remove Fatty Tumor - 1992   NON-GU PSH: No Non-GU PSH    GU PMH: Left renal neoplasm -  11/02/2020, - 10/12/2020, - 09/21/2020 Renal cell carcinoma, left - 10/18/2020 Gross hematuria - 09/21/2020 Varicocele - 09/21/2020    NON-GU PMH: Depression Diabetes Type 2 Gout Hypercholesterolemia Hypertension    FAMILY HISTORY: Diabetes - Father High Blood Pressure - Father High Cholesterol - Father Kidney Failure - Father Kidney Stones - Father Prostate Cancer - Uncle   SOCIAL HISTORY: Marital Status: Married Ethnicity: Not Hispanic Or Latino;  Race: White Current Smoking Status: Patient has never smoked.   Tobacco Use Assessment Completed: Used Tobacco in last 30 days? Has never drank.  Drinks 3 caffeinated drinks per day.    REVIEW OF SYSTEMS:    GU Review Male:   Patient denies frequent urination, hard to postpone urination, burning/ pain with urination, get up at night to urinate, leakage of urine, stream starts and stops, trouble starting your stream, have to strain to urinate , erection problems, and penile pain.  Gastrointestinal (Upper):   Patient denies indigestion/ heartburn, nausea, and vomiting.  Gastrointestinal (Lower):   Patient denies diarrhea and constipation.  Constitutional:   Patient denies fever, night sweats, weight loss, and fatigue.  Skin:   Patient denies skin rash/ lesion and itching.  Eyes:   Patient denies blurred vision and double vision.  Ears/ Nose/ Throat:   Patient denies sore throat and sinus problems.  Hematologic/Lymphatic:   Patient denies swollen glands and easy bruising.  Cardiovascular:   Patient denies leg swelling and chest pains.  Respiratory:   Patient denies cough and shortness of breath.  Endocrine:   Patient denies excessive thirst.  Musculoskeletal:   Patient denies back pain and joint pain.  Neurological:   Patient denies headaches and dizziness.  Psychologic:   Patient denies depression and anxiety.   VITAL SIGNS:      11/07/2020 08:15 AM  Weight 275 lb / 124.74 kg  Height 70 in / 177.8 cm  BP 110/69 mmHg  Pulse 73 /min  Temperature 97.3 F / 36.2 C  BMI 39.5 kg/m   GU PHYSICAL EXAMINATION:    Scrotum: No lesions. No edema. No cysts. No warts.  Epididymides: Right: no spermatocele, no masses, no cysts, no tenderness, no induration, no enlargement. Left: no spermatocele, no masses, no cysts, no tenderness, no induration, no enlargement.  Testes: No tenderness, no swelling, no enlargement left testes. No tenderness, no swelling, no enlargement right testes. Normal location  left testes. Normal location right testes. No mass, no cyst, no varicocele, no hydrocele left testes. No mass, no cyst, no varicocele, no hydrocele right testes.  Urethral Meatus: Normal size. No lesion, no wart, no discharge, no polyp. Normal location.  Penis: Penis uncircumcised. No foreskin warts, no cracks. No dorsal peyronie's plaques, no left corporal peyronie's plaques, no right corporal peyronie's plaques, no scarring, no shaft warts. No balanitis, no meatal stenosis.    MULTI-SYSTEM PHYSICAL EXAMINATION:    Constitutional: Well-nourished. No physical deformities. Normally developed. Good grooming.  Neurologic / Psychiatric: Oriented to time, oriented to place, oriented to person. No depression, no anxiety, no agitation.  Gastrointestinal: Obese abdomen. No mass, no tenderness, no rigidity.   Musculoskeletal: Normal gait and station of head and neck.  Respiratory:  CTAB Cardiovascular:  RRR, palpable distal pulses    Complexity of Data:  Records Review:   Previous Patient Records  X-Ray Review: C.T. Abdomen/Pelvis: Reviewed Films. Reviewed Report. Discussed With Patient.    Notes:  CLINICAL DATA: 61 year old male with history of renal cell  carcinoma, tumor reportedly on the LEFT, scheduled for surgery.Marland Kitchen   EXAM:  CT ABDOMEN WITH CONTRAST   TECHNIQUE:  Multidetector CT imaging of the abdomen was performed using the  standard protocol following bolus administration of intravenous  contrast.   CONTRAST: 100 mL Omnipaque 300   COMPARISON: MRI of the abdomen of September 20, 2020.   FINDINGS:  Lower chest: Incidental imaging of the lung bases without effusion  or sign of consolidative changes.   Hepatobiliary: Hepatic steatosis. No focal, suspicious hepatic  lesion. The portal vein is patent. No pericholecystic stranding. No  biliary duct dilation.   Pancreas: Normal, without mass, inflammation or ductal dilatation.   Spleen: Normal size and contour without  visible lesion.   Adrenals/Urinary Tract: Adrenal glands are normal.   Large LEFT renal mass with infiltrative appearance in the lower pole  and more circumscribed appearance in the interpolar aspect of the  LEFT kidney extends inferiorly in an exophytic manner from the LEFT  kidney measuring approximately 8.8 x 7.9 cm as it extends inferiorly  from the lower pole the LEFT kidney. This measured approximately 8.4  x 0.6 cm on the prior exam.   At the level of the interpolar LEFT kidney where there is gross  extension into the LEFT renal vein tumor and renal vein involvement  as a total measurement at 12.2 x 8.7 cm on the prior study,  currently approximately 12.5 x 9.8 cm.   Extension into the renal vein with expansion of the renal vein just  short of the confluence with the IVC. Renal venous involvement  better delineated on the MRI evaluation but grossly similar when  compared to the prior study.   No gross extension beyond Gerota's fascia. Poorly enhancing  parenchyma, normal parenchyma that remains in the upper pole and  posterior interpolar LEFT kidney. Numerous collateral pathways  throughout the LEFT retroperitoneum due to large hypervascular  tumor. Small renal calculus in the posterior LEFT kidney. Tumor in  close proximity to the pancreas but separate from the pancreas on  sagittal images. Small abnormal density lymph nodes in the renal  hilum as seen on the recent CT evaluation.   RIGHT kidney with mild cortical scarring and nephrolithiasis in the  upper pole. 4 mm calculus in the upper pole the RIGHT kidney.   Stomach/Bowel: Bowel is displaced anteriorly secondary to the large  mass arising from the LEFT kidney. No acute bowel process.   Vascular/Lymphatic: Vascular involvement of LEFT renal vein as  described.   Other: No ascites. Very little contrast excretion noted on delayed  imaging from the LEFT kidney.   Musculoskeletal: No acute or significant osseous  findings.   IMPRESSION:  1. Large LEFT renal mass with extension of tumor into LEFT renal  vein, perhaps slightly enlarged compared to recent abdominal MRI.  2. Small abnormal density lymph nodes in the renal hilum as seen on  the recent MRI evaluation evaluation.  3. Hepatic steatosis.  4. Bilateral nephrolithiasis.    Electronically Signed  By: Zetta Bills M.D.  On: 11/03/2020 10:51     PROCEDURES:          Cath Irrigation - 51700 24 French 2-way Foley inserted with return of blood tinged urine with numerous clots. The catheter was extensively hand irrigated until no additional clots were seen. The catheter was then placed to gravity drainage and secured to a leg bag.    ASSESSMENT:  ICD-10 Details  1 GU:   Gross hematuria - R31.0 Chronic, Worsening  2   Renal cell carcinoma, left - C64.2    PLAN:           Orders Labs Urine Culture CATH          Schedule Return Visit/Planned Activity: Keep Scheduled Appointment          Document Letter(s):  Created for Patient: Clinical Summary         Notes:  -I reviewed imaging results and films with the patient personally. We discussed that the mass in question has features concerning for malignancy. I explained the natural history of presumed renal cell carcinoma. I reviewed the AUA guidelines for evaluation and treatment of renal masses. The options of active surveillance, in situ tumor ablation, partial and radical nephrectomy was discussed. The risks of robot-assist laparoscopic LEFT radical nephrectomy with possible tumor thrombectomy were discussed in detail including but not limited to: negative pathology, open conversion, infection of the skin/abdominal cavity, VTE, MI/CVA, lymphatic leak, injury to adjacent solid/hollow viscus organs, bleeding requiring a blood transfusion, catastrophic bleeding, hernia formation and other imponderables. The patient voices understanding and wishes to proceed.

## 2020-11-08 NOTE — Anesthesia Postprocedure Evaluation (Signed)
Anesthesia Post Note  Patient: Daniel Herring  Procedure(s) Performed: XI ROBOTIC ASSISTED LEFT LAPAROSCOPIC RADICAL NEPHRECTOMY (N/A )     Patient location during evaluation: PACU Anesthesia Type: General Level of consciousness: sedated Pain management: pain level controlled Vital Signs Assessment: post-procedure vital signs reviewed and stable Respiratory status: spontaneous breathing and respiratory function stable Cardiovascular status: stable Postop Assessment: no apparent nausea or vomiting Anesthetic complications: no   No complications documented.  Last Vitals:  Vitals:   11/08/20 1315 11/08/20 1330  BP: 123/72 123/73  Pulse: (!) 59 (!) 55  Resp: 14 13  Temp:    SpO2: 91% 92%    Last Pain:  Vitals:   11/08/20 1330  TempSrc:   PainSc: Asleep                 Lane Kjos DANIEL

## 2020-11-08 NOTE — Anesthesia Procedure Notes (Signed)
Procedure Name: Intubation Date/Time: 11/08/2020 7:21 AM Performed by: Lollie Sails, CRNA Pre-anesthesia Checklist: Patient identified, Emergency Drugs available, Suction available, Patient being monitored and Timeout performed Patient Re-evaluated:Patient Re-evaluated prior to induction Oxygen Delivery Method: Circle system utilized Preoxygenation: Pre-oxygenation with 100% oxygen Induction Type: IV induction Laryngoscope Size: Miller and 3 Grade View: Grade I Tube type: Oral Tube size: 7.5 mm Number of attempts: 1 Airway Equipment and Method: Stylet Placement Confirmation: ETT inserted through vocal cords under direct vision,  positive ETCO2 and breath sounds checked- equal and bilateral Secured at: 25 cm Tube secured with: Tape Dental Injury: Teeth and Oropharynx as per pre-operative assessment

## 2020-11-08 NOTE — Progress Notes (Signed)
Pt's CBG 226; orders for sliding scale placed by Dr. Lovena Neighbours.  Spoke with Dr. Tobias Alexander concerning discontinuing the insulin drip and using the sliding scale.  Orders placed for 3 units one time in PACU and recheck CBG in 1 hr prior to discharge to floor

## 2020-11-08 NOTE — Transfer of Care (Signed)
Immediate Anesthesia Transfer of Care Note  Patient: Daniel Herring  Procedure(s) Performed: XI ROBOTIC ASSISTED LEFT LAPAROSCOPIC RADICAL NEPHRECTOMY (N/A )  Patient Location: PACU  Anesthesia Type:General  Level of Consciousness: awake, drowsy and responds to stimulation  Airway & Oxygen Therapy: Patient Spontanous Breathing and Patient connected to face mask oxygen  Post-op Assessment: Report given to RN and Post -op Vital signs reviewed and stable  Post vital signs: Reviewed and stable  Last Vitals:  Vitals Value Taken Time  BP 136/70 11/08/20 1220  Temp    Pulse 64 11/08/20 1221  Resp 15 11/08/20 1221  SpO2 99 % 11/08/20 1221  Vitals shown include unvalidated device data.  Last Pain:  Vitals:   11/08/20 0617  TempSrc:   PainSc: 0-No pain      Patients Stated Pain Goal: 4 (33/83/29 1916)  Complications: No complications documented.

## 2020-11-09 ENCOUNTER — Encounter (HOSPITAL_COMMUNITY): Payer: Self-pay | Admitting: Urology

## 2020-11-09 ENCOUNTER — Other Ambulatory Visit (HOSPITAL_COMMUNITY): Payer: Self-pay

## 2020-11-09 LAB — CBC
HCT: 27.2 % — ABNORMAL LOW (ref 39.0–52.0)
Hemoglobin: 8.8 g/dL — ABNORMAL LOW (ref 13.0–17.0)
MCH: 28.4 pg (ref 26.0–34.0)
MCHC: 32.4 g/dL (ref 30.0–36.0)
MCV: 87.7 fL (ref 80.0–100.0)
Platelets: 270 10*3/uL (ref 150–400)
RBC: 3.1 MIL/uL — ABNORMAL LOW (ref 4.22–5.81)
RDW: 14.9 % (ref 11.5–15.5)
WBC: 9.6 10*3/uL (ref 4.0–10.5)
nRBC: 0 % (ref 0.0–0.2)

## 2020-11-09 LAB — BASIC METABOLIC PANEL
Anion gap: 7 (ref 5–15)
BUN: 16 mg/dL (ref 8–23)
CO2: 28 mmol/L (ref 22–32)
Calcium: 8.4 mg/dL — ABNORMAL LOW (ref 8.9–10.3)
Chloride: 98 mmol/L (ref 98–111)
Creatinine, Ser: 1.27 mg/dL — ABNORMAL HIGH (ref 0.61–1.24)
GFR, Estimated: >60 mL/min (ref >60)
Glucose, Bld: 214 mg/dL — ABNORMAL HIGH (ref 70–99)
Potassium: 4.6 mmol/L (ref 3.5–5.1)
Sodium: 133 mmol/L — ABNORMAL LOW (ref 135–145)

## 2020-11-09 LAB — HEMOGLOBIN AND HEMATOCRIT, BLOOD
HCT: 29.7 % — ABNORMAL LOW (ref 39.0–52.0)
Hemoglobin: 9.5 g/dL — ABNORMAL LOW (ref 13.0–17.0)

## 2020-11-09 LAB — GLUCOSE, CAPILLARY
Glucose-Capillary: 160 mg/dL — ABNORMAL HIGH (ref 70–99)
Glucose-Capillary: 238 mg/dL — ABNORMAL HIGH (ref 70–99)
Glucose-Capillary: 238 mg/dL — ABNORMAL HIGH (ref 70–99)
Glucose-Capillary: 251 mg/dL — ABNORMAL HIGH (ref 70–99)

## 2020-11-09 NOTE — Plan of Care (Signed)

## 2020-11-09 NOTE — Progress Notes (Signed)
Urology Progress Note   1 Day Post-Op  Subjective: NAEON.  VSS 1u pRBC intra-op Hgb stable overnight but slighlty decreased this am Tolerating clears without n/v Catheter removed this am, still undergoing TOV Has not ambulated  Objective: Vital signs in last 24 hours: Temp:  [97.3 F (36.3 C)-98.2 F (36.8 C)] 97.7 F (36.5 C) (05/26 0515) Pulse Rate:  [55-72] 60 (05/26 0515) Resp:  [13-18] 16 (05/26 0515) BP: (123-156)/(65-83) 137/74 (05/26 0515) SpO2:  [91 %-99 %] 94 % (05/26 0515)  Intake/Output from previous day: 05/25 0701 - 05/26 0700 In: 7328.8 [P.O.:640; I.V.:5423.8; Blood:315; IV Piggyback:950] Out: 3600 [Urine:2500; Blood:1100] Intake/Output this shift: No intake/output data recorded.  Physical Exam:  General: Alert and oriented CV: RRR Lungs: Clear Abdomen: Soft, appropriately tender. Incisions c/d/i. JP SS GU: incisions c.d.i, foley removed this am Ext: NT, No erythema  Lab Results: Recent Labs    11/08/20 1236 11/08/20 1927 11/09/20 0420  HGB 9.5* 9.4* 8.8*  HCT 29.9* 29.1* 27.2*   BMET Recent Labs    11/09/20 0420  NA 133*  K 4.6  CL 98  CO2 28  GLUCOSE 214*  BUN 16  CREATININE 1.27*  CALCIUM 8.4*     Studies/Results: No results found.  Assessment/Plan:  60 y.o. male s/p L Radical nephrectomy on 5/25. Recovering appropriately.    - pain control as needed - slowly advance to regular diet - will medlock once tolerating more fluids - encourage ambulation - encourage IS - foley removed, undergoing TOV today - SSI - repeat Hgb midday   LOS: 1 day   Tressa Maldonado 11/09/2020, 7:55 AM

## 2020-11-10 ENCOUNTER — Other Ambulatory Visit (HOSPITAL_COMMUNITY): Payer: Self-pay

## 2020-11-10 LAB — GLUCOSE, CAPILLARY
Glucose-Capillary: 197 mg/dL — ABNORMAL HIGH (ref 70–99)
Glucose-Capillary: 224 mg/dL — ABNORMAL HIGH (ref 70–99)
Glucose-Capillary: 335 mg/dL — ABNORMAL HIGH (ref 70–99)
Glucose-Capillary: 190 mg/dL — ABNORMAL HIGH (ref 70–99)

## 2020-11-10 LAB — BASIC METABOLIC PANEL
Anion gap: 9 (ref 5–15)
BUN: 11 mg/dL (ref 8–23)
CO2: 23 mmol/L (ref 22–32)
Calcium: 8.4 mg/dL — ABNORMAL LOW (ref 8.9–10.3)
Chloride: 100 mmol/L (ref 98–111)
Creatinine, Ser: 0.95 mg/dL (ref 0.61–1.24)
GFR, Estimated: >60 mL/min (ref >60)
Glucose, Bld: 202 mg/dL — ABNORMAL HIGH (ref 70–99)
Potassium: 4.3 mmol/L (ref 3.5–5.1)
Sodium: 132 mmol/L — ABNORMAL LOW (ref 135–145)

## 2020-11-10 LAB — CBC
HCT: 27.5 % — ABNORMAL LOW (ref 39.0–52.0)
Hemoglobin: 8.5 g/dL — ABNORMAL LOW (ref 13.0–17.0)
MCH: 27.4 pg (ref 26.0–34.0)
MCHC: 30.9 g/dL (ref 30.0–36.0)
MCV: 88.7 fL (ref 80.0–100.0)
Platelets: 246 10*3/uL (ref 150–400)
RBC: 3.1 MIL/uL — ABNORMAL LOW (ref 4.22–5.81)
RDW: 15.3 % (ref 11.5–15.5)
WBC: 12.2 10*3/uL — ABNORMAL HIGH (ref 4.0–10.5)
nRBC: 0 % (ref 0.0–0.2)

## 2020-11-10 MED ORDER — HYDROCODONE-ACETAMINOPHEN 5-325 MG PO TABS
1.0000 | ORAL_TABLET | ORAL | 0 refills | Status: DC | PRN
  Filled 2020-11-10: qty 20, 4d supply, fill #0

## 2020-11-10 MED ORDER — ONDANSETRON HCL 4 MG PO TABS
4.0000 mg | ORAL_TABLET | Freq: Every day | ORAL | 1 refills | Status: AC | PRN
  Filled 2020-11-10: qty 30, 30d supply, fill #0

## 2020-11-10 MED ORDER — TAMSULOSIN HCL 0.4 MG PO CAPS
0.4000 mg | ORAL_CAPSULE | Freq: Every day | ORAL | 0 refills | Status: DC
  Filled 2020-11-10: qty 30, 30d supply, fill #0

## 2020-11-10 MED ORDER — CHLORHEXIDINE GLUCONATE CLOTH 2 % EX PADS
6.0000 | MEDICATED_PAD | Freq: Every day | CUTANEOUS | Status: DC
  Administered 2020-11-10 – 2020-11-13 (×3): 6 via TOPICAL

## 2020-11-10 MED ORDER — OXYBUTYNIN CHLORIDE 5 MG PO TABS
5.0000 mg | ORAL_TABLET | Freq: Three times a day (TID) | ORAL | 1 refills | Status: DC | PRN
  Filled 2020-11-10: qty 30, 10d supply, fill #0

## 2020-11-10 MED ORDER — SODIUM CHLORIDE 0.9 % IR SOLN
3000.0000 mL | Status: DC
  Administered 2020-11-10: 3000 mL

## 2020-11-10 MED ORDER — SODIUM CHLORIDE 0.9 % IV SOLN: INTRAVENOUS | Status: DC

## 2020-11-10 NOTE — Progress Notes (Signed)
Inpatient Diabetes Program Recommendations  AACE/ADA: New Consensus Statement on Inpatient Glycemic Control (2015)  Target Ranges:  Prepandial:   less than 140 mg/dL      Peak postprandial:   less than 180 mg/dL (1-2 hours)      Critically ill patients:  140 - 180 mg/dL   Lab Results  Component Value Date   GLUCAP 197 (H) 11/10/2020   HGBA1C 8.8 (H) 06/23/2020    Review of Glycemic Control Results for Daniel Herring, Daniel Herring (MRN [id number]) as of 11/10/2020 09:15  Ref. Range 11/09/2020 07:26 11/09/2020 11:47 11/09/2020 16:58 11/09/2020 20:40 11/10/2020 07:05  Glucose-Capillary Latest Ref Range: 70 - 99 mg/dL 160 (H) 238 (H) 238 (H) 251 (H) 197 (H)    Inpatient Diabetes Program Recommendations:     Please consider:  Novolog 3 units TID with meals if eats at least 50%.  Will continue to follow while inpatient.  Thank you, Reche Dixon, RN, BSN Diabetes Coordinator Inpatient Diabetes Program [phone] (team pager from 8a-5p)

## 2020-11-10 NOTE — Progress Notes (Signed)
Per MD orders 3 way 24 French foley inserted. Pt tolerated procedure well and immediate return of 1000 bloody urine.

## 2020-11-10 NOTE — Progress Notes (Addendum)
Urology Progress Note   2 Days Post-Op  Subjective: NAEON.  Now on O2 (has not been using IS or OOB) Hgb stable Tolerating diet without n/v Passing gas Continued hematuria  Objective: Vital signs in last 24 hours: Temp:  [98.2 F (36.8 C)-98.5 F (36.9 C)] 98.5 F (36.9 C) (05/27 0500) Pulse Rate:  [79-96] 96 (05/27 0500) Resp:  [16-18] 18 (05/27 0500) BP: (133-154)/(65-72) 154/72 (05/27 0500) SpO2:  [90 %-95 %] 95 % (05/27 0500)  Intake/Output from previous day: 05/26 0701 - 05/27 0700 In: 974.9 [P.O.:120; I.V.:854.9] Out: 250 [Urine:250] Intake/Output this shift: No intake/output data recorded.  Physical Exam:  General: Alert and oriented CV: RRR Lungs:breathing comfortably on [place] Abdomen: Soft, appropriately tender. Incisions c/d/i. JP SS GU: incisions c.d.i Ext: NT, No erythema  Lab Results: Recent Labs    11/09/20 0420 11/09/20 1203 11/10/20 0512  HGB 8.8* 9.5* 8.5*  HCT 27.2* 29.7* 27.5*   BMET Recent Labs    11/09/20 0420 11/10/20 0512  NA 133* 132*  K 4.6 4.3  CL 98 100  CO2 28 23  GLUCOSE 214* 202*  BUN 16 11  CREATININE 1.27* 0.95  CALCIUM 8.4* 8.4*     Studies/Results: No results found.  Assessment/Plan:  60 y.o. male s/p L Radical nephrectomy on 5/25. Recovering appropriately.    - pain control as needed - regular diet - medlock - PT consulted - encourage ambulation - encourage IS - SSI   LOS: 2 days   Alysen [facility] 11/10/2020, 8:13 AM    Attending Attestation:  Patient seen and examined.  I agree with the assessment and plan as outlined by Dr. Al Pimple.  He continues to have gross hematuria associated with urinary urgency and incontinence.  Concern for residual clot burden causing bladder outlet obstruction.  Order for Foley insertion and irrigation placed.  Will continue to monitor.

## 2020-11-10 NOTE — Evaluation (Signed)
Physical Therapy Evaluation Patient Details Name: Daniel Herring MRN: [id number] DOB: 1961/01/16 Today's Date: 11/10/2020   History of Present Illness  Pt admitted with hematuria and now s/p L radical nephrectomy 2* renal cell carcinoma.  Pt with hx of DM  Clinical Impression  Pt admitted as above and demonstrating ability to perform transfers and ambulate unassisted including to/from bathroom.  Pt moving tentatively so not quite at baseline but with no LOB and min dyspnea noted.  Discussed with RN and will turn pt to nursing to encourage ambulation but pt with no need for skilled PT at this time and PT service will sign off.    Follow Up Recommendations No PT follow up    Equipment Recommendations  None recommended by PT    Recommendations for Other Services       Precautions / Restrictions Precautions Precautions: Fall Restrictions Weight Bearing Restrictions: No      Mobility  Bed Mobility               General bed mobility comments: Pt up in chair on arrival to room and in bathroom at session end    Transfers Overall transfer level: Needs assistance Equipment used: None Transfers: Sit to/from Stand Sit to Stand: Modified independent (Device/Increase time)            Ambulation/Gait Ambulation/Gait assistance: Supervision;Independent Gait Distance (Feet): 450 Feet Assistive device: None Gait Pattern/deviations: Step-through pattern;Decreased step length - right;Decreased step length - left;Shuffle;Wide base of support Gait velocity: mod pace   General Gait Details: mild instability with noted increased BOS and mildy guarded gait.  No LOB noted and pt with good safety awareness.  Stairs            Wheelchair Mobility    Modified Rankin (Stroke Patients Only)       Balance Overall balance assessment: Mild deficits observed, not formally tested                                           Pertinent Vitals/Pain Pain  Assessment: Faces Faces Pain Scale: Hurts a little bit Pain Location: shoulder pain Pain Descriptors / Indicators: Aching Pain Intervention(s): Limited activity within patient's tolerance;Monitored during session;Premedicated before session    Home Living Family/patient expects to be discharged to:: Private residence Living Arrangements: Spouse/significant other Available Help at Discharge: Family Type of Home: Apartment Home Access: Stairs to enter   Technical brewer of Steps: "a few" Home Layout: One level Home Equipment: None      Prior Function Level of Independence: Independent         Comments: working in data entry     Journalist, newspaper        Extremity/Trunk Assessment   Upper Extremity Assessment Upper Extremity Assessment: Overall WFL for tasks assessed    Lower Extremity Assessment Lower Extremity Assessment: Overall WFL for tasks assessed    Cervical / Trunk Assessment Cervical / Trunk Assessment: Normal  Communication   Communication: No difficulties  Cognition Arousal/Alertness: Awake/alert Behavior During Therapy: WFL for tasks assessed/performed Overall Cognitive Status: Within Functional Limits for tasks assessed                                        General Comments      Exercises  Assessment/Plan    PT Assessment Patent does not need any further PT services  PT Problem List         PT Treatment Interventions      PT Goals (Current goals can be found in the Care Plan section)  Acute Rehab PT Goals PT Goal Formulation: All assessment and education complete, DC therapy    Frequency     Barriers to discharge        Co-evaluation               AM-PAC PT "6 Clicks" Mobility  Outcome Measure Help needed turning from your back to your side while in a flat bed without using bedrails?: None Help needed moving from lying on your back to sitting on the side of a flat bed without using bedrails?:  None Help needed moving to and from a bed to a chair (including a wheelchair)?: None Help needed standing up from a chair using your arms (e.g., wheelchair or bedside chair)?: None Help needed to walk in hospital room?: None Help needed climbing 3-5 steps with a railing? : None 6 Click Score: 24    End of Session   Activity Tolerance: Patient tolerated treatment well Patient left: Other (comment) (bathroom) Nurse Communication: Mobility status PT Visit Diagnosis: Difficulty in walking, not elsewhere classified (R26.2)    Time: 2595-6387 PT Time Calculation (min) (ACUTE ONLY): 12 min   Charges:   PT Evaluation $PT Eval Low Complexity: 1 Low          Debe Coder PT Acute Rehabilitation Services Pager [phone] Office [phone]   Dazaria Macneill 11/10/2020, 12:13 PM

## 2020-11-11 LAB — HEMOGLOBIN AND HEMATOCRIT, BLOOD
HCT: 28 % — ABNORMAL LOW (ref 39.0–52.0)
Hemoglobin: 9 g/dL — ABNORMAL LOW (ref 13.0–17.0)

## 2020-11-11 LAB — BASIC METABOLIC PANEL WITH GFR
Anion gap: 6 (ref 5–15)
BUN: 16 mg/dL (ref 8–23)
CO2: 28 mmol/L (ref 22–32)
Calcium: 8.5 mg/dL — ABNORMAL LOW (ref 8.9–10.3)
Chloride: 100 mmol/L (ref 98–111)
Creatinine, Ser: 1.26 mg/dL — ABNORMAL HIGH (ref 0.61–1.24)
GFR, Estimated: >60 mL/min
Glucose, Bld: 197 mg/dL — ABNORMAL HIGH (ref 70–99)
Potassium: 4.1 mmol/L (ref 3.5–5.1)
Sodium: 134 mmol/L — ABNORMAL LOW (ref 135–145)

## 2020-11-11 LAB — GLUCOSE, CAPILLARY
Glucose-Capillary: 237 mg/dL — ABNORMAL HIGH (ref 70–99)
Glucose-Capillary: 178 mg/dL — ABNORMAL HIGH (ref 70–99)
Glucose-Capillary: 221 mg/dL — ABNORMAL HIGH (ref 70–99)
Glucose-Capillary: 309 mg/dL — ABNORMAL HIGH (ref 70–99)

## 2020-11-11 NOTE — Progress Notes (Signed)
Patient ID: Daniel Herring, male   DOB: 1960-08-05, 60 y.o.   MRN: [id number]  3 Days Post-Op Subjective: Pt doing well this morning.  Required catheter replacement yesterday with CBI/irrigation due to clot retention.  No problems with catheter drainage overnight.  Still on oxygen 2 L.  Tolerating diet.  Ambulated with PT but not very ambulatory since catheter replaced.  Objective: Vital signs in last 24 hours: Temp:  [98.1 F (36.7 C)-98.4 F (36.9 C)] 98.2 F (36.8 C) (05/28 0657) Pulse Rate:  [70-107] 70 (05/28 0657) Resp:  [20] 20 (05/28 0657) BP: (130-142)/(72-76) 130/72 (05/28 0657) SpO2:  [96 %-100 %] 96 % (05/28 0657)  Intake/Output from previous day: 05/27 0701 - 05/28 0700 In: 25561.3 [P.O.:840; I.V.:871.3] Out: 29075 [Urine:29075] Intake/Output this shift: Total I/O In: 3000 [Other:3000] Out: 7300 [Urine:7300]  Physical Exam:  General: Alert and oriented CV: RRR Lungs: Clear Abdomen: Soft, ND, NT Incisions: C/D/I GU: Urine clear off CBI Ext: NT, No erythema  Lab Results: Recent Labs    11/09/20 1203 11/10/20 0512 11/11/20 0402  HGB 9.5* 8.5* 9.0*  HCT 29.7* 27.5* 28.0*   BMET Recent Labs    11/10/20 0512 11/11/20 0402  NA 132* 134*  K 4.3 4.1  CL 100 100  CO2 23 28  GLUCOSE 202* 197*  BUN 11 16  CREATININE 0.95 1.26*  CALCIUM 8.4* 8.5*     Studies/Results: No results found.  Assessment/Plan: POD # 3 s/p right RAL radical nephrectomy - Will d/c catheter now that urine is clear and there is no source for ongoing bleeding.  This will hopefully facilitate improved ambulation today. - Wean oxygen off - SL IVF - Oral pain medication - Regular diet - If ambulating better and voiding ok by tomorrow, should be ready for discharge at that time   LOS: 3 days   Dutch Gray 11/11/2020, 11:42 AM

## 2020-11-12 ENCOUNTER — Inpatient Hospital Stay (HOSPITAL_COMMUNITY): Payer: BC Managed Care – PPO

## 2020-11-12 LAB — TYPE AND SCREEN
ABO/RH(D): A POS
Antibody Screen: NEGATIVE
Unit division: 0
Unit division: 0

## 2020-11-12 LAB — BPAM RBC
Blood Product Expiration Date: 202206212359
Blood Product Expiration Date: 202206212359
ISSUE DATE / TIME: 202205250949
ISSUE DATE / TIME: 202205250949
Unit Type and Rh: 6200
Unit Type and Rh: 6200

## 2020-11-12 LAB — GLUCOSE, CAPILLARY
Glucose-Capillary: 199 mg/dL — ABNORMAL HIGH (ref 70–99)
Glucose-Capillary: 227 mg/dL — ABNORMAL HIGH (ref 70–99)
Glucose-Capillary: 251 mg/dL — ABNORMAL HIGH (ref 70–99)
Glucose-Capillary: 148 mg/dL — ABNORMAL HIGH (ref 70–99)

## 2020-11-12 NOTE — Progress Notes (Signed)
Patient have been voiding frequently 25cc to 100cc bloody urine with clots, bladder scan 875, Dr. Jeffie Pollock notified, order given to re-insert foley cath, patient/wife updated.

## 2020-11-12 NOTE — Progress Notes (Signed)
Patient ID: Daniel Herring, male   DOB: 1961-04-09, 60 y.o.   MRN: [id number]  Rochele Pages had increased difficult voiding and a PVR was 892ml.   A 36fr 3-way was placed and will be hand irrigated prn for the time being.  The [place] was done this morning and he has a small amount of layered debris in the bladder consistent with clot.  He didn't have the typical pain associated with acute retention but feels better with drainage.  The urine in the tubing is dark and mildly bloody.    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    I will recheck labs in the AM and reirrigate as needed.   He will probably need discharge with the foley.

## 2020-11-12 NOTE — Progress Notes (Signed)
Patient ID: Daniel Herring, male   DOB: 1960/09/18, 60 y.o.   MRN: [id number]  4 Days Post-Op Subjective: Pt continue to have some hematuria with a few clots.  He is concerned about the risks of recurrent retention with the hematuria and doesn't feel comfortable for D/C.   Objective: Vital signs in last 24 hours: Temp:  [97.7 F (36.5 C)-98.3 F (36.8 C)] 97.7 F (36.5 C) (05/29 0420) Pulse Rate:  [73-83] 73 (05/29 0420) Resp:  [16-20] 18 (05/29 0420) BP: (132-155)/(66-81) 155/81 (05/29 0420) SpO2:  [89 %-93 %] 91 % (05/29 0420)  Intake/Output from previous day: 05/28 0701 - 05/29 0700 In: 3480 [P.O.:480] Out: 8100 [Urine:8100] Intake/Output this shift: No intake/output data recorded.  Physical Exam:  General: Alert and oriented CV: RRR Lungs: Clear Abdomen: Soft, ND, NT Incisions: C/D/I Ext: NT, No erythema  Lab Results: Recent Labs    11/09/20 1203 11/10/20 0512 11/11/20 0402  HGB 9.5* 8.5* 9.0*  HCT 29.7* 27.5* 28.0*   BMET Recent Labs    11/10/20 0512 11/11/20 0402  NA 132* 134*  K 4.3 4.1  CL 100 100  CO2 23 28  GLUCOSE 202* 197*  BUN 11 16  CREATININE 0.95 1.26*  CALCIUM 8.4* 8.5*     Studies/Results: No results found.  Assessment/Plan: POD # 4 s/p right RAL radical nephrectomy - He has persistent hematuria with a few clots.  I will get a bladder US to assess for retained clots and will repeat an H&H in the AM and will order a string of bottles.  Will reassess for D/C in the AM.    LOS: 4 days   Irine Seal 11/12/2020, 9:33 AM  Patient ID: Daniel Herring, male   DOB: [birth year], 59 y.o.   MRN: [id number]

## 2020-11-12 NOTE — Progress Notes (Signed)
Foley 78fr inserted, patient tolerated it well, 950cc bloody urine out, no clot noted, will continue to assess patient.

## 2020-11-13 DIAGNOSIS — R310 Gross hematuria: Secondary | ICD-10-CM | POA: Diagnosis present

## 2020-11-13 DIAGNOSIS — R339 Retention of urine, unspecified: Secondary | ICD-10-CM | POA: Diagnosis not present

## 2020-11-13 LAB — HEMOGLOBIN AND HEMATOCRIT, BLOOD
HCT: 26.5 % — ABNORMAL LOW (ref 39.0–52.0)
Hemoglobin: 8.5 g/dL — ABNORMAL LOW (ref 13.0–17.0)

## 2020-11-13 LAB — SURGICAL PATHOLOGY

## 2020-11-13 LAB — GLUCOSE, CAPILLARY: Glucose-Capillary: 209 mg/dL — ABNORMAL HIGH (ref 70–99)

## 2020-11-13 NOTE — Discharge Summary (Signed)
Physician Discharge Summary  Patient ID: Daniel Herring MRN: [id number] DOB/AGE: [birth year] 62 y.o.  Admit date: 11/08/2020 Discharge date: 11/13/2020  Admission Diagnoses:  Renal mass  Discharge Diagnoses:  Principal Problem:   Renal mass Active Problems:   Gross hematuria   Urinary retention   Past Medical History:  Diagnosis Date  . Cancer [facility])    renal cancer   . Chronic kidney disease   . Diabetes mellitus without complication ([place])    type 2   . History of kidney stones   . Hyperlipidemia   . Hypertension     Surgeries: Procedure(s): XI ROBOTIC ASSISTED LEFT LAPAROSCOPIC RADICAL NEPHRECTOMY on 11/08/2020   Consultants (if any): Treatment Team:  Ceasar Mons, MD  Discharged Condition: Improved  Hospital Course: Daniel Herring is an 60 y.o. male who was admitted 11/08/2020 with a diagnosis of Renal mass and went to the operating room on 11/08/2020 and underwent the above named procedures.  He had hematuria that was felt to be from the renal mass but his post op course was complicated by clot retention that required severe episodes with a foley and irrigation.  He had the foley replaced the day of discharge with a final irrigation to clear out the old clot.  He will be discharged home with a foley.   He has a stable acute blood loss anemia.   He was given perioperative antibiotics:  Anti-infectives (From admission, onward)   Start     Dose/Rate Route Frequency Ordered Stop   11/08/20 1143  ceFAZolin (ANCEF) 3-0.9 GM/100ML-% IVPB       Note to Pharmacy: Lavon Paganini   : cabinet override      11/08/20 1143 11/08/20 1146   11/08/20 0530  ceFAZolin (ANCEF) IVPB 3g/100 mL premix        3 g 200 mL/hr over 30 Minutes Intravenous  Once 11/08/20 0515 11/08/20 1145    .  He was given sequential compression devices and early ambulation for DVT prophylaxis.  He benefited maximally from the hospital stay and there were no complications.    Recent vital signs:   Vitals:   11/12/20 2023 11/13/20 0100  BP: (!) 165/74 (!) 149/89  Pulse: 71 74  Resp: 18 18  Temp: 97.6 F (36.4 C)   SpO2: 94% 94%    Recent laboratory studies:  Lab Results  Component Value Date   HGB 8.5 (L) 11/13/2020   HGB 9.0 (L) 11/11/2020   HGB 8.5 (L) 11/10/2020   Lab Results  Component Value Date   WBC 12.2 (H) 11/10/2020   PLT 246 11/10/2020   Lab Results  Component Value Date   INR 1.0 10/09/2020   Lab Results  Component Value Date   NA 134 (L) 11/11/2020   K 4.1 11/11/2020   CL 100 11/11/2020   CO2 28 11/11/2020   BUN 16 11/11/2020   CREATININE 1.26 (H) 11/11/2020   GLUCOSE 197 (H) 11/11/2020    Discharge Medications:   Allergies as of 11/13/2020      Reactions   Farxiga [dapagliflozin]    Blood in urine       Medication List    TAKE these medications   aspirin 81 MG chewable tablet Chew 81 mg by mouth every evening.   B COMPLEX VITAMINS PO Take 1 tablet by mouth 4 (four) times a week. Gummies   Bydureon BCise 2 MG/0.85ML Auij Generic drug: Exenatide ER Inject 2 mg into the skin every Saturday.   Emergen-C Immune Pack  Take 1 packet by mouth daily as needed (immune support).   glipiZIDE 10 MG 24 hr tablet Commonly known as: GLUCOTROL XL Take 10 mg by mouth every evening.   HYDROcodone-acetaminophen 5-325 MG tablet Commonly known as: Norco Take 1 tablet by mouth every 4 (four) hours as needed for moderate pain.   lisinopril 40 MG tablet Commonly known as: ZESTRIL Take 40 mg by mouth every evening.   metFORMIN 500 MG tablet Commonly known as: GLUCOPHAGE Take 1,000 mg by mouth 2 (two) times daily.   multivitamin with minerals Tabs tablet Take 1 tablet by mouth 4 (four) times a week.   naproxen sodium 220 MG tablet Commonly known as: ALEVE Take 220-440 mg by mouth 2 (two) times daily as needed (headaches/pain).   ondansetron 4 MG tablet Commonly known as: Zofran Take 1 tablet (4 mg total) by mouth daily as needed for nausea  or vomiting.   pioglitazone 15 MG tablet Commonly known as: ACTOS Take 15 mg by mouth every evening.   pravastatin 80 MG tablet Commonly known as: PRAVACHOL Take 80 mg by mouth every evening.   tamsulosin 0.4 MG Caps capsule Commonly known as: FLOMAX Take 1 capsule (0.4 mg total) by mouth daily.   ZINC PO Take 2 tablets by mouth daily as needed (immune support).       Diagnostic Studies: US PELVIS LIMITED (TRANSABDOMINAL ONLY)  Result Date: 11/12/2020 CLINICAL DATA:  Gross hematuria, status post LEFT nephrectomy. EXAM: LIMITED ULTRASOUND OF PELVIS TECHNIQUE: Limited transabdominal ultrasound examination of the pelvis was performed. COMPARISON:  September 14, 2020 FINDINGS: The urinary bladder demonstrates heterogeneous material along the posterior aspect. Heterogeneous layering material measures 5.1 x 2.8 x 3.2 cm. No definitive internal blood flow noted. IMPRESSION: There is debris within the bladder which may reflect blood clot. No definitive internal blood flow. Given that similar debris was present on September 14, 2020, recommend short term follow-up to ensure resolution if cystoscopy was not performed in the interval. Electronically Signed   By: Valentino Saxon MD   On: 11/12/2020 10:39    Disposition: Discharge disposition: 01-Home or Self Care       Discharge Instructions    Urinary leg bag   Complete by: As directed        Follow-up Information    Janith Lima, MD On 11/16/2020.   Specialty: Urology Why: Post-op appointment at 8:00 AM Contact information: [place] [address] [phone]                Signed: Irine Seal 11/13/2020, 10:00 AM

## 2020-11-13 NOTE — Discharge Instructions (Signed)
Indwelling Urinary Catheter Insertion, Care After This sheet gives you information about how to care for yourself after your procedure. Your health care provider may also give you more specific instructions. If you have problems or questions, contact your health care provider. What can I expect after the procedure? After the procedure, it is common to have:  Slight discomfort around your urethra where the catheter enters your body. Follow these instructions at home: General instructions  Keep the drainage bag at or below the level of your bladder. By doing this, your urine can only drain out instead of going back into your body.  Secure the catheter tubing and drainage bag to your leg or thigh to keep it from moving.  Check the catheter tubing regularly to make sure there are no kinks or blockages.  Take showers daily to keep the catheter clean. Do not take a bath.  Do not pull on your catheter.  Disconnect the tubing and drainage bag as little as possible.  Empty the drainage bag every 2-4 hours, or more often if needed. Do not let the bag get completely full.  Wash your hands with soap and water before and after touching the catheter, tubing, or drainage bag.  Do not let the drainage bag or catheter tubing touch the floor.  Drink enough fluids to keep your urine pale yellow, or as told by your health care provider. How to remove the catheter Remove the catheter only if told by your health care provider. Follow instructions from your health care provider about when and how to remove the catheter. For most catheters, you will need to take the following steps: 1. Prepare your supplies. You will need a: ? Syringe. This would be given to you by your health care provider. ? Towel. ? Wastebasket. 2. Empty the drainage bag if needed. 3. Wash your hands with soap and warm water. 4. Remove the tape that secures the catheter to your leg or thigh. 5. Get into a comfortable position, such  as: ? Lying down with your head raised on pillows and your knees pointing to the ceiling. ? Sitting on a chair or the edge of a bed. 6. Place the towel under you to catch any spilled urine. 7. Put the syringe into the balloon port of the catheter. Use a firm push and twist motion to fit the syringe into the balloon port. 8. The water from the balloon will empty into the syringe. 9. Gently pull out the catheter once the balloon is empty. ? If the catheter doesn't slide easily, do not use force. Let your health care provider know that you are not able to remove the catheter. 10. Throw the used catheter and the syringe in the wastebasket. 11. Wipe any spilled urine or water with the towel. 12. Wash your hands with soap and warm water.   Safety Let your health care provider know if:  Your bladder is full, but you are not able to urinate.  You have removed the catheter, but you are not able to urinate after 8 hours. Contact a health care provider if:  Your urine: ? Looks cloudy. ? Has a bad smell. ? Stops flowing into the drainage bag.  Your catheter: ? Gets clogged. ? Starts to leak.  You feel pain or pressure in the bladder area.  You have back pain.  Your drainage bag or tubing looks dirty. Get help right away if:  You have a fever or chills.  You have severe pain in  your back or your lower abdomen.  You have warmth, redness, swelling, or pain in the urethra area.  You notice blood in your urine.  Your catheter gets pulled out. Summary  Do not pull on your catheter or try to remove it.  Keep the drainage bag at or below the level of your bladder, but do not let the drainage bag or catheter tubing touch the floor.  Wash your hands with soap and water before and after touching the catheter, tubing, or drainage bag.  Get help right away if you have a fever, chills, or any other signs of infection. This information is not intended to replace advice given to you by your  health care provider. Make sure you discuss any questions you have with your health care provider. Document Revised: 05/07/2019 Document Reviewed: 07/13/2016 Elsevier Patient Education  2021 [employer].

## 2020-11-13 NOTE — Progress Notes (Signed)
Patient discharged home with wife, discharge instructions given and explained to patient/wife, reviewed and demonstrated foley care for home, they verbalized understanding, patient denies any pain/distress,  Dr. Jeffie Pollock will called in his med at [facility], patient/wife informed. Surgical incision clean/dry/intact., no sign of infection noted.  Patient accompanied home by wife.

## 2020-11-13 NOTE — Progress Notes (Signed)
Patient ID: Daniel Herring, male   DOB: Nov 14, 1960, 60 y.o.   MRN: [id number]  5 Days Post-Op Subjective: Pt continue to have some hematuria with a few clots.  Pelvic US yesterday showed a few clots in the base of the bladder with a large PVR.  He had a foley replaced yesterday retention.  The foley is draining but the urine remains a deep pink.  His Hgb is 8.5 which is reasonably stable.  He had a good BM this am which helped the abdominal discomfort.   He has no other complaints.   Objective: Vital signs in last 24 hours: Temp:  [97.6 F (36.4 C)-98 F (36.7 C)] 97.6 F (36.4 C) (05/29 2023) Pulse Rate:  [71-74] 74 (05/30 0100) Resp:  [18-20] 18 (05/30 0100) BP: (149-165)/(73-89) 149/89 (05/30 0100) SpO2:  [93 %-94 %] 94 % (05/30 0100)  Intake/Output from previous day: 05/29 0701 - 05/30 0700 In: 600 [P.O.:600] Out: 3000 [Urine:3000] Intake/Output this shift: Total I/O In: 240 [P.O.:240] Out: 600 [Urine:600]  Physical Exam:  General: Alert and oriented CV: RRR Lungs: Clear Abdomen: Soft, ND, NT Incisions: C/D/I Ext: NT, No erythema   Lab Results: Recent Labs    11/11/20 0402 11/13/20 0504  HGB 9.0* 8.5*  HCT 28.0* 26.5*   BMET Recent Labs    11/11/20 0402  NA 134*  K 4.1  CL 100  CO2 28  GLUCOSE 197*  BUN 16  CREATININE 1.26*  CALCIUM 8.5*     Studies/Results: US PELVIS LIMITED (TRANSABDOMINAL ONLY)  Result Date: 11/12/2020 CLINICAL DATA:  Gross hematuria, status post LEFT nephrectomy. EXAM: LIMITED ULTRASOUND OF PELVIS TECHNIQUE: Limited transabdominal ultrasound examination of the pelvis was performed. COMPARISON:  September 14, 2020 FINDINGS: The urinary bladder demonstrates heterogeneous material along the posterior aspect. Heterogeneous layering material measures 5.1 x 2.8 x 3.2 cm. No definitive internal blood flow noted. IMPRESSION: There is debris within the bladder which may reflect blood clot. No definitive internal blood flow. Given that similar  debris was present on September 14, 2020, recommend short term follow-up to ensure resolution if cystoscopy was not performed in the interval. Electronically Signed   By: Valentino Saxon MD   On: 11/12/2020 10:39   Procedure:  Foley replaced with 25fr coude with sterile technique.  The balloon was filled with 87ml of sterile water.  The catheter was hand irrigated with return of about 64ml of clot.  Final irrigation was very clear.  Foley placed to drainage bag.    Assessment/Plan: POD # 5 s/p right RAL radical nephrectomy - He has persistent hematuria with a few clots.  I think the foley was not fully in the bladder.  I replaced it with a 84fr coude with return of about 27ml of pink urine.  About 30ml of clot was irrigated out.  The irrigant is now clear and he will be discharged.   Acute Blood loss anemia.   Hgb is reasonably stable and he is not symptomatic.     LOS: 5 days   Irine Seal 11/13/2020, 9:51 AM  Patient ID: Daniel Herring, male   DOB: 07-Jul-1960, 60 y.o.   MRN: [id number] Patient ID: Daniel Herring, male   DOB: Jul 10, 1960, 60 y.o.   MRN: [id number]

## 2020-11-14 ENCOUNTER — Other Ambulatory Visit (HOSPITAL_COMMUNITY): Payer: Self-pay

## 2020-11-16 LAB — URINALYSIS
Bilirubin, Urine: NEGATIVE
Glucose, Ur: NEGATIVE
Ketones, Urine: NEGATIVE
Leukocyte Esterase, Urine: NEGATIVE
Nitrite, Urine: NEGATIVE
Specific Gravity, Urine: 1.025
Urobilinogen, Urine: NORMAL
pH, UA: 5.5 (ref 4.5–8.0)

## 2020-11-18 ENCOUNTER — Other Ambulatory Visit (HOSPITAL_COMMUNITY): Payer: Self-pay

## 2020-11-21 ENCOUNTER — Other Ambulatory Visit (HOSPITAL_COMMUNITY): Payer: Self-pay

## 2021-01-15 ENCOUNTER — Ambulatory Visit: Admit: 2021-01-15 | Discharge: 2021-01-15 | Attending: Urology | Primary: Family Medicine

## 2021-01-15 DIAGNOSIS — C649 Malignant neoplasm of unspecified kidney, except renal pelvis: Secondary | ICD-10-CM

## 2021-01-15 LAB — POCT URINALYSIS DIPSTICK W/O MICROSCOPE (AUTO)
Bilirubin Urine: NEGATIVE
Blood, UA POC: NEGATIVE
Glucose, Ur: NEGATIVE mg/dl
Ketones, Urine: NEGATIVE
Leukocyte Clumps, Urine: NEGATIVE
Nitrite, Urine: NEGATIVE
Protein, Urine: NEGATIVE mg/dl
Specific Gravity, Urine: 1.025 (ref 1.002–1.030)
Urobilinogen, Urine: 0.2 eu/dl (ref 0.0–1.0)
pH, Urine: 5.5 (ref 5.0–9.0)

## 2021-01-15 NOTE — Progress Notes (Addendum)
Barron Schmid MD.    [place] [address]  Dept: [phone]  Dept Fax: [phone]  Loc: [facility] Office Note -     Patient:  Marcus Burnett  Date of Birth: [birth year]    The patient is a 60 y.o. male who presents today for evaluation of the following problems:   Chief Complaint   Patient presents with    New Patient     Renal cell carcinoma     referred/consultation requested by No primary care provider on file.Marland Kitchen    History of Present Illness:    Renal cell carcinoma  Moved from [place]  Hx of left radical nephrectomy 11/08/2020 by Dr Lovena Neighbours  13 cm mass extending into vein-- do not have pathology here but pt states margins are negative        Requested/reviewed records from No primary care provider on file. office and/or outside 17    (Patient's old records have been requested, reviewed and pertinent findings summarized in today's note.)    Procedures Today: N/A      Last several PSA's:  No results found for: PSA    Last total testosterone:  No results found for: TESTOSTERONE    Urinalysis today:  Results for POC orders placed in visit on 01/15/21   POCT Urinalysis No Micro (Auto)   Result Value Ref Range    Glucose, Ur Negative NEGATIVE mg/dl    Bilirubin Urine Negative     Ketones, Urine Negative NEGATIVE    Specific Gravity, Urine 1.025 1.002 - 1.030    Blood, UA POC Negative NEGATIVE    pH, Urine 5.50 5.0 - 9.0    Protein, Urine Negative NEGATIVE mg/dl    Urobilinogen, Urine 0.20 0.0 - 1.0 eu/dl    Nitrite, Urine Negative NEGATIVE    Leukocyte Clumps, Urine Negative NEGATIVE    Color, Urine Yellow [id number]-STRAW    Character, Urine Clear [id number]       Last BUN and creatinine:  Lab Results   Component Value Date    BUN 29 09/25/2020     Lab Results   Component Value Date    CREATININE 1.0 09/25/2020         Imaging Reviewed during this Office Visit:   Maleik Vanderzee ALI Humphrey Rolls, MD  independently reviewed the images and verified the radiology reports from:    Patient was never admitted.    PAST MEDICAL, FAMILY AND SOCIAL HISTORY:  No past medical history on file.  No past surgical history on file.  No family history on file.  Outpatient Medications Marked as Taking for the 01/15/21 encounter (Office Visit) with Kennith Maes, MD   Medication Sig Dispense Refill    TRULICITY 1.5 0000000 SOPN INJECT 0.5 ML (1.'5MG'$  DOSE) INTO THE SKIN EVERY 7 DAYS      metFORMIN (GLUCOPHAGE) 500 MG tablet TAKE 2 TABLETS (1,000 MG TOTAL) BY MOUTH 2 TIMES DAILY WITH MEALS.      glipiZIDE (GLUCOTROL XL) 10 MG extended release tablet TAKE 1 TABLET BY MOUTH EVERY DAY      pioglitazone (ACTOS) 15 MG tablet TAKE 1 TABLET (15 MG TOTAL) BY MOUTH DAILY.      lisinopril (PRINIVIL;ZESTRIL) 40 MG tablet TAKE 1 TABLET BY MOUTH EVERY DAY      pravastatin (PRAVACHOL) 80 MG tablet TAKE 1 TABLET BY MOUTH EVERY DAY  aspirin 81 MG EC tablet Take 81 mg by mouth in the morning.         Patient has no known allergies.  Social History     Tobacco Use   Smoking Status Never   Smokeless Tobacco Never      (If patient a smoker, smoking cessation counseling offered)   Social History     Substance and Sexual Activity   Alcohol Use Never       REVIEW OF SYSTEMS:  Constitutional: negative  Eyes: negative  Respiratory: negative  Cardiovascular: negative  Gastrointestinal: negative  Genitourinary: see HPI  Musculoskeletal: negative  Skin: negative   Neurological: negative  Hematological/Lymphatic: negative  Psychological: negative        Physical Exam:    This a 60 y.o. male  Vitals:    01/15/21 1324   BP: 122/72     Body mass index is 36.16 kg/m??.  Constitutional: Patient in no acute distress;       Assessment and Plan        1. Renal cell carcinoma, unspecified laterality (HCC)               Plan:      RCC treated with radical nephrectomy  CT pending end of the month-- we will call patient with results (selfpay)  Afterwards CT in 6 months in  office         Prescriptions Ordered:  No orders of the defined types were placed in this encounter.     Orders Placed:  Orders Placed This Encounter   Procedures    POCT Urinalysis No Micro (Auto)            Ousman Dise ALI Humphrey Rolls, MD

## 2021-01-16 ENCOUNTER — Encounter

## 2021-01-16 NOTE — Telephone Encounter (Signed)
Patient scheduled for ct abd & pelvis w cont at [facility] on Sept 9 at 10.20 arrive at 950. Npo 4 hrs prior   Order mailed to patient. He voiced understanding. Needs to have creat done a couple day prior

## 2021-01-16 NOTE — Progress Notes (Signed)
Orders given to deb.

## 2021-01-19 ENCOUNTER — Ambulatory Visit: Primary: Family Medicine

## 2021-01-26 LAB — CREATININE CLEARANCE
Creatinine: 2.57 mg/dL — ABNORMAL HIGH (ref 0.80–1.40)
EGFR IF NonAfrican American: 27 mL/min/{1.73_m2} — ABNORMAL LOW (ref >60)

## 2021-01-27 ENCOUNTER — Inpatient Hospital Stay: Admit: 2021-01-27 | Payer: BLUE CROSS/BLUE SHIELD | Primary: Family Medicine

## 2021-01-27 DIAGNOSIS — C649 Malignant neoplasm of unspecified kidney, except renal pelvis: Secondary | ICD-10-CM

## 2021-01-27 LAB — POCT CREATININE: POC CREATININE WHOLE BLOOD: 1.4 mg/dl — ABNORMAL HIGH (ref 0.5–1.2)

## 2021-01-27 MED ORDER — IOPAMIDOL 76 % IV SOLN
76 % | Freq: Once | INTRAVENOUS | Status: AC | PRN
Start: 2021-01-27 — End: 2021-01-27
  Administered 2021-01-27: 15:00:00 80 mL via INTRAVENOUS

## 2021-02-01 ENCOUNTER — Ambulatory Visit: Primary: Family Medicine

## 2021-02-02 ENCOUNTER — Ambulatory Visit: Primary: Family Medicine

## 2021-02-23 ENCOUNTER — Ambulatory Visit: Primary: Family Medicine

## 2021-03-02 ENCOUNTER — Telehealth
Admit: 2021-03-02 | Discharge: 2021-03-16 | Payer: BLUE CROSS/BLUE SHIELD | Attending: Registered Nurse | Primary: Family Medicine

## 2021-03-02 DIAGNOSIS — C642 Malignant neoplasm of left kidney, except renal pelvis: Secondary | ICD-10-CM

## 2021-03-02 NOTE — Progress Notes (Signed)
Marcus Burnett is a 60 y.o. male evaluated via telephone on 03/02/2021.      Consent:  He and/or health care decision maker is aware that that he may receive a bill for this telephone service, depending on his insurance coverage, and has provided verbal consent to proceed: Yes      Documentation:  I communicated with the patient and/or health care decision maker about CT abd/pelvis results, rcc.   Details of this discussion including any medical advice provided:         [address]  [place] [address]  Dept: [phone]  Loc: [phone]    Visit Date: 03/02/2021        HPI:     Marcus Burnett is a 60 y.o. male who presents today for:  Chief Complaint   Patient presents with    Follow-up     Review ct scan results        HPI  Pt seen in follow up for [facility] and to review CT abd/pelvis.      Marcus Burnett has a hx of RCC s/p RAL left radical nephrectomy by Dr. Lovena Neighbours in [facility] 11/08/20.  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  Tumor extended into the renal vein, perirenal and renal sinus fat but not beyond Gerota's fascia.  LN neg.  pT3aN0M0,  Stage III.      Marcus Burnett moved to [facility] following surgery and appt today is to review CT imaging.      Current Outpatient Medications   Medication Sig Dispense Refill    TRULICITY 1.5 0000000 SOPN INJECT 0.5 ML (1.'5MG'$  DOSE) INTO THE SKIN EVERY 7 DAYS      metFORMIN (GLUCOPHAGE) 500 MG tablet TAKE 2 TABLETS (1,000 MG TOTAL) BY MOUTH 2 TIMES DAILY WITH MEALS.      glipiZIDE (GLUCOTROL XL) 10 MG extended release tablet TAKE 1 TABLET BY MOUTH EVERY DAY      pioglitazone (ACTOS) 15 MG tablet TAKE 1 TABLET (15 MG TOTAL) BY MOUTH DAILY.      lisinopril (PRINIVIL;ZESTRIL) 40 MG tablet TAKE 1 TABLET BY MOUTH EVERY DAY      pravastatin (PRAVACHOL) 80 MG tablet TAKE 1 TABLET BY MOUTH EVERY DAY      aspirin 81 MG EC tablet Take 81 mg by mouth in the  morning.       No current facility-administered medications for this visit.       Past Medical History  Marcus Burnett  has no past medical history on file.    Past Surgical History  The patient  has no past surgical history on file.    Family History  This patient's family history is not on file.    Social History  Marcus Burnett  reports that he has never smoked. He has never used smokeless tobacco. He reports that he does not drink alcohol and does not use drugs.      Subjective:      Review of Systems   Constitutional:  Negative for activity change, appetite change, chills, diaphoresis, fatigue, fever and unexpected weight change.   Gastrointestinal:  Negative for abdominal pain, nausea and vomiting.   Genitourinary:  Negative for decreased urine volume, difficulty urinating, dysuria, flank pain, frequency, hematuria and urgency.   Musculoskeletal:  Negative for back pain.     Objective:   There were no vitals taken for  this visit.    Physical Exam  Unable to perform as visit completed via telephone.   POC  No results found for this visit on 03/02/21.      Patients recent PSA values are as follows  No results found for: PSA, PSADIA     Recent BUN/Creatinine:  Lab Results   Component Value Date/Time    BUN 29 09/25/2020 12:00 AM    CREATININE 2.57 01/25/2021 03:15 PM       Radiology  The patient has had a CT abd/pelvis with IV contrast 01/27/21 which I have reviewed along with its accompanying report.  The study demonstrates prior left nephrectomy with mild soft tissue density opacity at the left nephrectomy bed.  R renal stone in mid to upper kidney measuring 5 x 3 mm.  Sub-centimeter low density lesion within the R mid kidney.  Few small lymph nodes within the retroperitoneum in the periaortic region however they are not pathologically enlarged.  2 mm R anterior lung base nodule.     PROCEDURE: CT ABDOMEN PELVIS W IV CONTRAST       CLINICAL INFORMATION: Renal cell carcinoma, unspecified laterality (HCC)        COMPARISON: No  prior study.       TECHNIQUE:  Axial CT images were obtained through the abdomen and pelvis following the intravenous administration of 80 mL Isovue 370 contrast. Coronal and sagittal reformatted images were rendered. All CT scans at this facility use dose modulation,    iterative reconstruction, and/or weight-based dosing when appropriate to reduce radiation dose to as low as reasonably achievable.       FINDINGS:        There is a 2 mm pulmonary nodule partially visualized at the anterior right lung base on axial image 1.       The liver, gallbladder, spleen, pancreas and right adrenal gland appear normal.       There is evidence of prior left nephrectomy. Mild soft tissue opacity at the left nephrectomy bed. Although this could represent possible postsurgical scarring, this remains indeterminate in etiology.       There is a renal stone within the right mid to upper kidney measuring 5 x 3 mm on axial image 27 series 2.       No hydronephrosis or hydroureter is seen. No ureterolithiasis is seen. No bladder stones are identified.       The appendix appears normal.       There is no evidence of bowel obstruction. There is no free air or free fluid. There is no lymphadenopathy.       There are a few small lymph nodes demonstrated within the retroperitoneum in the periaortic region. However, these do not appear pathologically enlarged.       No acute osseous findings are seen.       There is a subcentimeter low-density lesion within the right mid kidney seen on axial image 33 series 2 axial image 17 series 6. Recommend follow-up CT evaluation in 6 months to document stability.           Impression   1. There is a 2 mm pulmonary nodule partially visualized at the anterior right lung base on axial image 1.       2. There is evidence of prior left nephrectomy. Mild soft tissue opacity at the left nephrectomy bed. Although this could represent possible postsurgical scarring, this remains indeterminate in etiology.        3. There is  a nonobstructive renal stone within the right mid to upper kidney measuring 5 x 3 mm on axial image 27 series 2.       4. There is a subcentimeter low-density lesion within the right mid kidney seen on axial image 33 series 2 axial image 17 series 6. Recommend follow-up CT evaluation in 6 months to document stability. This is too small adequately characterize.       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  Given finding of lung nodule and no lung nodule noted on preoperative CT of the chest from 09/2020 recommend proceeding with CT chest now and Akira is agreeable. Office to facilitate scheduling.  Will also check CMP now.      Pt reports hx of kidney stones but at least 10 years since last episode.  No recent flank pain or hematuria.  Reports overall he is doing well.  Discussed stone will be monitored over time and if increasing in size may need to consider elective treatment as he now has a solitary kidney.      Discussed per NCCN guidelines in treatment of stage III kidney cancer with a clear cell histology adjuvant treatment with sutent or Beryle Flock can be considered.  Discussed surveillance without adjuvant therapy is also a valid option.  Discussed if he would like to further discuss pros/cons and risk/benefits of adjuvant therapy we can facilitate referral to medical oncology.  Shaedon declines referral at this time and prefers to continue with surveillance.      CT chest and CMP now--call results.  Will likely need CT abd/pelvis, chest imaging, and CMP in 6 months time with OV to review results but will await results of CT chest and CMP prior to scheduling.      I affirm this is a Patient Initiated Episode with a Patient who has not had a related appointment within my department in the past  7 days or scheduled within the next 24 hours.    Patient identification was verified at the start of the visit: Yes    Total Time: minutes: 21-30 minutes    The visit was conducted pursuant to the emergency declaration under the [place] waiver authority and the [employer] Act.  Patient identification was verified, and a caregiver was present when appropriate. The patient was located in a state where the provider was credentialed to provide care.    Note: not billable if this call serves to triage the patient into an appointment for the relevant concern      Zachery Dakins, APRN - CNP

## 2021-03-16 NOTE — Addendum Note (Signed)
Addended by: Rande Brunt A on: 03/16/2021 01:23 PM     Modules accepted: Orders

## 2021-03-20 LAB — CBC WITH AUTO DIFFERENTIAL
Hematocrit: 37.4 % — AB (ref 41–53)
Hemoglobin: 12.8 g/dL — AB (ref 13.5–17.5)
MCH: 28.1 pg
MCHC: 34.1 g/dL
MCV: 82.2 fL
Platelets: 369 K/??L
RBC: 4.54 10^6/??L
RDW: 15 %
WBC: 7.9 10^3/mL

## 2021-03-20 LAB — LIPID PANEL
CHOLESTEROL/HDL RELATIVE RISK: 3.6 — ABNORMAL LOW (ref 4.0–5.0)
Cholesterol: 174 mg/dL (ref <200)
Direct-LDL / HDL Risk: 2 (ref <3.1)
HDL: 48 mg/dL
LDL Direct: 99 mg/dL
Triglycerides: 303 mg/dL — ABNORMAL HIGH (ref <150)
VLDL: 60 mg/dL — ABNORMAL HIGH (ref <39)

## 2021-03-20 LAB — COMPREHENSIVE METABOLIC PANEL
ALT: 16 IU/L (ref 10–40)
AST: 17 IU/L (ref 15–41)
Albumin/Globulin Ratio: 1.3 — ABNORMAL LOW (ref 1.5–2.5)
Albumin: 3.9 g/dL (ref 3.5–5.0)
Alkaline Phosphatase: 61 IU/L (ref 41–137)
Anion Gap: 8 (ref 4–12)
BUN: 22 mg/dL — ABNORMAL HIGH (ref 7–20)
CO2: 24 mEq/L (ref 21–32)
Calcium: 9.8 mg/dL (ref 8.8–10.5)
Chloride: 105 mEq/L (ref 101–111)
Creatinine Clearance: 52 — ABNORMAL LOW
Creatinine: 1.39 mg/dL — ABNORMAL HIGH (ref 0.60–1.30)
Glucose: 123 mg/dL — ABNORMAL HIGH (ref 70–110)
Potassium: 4.6 mEq/L (ref 3.6–5.0)
Sodium: 137 mEq/L (ref 135–145)
Total Bilirubin: 0.3 mg/dL (ref 0.2–1.0)
Total Protein: 7 g/dL (ref 6.2–8.0)

## 2021-03-20 LAB — CBC WITHOUT DIFFERENTIAL
Hematocrit: 37.4 % — ABNORMAL LOW (ref 40.0–49.0)
Hemoglobin: 12.8 gm/dL — ABNORMAL LOW (ref 13.5–16.5)
MCH: 28.1 PG (ref 27.5–33.0)
MCHC: 34.1 gm/dL (ref 33.0–36.0)
MCV: 82.2 uL (ref 80–97)
Platelets: 369 10*3/uL (ref 150–400)
RBC: 4.54 10*6/uL (ref 4.50–6.00)
RDW: 15 % (ref 12.0–16.0)
WBC: 7.9 10*3/uL (ref 4.4–10.5)

## 2021-03-20 LAB — HEMOGLOBIN A1C
AVERAGE GLUCOSE: 151
Hemoglobin A1C: 6.9 %
Hemoglobin A1C: 6.9 % — ABNORMAL HIGH (ref 4.4–6.4)
eAG: 151 mg/dL

## 2021-03-20 LAB — PSA SCREENING: PSA: 1.02 ng/mL

## 2021-03-21 ENCOUNTER — Inpatient Hospital Stay: Admit: 2021-03-21 | Payer: BLUE CROSS/BLUE SHIELD | Primary: Family Medicine

## 2021-03-21 DIAGNOSIS — C642 Malignant neoplasm of left kidney, except renal pelvis: Secondary | ICD-10-CM

## 2021-03-21 MED ORDER — IOPAMIDOL 76 % IV SOLN
76 % | Freq: Once | INTRAVENOUS | Status: AC | PRN
Start: 2021-03-21 — End: 2021-03-21
  Administered 2021-03-21: 12:00:00 80 mL via INTRAVENOUS

## 2021-03-27 ENCOUNTER — Telehealth

## 2021-03-27 NOTE — Telephone Encounter (Signed)
Please let pt know his CT of the chest showed a few pulmonary nodules.  Reviewed with Dr. Humphrey Rolls and plan to repeat CT of the chest in 6 months.  Will also check CT abd/pelvis and CMP at that time with appt to review results.

## 2021-03-28 ENCOUNTER — Encounter

## 2021-03-28 NOTE — Telephone Encounter (Signed)
Patient advised of ct scan results. He voiced understanding. Patient advised of date and time ct scan was scheduled and follow up appointment. Patient will also have the labs completed at that time. Order sent via mail.

## 2021-03-28 NOTE — Telephone Encounter (Signed)
Patient scheduled for CT ABD/PELV; CT CHEST  at [facility] [place] [facility] on 09/26/21 ARRIVAL OF 910 FOR A 940AM SCAN. NPO 4 HOURS PRIOR.    Order mailed with instructions To the patient

## 2021-03-28 NOTE — Telephone Encounter (Signed)
Patient scheduled

## 2021-04-27 LAB — CREATININE CLEARANCE
Creatinine: 1.35 mg/dL (ref 0.80–1.40)
EGFR IF NonAfrican American: 59 mL/min/{1.73_m2} — ABNORMAL LOW (ref >60)

## 2021-06-15 ENCOUNTER — Encounter

## 2021-06-20 ENCOUNTER — Encounter

## 2021-06-20 ENCOUNTER — Inpatient Hospital Stay: Admit: 2021-06-20 | Payer: BLUE CROSS/BLUE SHIELD | Primary: Family Medicine

## 2021-06-20 ENCOUNTER — Ambulatory Visit: Payer: BLUE CROSS/BLUE SHIELD | Primary: Family Medicine

## 2021-06-20 DIAGNOSIS — R0989 Other specified symptoms and signs involving the circulatory and respiratory systems: Secondary | ICD-10-CM

## 2021-06-22 ENCOUNTER — Encounter

## 2021-07-10 ENCOUNTER — Ambulatory Visit: Payer: BLUE CROSS/BLUE SHIELD | Primary: Family Medicine

## 2021-07-12 ENCOUNTER — Inpatient Hospital Stay: Admit: 2021-07-12 | Payer: BLUE CROSS/BLUE SHIELD | Primary: Family Medicine

## 2021-07-12 DIAGNOSIS — M86271 Subacute osteomyelitis, right ankle and foot: Secondary | ICD-10-CM

## 2021-07-23 ENCOUNTER — Emergency Department: Admit: 2021-07-23 | Payer: BLUE CROSS/BLUE SHIELD | Primary: Family Medicine

## 2021-07-23 ENCOUNTER — Inpatient Hospital Stay
Admit: 2021-07-23 | Discharge: 2021-08-04 | Disposition: A | Discharge status: Home / Self Care | Payer: BLUE CROSS/BLUE SHIELD | Admitting: Internal Medicine

## 2021-07-23 ENCOUNTER — Inpatient Hospital Stay: Admit: 2021-07-24 | Payer: BLUE CROSS/BLUE SHIELD | Primary: Family Medicine

## 2021-07-23 DIAGNOSIS — R4182 Altered mental status, unspecified: Secondary | ICD-10-CM

## 2021-07-23 DIAGNOSIS — C7931 Secondary malignant neoplasm of brain: Secondary | ICD-10-CM

## 2021-07-23 LAB — CBC WITH AUTO DIFFERENTIAL
Basophils Absolute: 0.1 10*3/uL (ref 0.0–0.1)
Basophils: 0.8 %
Eosinophils Absolute: 0.2 10*3/uL (ref 0.0–0.4)
Eosinophils: 2.9 %
Hematocrit: 40.9 % — ABNORMAL LOW (ref 42.0–52.0)
Hemoglobin: 13.6 gm/dl — ABNORMAL LOW (ref 14.0–18.0)
Immature Grans (Abs): 0.02 10*3/uL (ref 0.00–0.07)
Immature Granulocytes: 0.3 %
Lymphocytes Absolute: 1.9 10*3/uL (ref 1.0–4.8)
Lymphocytes: 23.5 %
MCH: 28.9 pg (ref 26.0–33.0)
MCHC: 33.3 gm/dl (ref 32.2–35.5)
MCV: 87 fL (ref 80.0–94.0)
MPV: 10.1 fL (ref 9.4–12.4)
Monocytes Absolute: 0.4 10*3/uL (ref 0.4–1.3)
Monocytes: 4.4 %
Platelets: 294 10*3/uL (ref 130–400)
RBC: 4.7 10*6/uL (ref 4.70–6.10)
RDW-CV: 14.1 % (ref 11.5–14.5)
RDW-SD: 45.5 fL — ABNORMAL HIGH (ref 35.0–45.0)
Seg Neutrophils: 68.1 %
Segs Absolute: 5.4 10*3/uL (ref 1.8–7.7)
WBC: 8 10*3/uL (ref 4.8–10.8)
nRBC: 0 /100 wbc

## 2021-07-23 LAB — URINALYSIS WITH REFLEX TO CULTURE
Bilirubin Urine: NEGATIVE
Blood, Urine: NEGATIVE
Glucose, Ur: NEGATIVE mg/dl
Ketones, Urine: NEGATIVE
Leukocyte Esterase, Urine: NEGATIVE
Nitrite, Urine: NEGATIVE
Protein, UA: NEGATIVE
Specific Gravity, Urine: 1.019 (ref 1.002–1.030)
Urobilinogen, Urine: 0.2 eu/dl (ref 0.0–1.0)
pH, UA: 5.5 (ref 5.0–9.0)

## 2021-07-23 LAB — POCT GLUCOSE: POC Glucose: 137 mg/dl — ABNORMAL HIGH (ref 70–108)

## 2021-07-23 MED ORDER — DEXAMETHASONE SODIUM PHOSPHATE 4 MG/ML IJ SOLN
4 MG/ML | Freq: Four times a day (QID) | INTRAMUSCULAR | Status: AC
Start: 2021-07-23 — End: 2021-07-28
  Administered 2021-07-24 – 2021-07-28 (×19): 4 mg via INTRAVENOUS

## 2021-07-23 MED ORDER — SODIUM CHLORIDE 0.9 % IV SOLN
0.9 | Freq: Two times a day (BID) | INTRAVENOUS | Status: DC
Start: 2021-07-23 — End: 2021-07-28
  Administered 2021-07-24 – 2021-07-28 (×9): 500 mg via INTRAVENOUS

## 2021-07-23 NOTE — Progress Notes (Signed)
Consult form Larry Sierras ( ED charge): patient in [facility] with cognitive changes. Brain mass discovered; wife needs support.     Met patient's wife, Malachy Mood, in ED family lounge. She shared that she is from the area, but they have lived in [place] for the past 20 years and returned last summer. She has had concerns about patient's mental well being for several months and feels this new diagnosis- although troubling- may be the explanation.     Obtained box lunch and beverage for Malachy Mood as she has not eaten since breakfast. Escorted her to ICU waiting area. Made fresh coffee.Obtained ICU info card with [place] code. Suggested that she should plan to go home tonight as she will have some long days ahead of her. Verified her phone number. Let ICU staff know she was in waiting area.   Offered prayer with Malachy Mood and was able to remain with her until support arrived in a friend and their pastor.

## 2021-07-23 NOTE — ED Notes (Signed)
Pt taken to MRI at this time. This RN remains with  Pt.     Levy Sjogren, RN  07/23/21 1900
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
## 2021-07-24 ENCOUNTER — Inpatient Hospital Stay: Admit: 2021-07-24 | Payer: BLUE CROSS/BLUE SHIELD | Primary: Family Medicine

## 2021-07-24 LAB — SODIUM
Sodium: 138 meq/L (ref 135–145)
Sodium: 137 meq/L (ref 135–145)
Sodium: 140 meq/L (ref 135–145)
Sodium: 138 meq/L (ref 135–145)
Sodium: 139 meq/L (ref 135–145)
Sodium: 137 meq/L (ref 135–145)
Sodium: 135 meq/L (ref 135–145)
Sodium: 137 meq/L (ref 135–145)
Sodium: 140 meq/L (ref 135–145)

## 2021-07-24 LAB — CBC WITH AUTO DIFFERENTIAL
Basophils Absolute: 0 10*3/uL (ref 0.0–0.1)
Basophils: 0.4 %
Eosinophils Absolute: 0 10*3/uL (ref 0.0–0.4)
Eosinophils: 0 %
Hematocrit: 40.2 % — ABNORMAL LOW (ref 42.0–52.0)
Hemoglobin: 13.1 gm/dl — ABNORMAL LOW (ref 14.0–18.0)
Immature Grans (Abs): 0.04 10*3/uL (ref 0.00–0.07)
Immature Granulocytes: 0.5 %
Lymphocytes Absolute: 1 10*3/uL (ref 1.0–4.8)
Lymphocytes: 12.1 %
MCH: 28.6 pg (ref 26.0–33.0)
MCHC: 32.6 gm/dl (ref 32.2–35.5)
MCV: 87.8 fL (ref 80.0–94.0)
MPV: 10.3 fL (ref 9.4–12.4)
Monocytes Absolute: 0.2 10*3/uL — ABNORMAL LOW (ref 0.4–1.3)
Monocytes: 2.1 %
Platelets: 272 10*3/uL (ref 130–400)
RBC: 4.58 10*6/uL — ABNORMAL LOW (ref 4.70–6.10)
RDW-CV: 13.9 % (ref 11.5–14.5)
RDW-SD: 44.1 fL (ref 35.0–45.0)
Seg Neutrophils: 84.9 %
Segs Absolute: 6.8 10*3/uL (ref 1.8–7.7)
WBC: 8 10*3/uL (ref 4.8–10.8)
nRBC: 0 /100 wbc

## 2021-07-24 LAB — COMPREHENSIVE METABOLIC PANEL
ALT: 16 U/L (ref 11–66)
ALT: 18 U/L (ref 11–66)
AST: 15 U/L (ref 5–40)
AST: 15 U/L (ref 5–40)
Albumin: 3.8 g/dL (ref 3.5–5.1)
Albumin: 3.9 g/dL (ref 3.5–5.1)
Alkaline Phosphatase: 93 U/L (ref 38–126)
Alkaline Phosphatase: 94 U/L (ref 38–126)
BUN: 14 mg/dL (ref 7–22)
BUN: 15 mg/dL (ref 7–22)
CO2: 18 meq/L — ABNORMAL LOW (ref 23–33)
CO2: 21 meq/L — ABNORMAL LOW (ref 23–33)
Calcium: 9.1 mg/dL (ref 8.5–10.5)
Calcium: 9.4 mg/dL (ref 8.5–10.5)
Chloride: 105 meq/L (ref 98–111)
Chloride: 106 meq/L (ref 98–111)
Creatinine: 1.2 mg/dL (ref 0.4–1.2)
Creatinine: 1.3 mg/dL — ABNORMAL HIGH (ref 0.4–1.2)
Glucose: 171 mg/dL — ABNORMAL HIGH (ref 70–108)
Glucose: 175 mg/dL — ABNORMAL HIGH (ref 70–108)
Potassium: 4.3 meq/L (ref 3.5–5.2)
Potassium: 4.8 meq/L (ref 3.5–5.2)
Sodium: 138 meq/L (ref 135–145)
Sodium: 139 meq/L (ref 135–145)
Total Bilirubin: 0.3 mg/dL (ref 0.3–1.2)
Total Bilirubin: 0.3 mg/dL (ref 0.3–1.2)
Total Protein: 6.4 g/dL (ref 6.1–8.0)
Total Protein: 6.7 g/dL (ref 6.1–8.0)

## 2021-07-24 LAB — URINE DRUG SCREEN
Amphetamine+Methamphetamine Urine Screen: NEGATIVE
Barbiturate Quant, Ur: NEGATIVE
Benzodiazepine Quant, Ur: NEGATIVE
Cannabinoid Quant, Ur: NEGATIVE
Cocaine Metab Quant, Ur: NEGATIVE
Fentanyl: NEGATIVE
Opiates, Urine: NEGATIVE
Oxycodone: NEGATIVE
PCP Quant, Ur: NEGATIVE

## 2021-07-24 LAB — EKG 12-LEAD
Atrial Rate: 61 {beats}/min
P Axis: 32 degrees
P-R Interval: 164 ms
Q-T Interval: 432 ms
QRS Duration: 80 ms
QTc Calculation (Bazett): 434 ms
R Axis: 27 degrees
T Axis: 57 degrees
Ventricular Rate: 61 {beats}/min

## 2021-07-24 LAB — LIPID PANEL
Cholesterol, Total: 179 mg/dL (ref 100–199)
HDL: 41 mg/dL
LDL Calculated: 113 mg/dL
Triglycerides: 123 mg/dL (ref 0–199)

## 2021-07-24 LAB — CORTISOL TOTAL: Cortisol: 8.33 ug/dL

## 2021-07-24 LAB — SEDIMENTATION RATE: Sed Rate: 35 mm/hr — ABNORMAL HIGH (ref 0–10)

## 2021-07-24 LAB — ECHOCARDIOGRAM COMPLETE 2D W DOPPLER W COLOR: Left Ventricular Ejection Fraction: 60

## 2021-07-24 LAB — ANION GAP
Anion Gap: 12 meq/L (ref 8.0–16.0)
Anion Gap: 15 meq/L (ref 8.0–16.0)

## 2021-07-24 LAB — GLOMERULAR FILTRATION RATE, ESTIMATED
Est, Glom Filt Rate: >60 mL/min/{1.73_m2} (ref >60)
Est, Glom Filt Rate: >60 mL/min/{1.73_m2} (ref >60)

## 2021-07-24 LAB — POCT GLUCOSE
POC Glucose: 372 mg/dl — ABNORMAL HIGH (ref 70–108)
POC Glucose: 378 mg/dl — ABNORMAL HIGH (ref 70–108)

## 2021-07-24 LAB — HEMOGLOBIN A1C
AVERAGE GLUCOSE: 147 mg/dL — ABNORMAL HIGH (ref 70–126)
Hemoglobin A1C: 6.9 % — ABNORMAL HIGH (ref 4.4–6.4)

## 2021-07-24 LAB — PROLACTIN: Prolactin: 5.4 ng/ml

## 2021-07-24 LAB — OSMOLALITY: Osmolality Calc: 280.5 mOsmol/kg (ref 275.0–300.0)

## 2021-07-24 LAB — VRE SCREEN BY PCR: Vancomycin Resistant Enterococcus: NEGATIVE

## 2021-07-24 LAB — ACETAMINOPHEN LEVEL: Acetaminophen Level: <5 ug/mL (ref 0.0–20.0)

## 2021-07-24 LAB — SALICYLATE LEVEL: Salicylate, Serum: <0.3 mg/dL — ABNORMAL LOW (ref 2.0–10.0)

## 2021-07-24 LAB — ETHANOL: ETHYL ALCOHOL, SERUM: <0.01 %

## 2021-07-24 LAB — TSH: TSH: 0.92 u[IU]/mL (ref 0.400–4.200)

## 2021-07-24 LAB — T4, FREE: T4 Free: 1.32 ng/dL (ref 0.93–1.76)

## 2021-07-24 LAB — OSMOLALITY, SERUM: Osmolality: 298 mosmol/kg — ABNORMAL HIGH (ref 275–295)

## 2021-07-24 LAB — PROCALCITONIN: Procalcitonin: 0.07 ng/mL (ref 0.01–0.09)

## 2021-07-24 LAB — MAGNESIUM: Magnesium: 1.8 mg/dL (ref 1.6–2.4)

## 2021-07-24 LAB — MRSA BY PCR: MRSA SCREEN RT-PCR: NEGATIVE

## 2021-07-24 MED ORDER — DEXTROSE 10 % IV BOLUS
INTRAVENOUS | Status: AC | PRN
Start: 2021-07-24 — End: ?

## 2021-07-24 MED ORDER — INSULIN LISPRO 100 UNIT/ML IJ SOLN
100 UNIT/ML | Freq: Three times a day (TID) | INTRAMUSCULAR | Status: AC
Start: 2021-07-24 — End: 2021-07-25
  Administered 2021-07-24: 21:00:00 4 [IU] via SUBCUTANEOUS
  Administered 2021-07-25: 17:00:00 2 [IU] via SUBCUTANEOUS

## 2021-07-24 MED ORDER — DEXTROSE 10 % IV SOLN
10 % | INTRAVENOUS | Status: AC | PRN
Start: 2021-07-24 — End: ?

## 2021-07-24 MED ORDER — NORMAL SALINE FLUSH 0.9 % IV SOLN
0.9 % | Freq: Two times a day (BID) | INTRAVENOUS | Status: AC
Start: 2021-07-24 — End: ?
  Administered 2021-07-24 – 2021-08-04 (×15): 10 mL via INTRAVENOUS

## 2021-07-24 MED ORDER — SODIUM CHLORIDE 3 % IV SOLN
3 % | INTRAVENOUS | Status: DC
Start: 2021-07-24 — End: 2021-07-26
  Administered 2021-07-24: 21:00:00 50 mL/h via INTRAVENOUS
  Administered 2021-07-24: 16:00:00 40 mL/h via INTRAVENOUS
  Administered 2021-07-25 – 2021-07-26 (×3): 55 mL/h via INTRAVENOUS

## 2021-07-24 MED ORDER — PRAVASTATIN SODIUM 80 MG PO TABS
80 MG | Freq: Every day | ORAL | Status: AC
Start: 2021-07-24 — End: 2021-08-04
  Administered 2021-07-24 – 2021-08-04 (×10): 80 mg via ORAL

## 2021-07-24 MED ORDER — POLYETHYLENE GLYCOL 3350 17 G PO PACK
17 g | Freq: Every day | ORAL | Status: AC | PRN
Start: 2021-07-24 — End: ?
  Administered 2021-07-29: 18:00:00 17 g via ORAL

## 2021-07-24 MED ORDER — ONDANSETRON 4 MG PO TBDP
4 MG | Freq: Three times a day (TID) | ORAL | Status: AC | PRN
Start: 2021-07-24 — End: ?

## 2021-07-24 MED ORDER — ACETAMINOPHEN 325 MG PO TABS
325 MG | Freq: Four times a day (QID) | ORAL | Status: DC | PRN
Start: 2021-07-24 — End: 2021-07-26

## 2021-07-24 MED ORDER — LISINOPRIL 40 MG PO TABS
40 MG | Freq: Every day | ORAL | Status: AC
Start: 2021-07-24 — End: 2021-08-04
  Administered 2021-07-24 – 2021-08-04 (×10): 40 mg via ORAL

## 2021-07-24 MED ORDER — GADOTERIDOL 279.3 MG/ML IV SOLN
279.3 MG/ML | Freq: Once | INTRAVENOUS | Status: AC | PRN
Start: 2021-07-24 — End: 2021-07-23
  Administered 2021-07-24: 01:00:00 20 mL via INTRAVENOUS

## 2021-07-24 MED ORDER — ONDANSETRON HCL 4 MG/2ML IJ SOLN
4 MG/2ML | Freq: Four times a day (QID) | INTRAMUSCULAR | Status: AC | PRN
Start: 2021-07-24 — End: ?

## 2021-07-24 MED ORDER — GLUCOSE 4 G PO CHEW
4 g | ORAL | Status: AC | PRN
Start: 2021-07-24 — End: ?

## 2021-07-24 MED ORDER — ENOXAPARIN SODIUM 30 MG/0.3ML IJ SOSY
30 | Freq: Two times a day (BID) | INTRAMUSCULAR | Status: DC
Start: 2021-07-24 — End: 2021-07-25
  Administered 2021-07-24 – 2021-07-25 (×3): 30 mg via SUBCUTANEOUS

## 2021-07-24 MED ORDER — IOPAMIDOL 76 % IV SOLN
76 % | Freq: Once | INTRAVENOUS | Status: AC | PRN
Start: 2021-07-24 — End: 2021-07-23
  Administered 2021-07-24: 03:00:00 80 mL via INTRAVENOUS

## 2021-07-24 MED ORDER — ACETAMINOPHEN 650 MG RE SUPP
650 MG | Freq: Four times a day (QID) | RECTAL | Status: DC | PRN
Start: 2021-07-24 — End: 2021-07-26

## 2021-07-24 MED ORDER — SODIUM CHLORIDE 0.9 % IV SOLN
0.9 % | INTRAVENOUS | Status: AC | PRN
Start: 2021-07-24 — End: 2021-07-27

## 2021-07-24 MED ORDER — GLUCAGON HCL RDNA (DIAGNOSTIC) 1 MG IJ SOLR
1 MG | INTRAMUSCULAR | Status: AC | PRN
Start: 2021-07-24 — End: ?

## 2021-07-24 MED ORDER — NORMAL SALINE FLUSH 0.9 % IV SOLN
0.9 % | INTRAVENOUS | Status: AC | PRN
Start: 2021-07-24 — End: ?

## 2021-07-24 MED ORDER — SODIUM CHLORIDE 3 % IV SOLN
3 % | INTRAVENOUS | Status: DC
Start: 2021-07-24 — End: 2021-07-24
  Administered 2021-07-24: 04:00:00 25 mL/h via INTRAVENOUS

## 2021-07-24 MED ORDER — INSULIN LISPRO 100 UNIT/ML IJ SOLN
100 UNIT/ML | Freq: Every evening | INTRAMUSCULAR | Status: AC
Start: 2021-07-24 — End: 2021-07-25

## 2021-07-24 MED FILL — SODIUM CHLORIDE 3 % IV SOLN: 3 % | INTRAVENOUS | Qty: 500

## 2021-07-24 MED FILL — LEVETIRACETAM 500 MG/5ML IV SOLN: 500 MG/5ML | INTRAVENOUS | Qty: 5

## 2021-07-24 MED FILL — PRAVASTATIN SODIUM 80 MG PO TABS: 80 MG | ORAL | Qty: 1

## 2021-07-24 MED FILL — DEXAMETHASONE SODIUM PHOSPHATE 4 MG/ML IJ SOLN: 4 MG/ML | INTRAMUSCULAR | Qty: 1

## 2021-07-24 MED FILL — LISINOPRIL 40 MG PO TABS: 40 MG | ORAL | Qty: 1

## 2021-07-24 MED FILL — ENOXAPARIN SODIUM 30 MG/0.3ML IJ SOSY: 30 MG/0.3ML | INTRAMUSCULAR | Qty: 0.3

## 2021-07-24 MED FILL — HUMALOG 100 UNIT/ML IJ SOLN: 100 UNIT/ML | INTRAMUSCULAR | Qty: 3
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
## 2021-07-24 NOTE — Progress Notes (Signed)
Pharmacist Review and Automatic Dose Adjustment of Prophylactic Enoxaparin    Reviewed reason(s) for admission/hospital problem list    The reviewing pharmacist has made an adjustment to the ordered enoxaparin dose or converted to UFH per the approved [facility] protocol and table as identified below.        Marcus Burnett is a 61 y.o. male.     Recent Labs     07/23/21  1800   CREATININE 1.3*       Estimated Creatinine Clearance: 75 mL/min (A) (based on SCr of 1.3 mg/dL (H)).    Recent Labs     07/23/21  1800   HGB 13.6*   HCT 40.9*   PLT 294     No results for input(s): INR in the last 72 hours.    Height:   Ht Readings from Last 1 Encounters:   07/23/21 5\' 10"  (1.778 m)     Weight:  Wt Readings from Last 1 Encounters:   07/23/21 257 lb 4.4 oz (116.7 kg)               Plan: Based upon the patient's weight and renal function    Ordered: Enoxaparin 40mg  SUBQ Daily    Changed/converted to    New Order: Enoxaparin 30mg  SUBQ BID      Thank you,  Anda Kraft, [place]  07/24/2021, 12:08 AM

## 2021-07-24 NOTE — Consults (Addendum)
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              -Neurological deficit: has slurred speech , difficult findings words, pronator drift in the right.   - Brain edema.  - Brain midline shift.  - brain compression.     PLAN:     -Medical management per patient primary.  -Hypertonic 3% saline with target of sodium of 145-1 50.  -Dexamethasone 4 mg every 6.  -Seizure precaution (Keppra 500 mg twice daily).  -CT chest/abdomen pelvis.  - Radiation and Medical oncology consultations.  - The case was discussed with patient, his wife, ICU team and patient's nurse.   -Neurosurgery to follow.     Decision making:       -Given the current patient current clinical picture, his current neurological exam findings, his MRI radiological features and the wide range of his newly diagnosed brain lesions and and after thoroughly discussion with other treatment team ,  I would recommend for the patient surgical intervention in the form of" " right  sided  craniotomy for maximal safe resection of right  temporal occipital  brain tumor(s)      The recommended  neurosurgical intervention was discussed with patient and his  family (wife) in detail (in front neurosurgery PA, Iona Beard and pateint's nurse, and ICU ANP,  Estill Bamberg ) , and the alternative treatment options, as well as the associated benefits and risks.     -I explained to the patient that the main goal of surgery are:  1-maximum safe resection of his right  temporal occipital  brain tumor(s).  2-obtain tissue for definitive diagnosis  Patient does well with  this upcoming surgery and based on the result of the biopsy, I will plan to take the patient for another surgical intervention in a few days later in the form left-sided craniotomy for maximal resection of left-sided brain tumor .  I elected to start with the right sided lesion because it is the lesion that associated with the midline shift.  We discussed with patient family expected realistic surgical outcomes.      I discussed with the patient and his family the possible complication of surgery including excessive blood loss requiring transfusion, infection that may become meningitis, problem with heart, lung and kidney as a result of general anesthesia such as heart attack, pneumonia, kidney shutdown and stroke. We also discussed the possible complication of the operations in and around the brain such as, death paralysis problems with vision problems with speech, seizures, weakness on one side or the other of the body postoperative meningitis postoperative development of a blood clot that may require another operation, leakage of fluid from the wound that may require another operation, deafness, problems with swallowing and with the ability to close the eye (that may be temporary or  permanent).   I told them based of final results of the pathology he may need another treatment modalities.  I discussed with him specifically the risk of possible intracranial bleed.  All questions and concerns were answered and addressed.  Patient  and his wife stated that he wants to proceed with the recommended surgical treatment option.  The plan now for patient is to schedule him for surgery this Thursday (unless there is no decline in patient medical status).   Needs brain  CT with and without contrast [facility] protocol (with fiducials).  Stop any anticoagulation medications.   Neurosurgery to follow.     Illene Labrador, MD     HISTORY OF PRESENT ILLNESS:  Marcus Burnett is a 61 y.o. male, admitted on :07/23/2021  4:05 PM  This is  61 year old old male with past medical history significant for renal cancer.  Patient presented to the ER last night because of progressive history of cognitive decline and a change in his mental status.  Patient reported visual disturbances.  Patient underwent a brain MRI that showed findings suggestive with brain metastatic disease in the left frontal area and the right temporal occipital area associated with significant edema and midline shift.    ROS:    Review of Systems   Constitutional:  Negative for fever.   HENT:  Negative for congestion.    Eyes:  Positive for visual disturbance.   Respiratory:  Negative for apnea.    Cardiovascular:  Negative for chest pain.   Gastrointestinal:  Negative for abdominal distention.   Genitourinary:  Negative for difficulty urinating.   Musculoskeletal:  Negative for back pain.   Neurological:  Positive for weakness.   Psychiatric/Behavioral:  Positive for confusion.      PROBLEM LIST:  Patient Active Problem List   Diagnosis    Neoplasm of brain causing mass effect on adjacent structures ([place])    Altered mental status       MEDICATIONS:   Prior to Admission medications    Medication Sig Start Date End Date Taking? Authorizing Provider   TRULICITY 1.5 NI/6.2VO SOPN INJECT 0.5 ML (1.5MG  DOSE) INTO THE SKIN EVERY 7 DAYS 11/01/20   Historical Provider, MD   metFORMIN (GLUCOPHAGE) 500 MG tablet TAKE 2 TABLETS (1,000 MG TOTAL) BY MOUTH 2 TIMES DAILY WITH MEALS. 11/06/20   Historical Provider, MD   glipiZIDE (GLUCOTROL XL) 10 MG extended release tablet TAKE 1 TABLET BY MOUTH EVERY DAY 10/10/20   Historical Provider, MD   pioglitazone (ACTOS) 15 MG tablet TAKE 1 TABLET (15 MG TOTAL) BY MOUTH DAILY. 10/09/20   Historical Provider, MD   lisinopril (PRINIVIL;ZESTRIL) 40 MG tablet TAKE 1 TABLET BY MOUTH EVERY DAY 11/19/20   Historical Provider, MD   pravastatin (PRAVACHOL) 80 MG tablet TAKE 1 TABLET BY MOUTH EVERY DAY 10/29/20   Historical Provider, MD   aspirin 81 MG EC tablet Take 81 mg by  mouth in the morning.    Historical Provider, MD       Current Facility-Administered Medications   Medication Dose Route Frequency Provider Last Rate Last Admin    enoxaparin Sodium (LOVENOX) injection 30 mg  30 mg SubCUTAneous BID Christin Charlotta Newton, APRN - CNP   30 mg at 07/24/21 0820    sodium chloride 3 % solution  40 mL/hr IntraVENous Continuous Christin Charlotta Newton, APRN - CNP 40 mL/hr at 07/24/21 1041 40 mL/hr at 07/24/21 1041    levETIRAcetam (KEPPRA) 500 mg in sodium chloride 0.9 % 100 mL  IVPB  500 mg IntraVENous Q12H Hoshimjon J Begmatov, DO 400 mL/hr at 07/24/21 1038 500 mg at 07/24/21 1038    dexamethasone (DECADRON) injection 4 mg  4 mg IntraVENous Q6H Hoshimjon J Begmatov, DO   4 mg at 07/24/21 0820    sodium chloride flush 0.9 % injection 5-40 mL  5-40 mL IntraVENous 2 times per day Christin Charlotta Newton, APRN - CNP   10 mL at 07/24/21 0820    sodium chloride flush 0.9 % injection 5-40 mL  5-40 mL IntraVENous PRN Christin Charlotta Newton, APRN - CNP        0.9 % sodium chloride infusion   IntraVENous PRN Christin Charlotta Newton, APRN - CNP        ondansetron (ZOFRAN-ODT) disintegrating tablet 4 mg  4 mg Oral Q8H PRN Christin Charlotta Newton, APRN - CNP        Or    ondansetron (ZOFRAN) injection 4 mg  4 mg IntraVENous Q6H PRN Christin Charlotta Newton, APRN - CNP        polyethylene glycol (GLYCOLAX) packet 17 g  17 g Oral Daily PRN Christin Charlotta Newton, APRN - CNP        acetaminophen (TYLENOL) tablet 650 mg  650 mg Oral Q6H PRN Christin Charlotta Newton, APRN - CNP        Or    acetaminophen (TYLENOL) suppository 650 mg  650 mg Rectal Q6H PRN Christin Charlotta Newton, APRN - CNP        lisinopril (PRINIVIL;ZESTRIL) tablet 40 mg  40 mg Oral Daily Christin Charlotta Newton, APRN - CNP   40 mg at 07/24/21 0916    pravastatin (PRAVACHOL) tablet 80 mg  80 mg Oral Daily Christin Charlotta Newton, APRN - CNP   80 mg at 07/24/21 0914         sodium chloride flush, 5-40 mL, PRN  sodium chloride, , PRN  ondansetron, 4 mg, Q8H PRN   Or  ondansetron, 4 mg, Q6H PRN  polyethylene glycol, 17 g, Daily PRN  acetaminophen, 650 mg, Q6H PRN   Or  acetaminophen, 650 mg, Q6H PRN         sodium chloride 40 mL/hr (07/24/21 1041)    sodium chloride           ALLERGIES:   Patient has no known allergies.    PAST MEDICAL  HISTORY:    has no past medical history on file.     PAST SURGICAL  HISTORY:    has no past surgical history on file.    SOCIAL HISTORY:   Social History     Tobacco Use    Smoking status: Never    Smokeless tobacco: Never   Substance Use Topics    Alcohol use: Never       FAMILY HISTORY:  History reviewed. No pertinent family history.    LABS  CBC:   Lab Results   Component Value Date/Time    WBC 8.0 07/24/2021 04:12 AM    RBC 4.58 07/24/2021 04:12 AM    HGB 13.1 07/24/2021 04:12 AM    HCT 40.2 07/24/2021 04:12 AM    MCV 87.8 07/24/2021 04:12 AM    MCH 28.6 07/24/2021 04:12 AM    MCHC 32.6 07/24/2021 04:12 AM    RDW 15.0 03/20/2021 08:21 AM    PLT 272 07/24/2021 04:12 AM    MPV 10.3 07/24/2021 04:12 AM     CMP:    Lab Results   Component Value Date/Time  NA 139 07/24/2021 08:55 AM    K 4.8 07/24/2021 04:12 AM    CL 106 07/24/2021 04:12 AM    CO2 18 07/24/2021 04:12 AM    BUN 14 07/24/2021 04:12 AM    CREATININE 1.2 07/24/2021 04:12 AM    AGRATIO 1.3 03/20/2021 08:21 AM    LABGLOM >60 07/24/2021 04:12 AM    GLUCOSE 175 07/24/2021 04:12 AM    GLUCOSE 123 03/20/2021 08:21 AM    PROT 6.4 07/24/2021 04:12 AM    LABALBU 3.9 07/24/2021 04:12 AM    CALCIUM 9.4 07/24/2021 04:12 AM    BILITOT 0.3 07/24/2021 04:12 AM    ALKPHOS 93 07/24/2021 04:12 AM    AST 15 07/24/2021 04:12 AM    ALT 16 07/24/2021 04:12 AM     PT/INR:  No results found for: PROTIME, INR  PTT:  No results found for: APTT, PTT[APTT}    RADIOLOGY:  Pertinent images have been reviewed.  Brain MRI showed:    1. Lobulated intra-axial 3 masses and associated blood products consistent with  hemorrhage. This is most consistent with metastatic disease.   2. Surrounding vasogenic edema with slight midline shift to the left.       EXAMINATION:  Patient awake alert and oriented x3  Slightly confused  Has difficulty to find words  GCS: 15/15   Pupils: equal and reactive   FCx4 with good strength through out (as a pronator drift in the left).  Sensory: grossly intact  Reflexes: 2+ through out  Planter reflex: Down response     **Time spent was at least min 35 of critical time evaluating patient, discussion with staff, planning for treatment and evaluation. The time was spent examining patient face to face, reviewing the images personally, reviewing the chart, perform high complexity decision making and speaking with the nursing staff regarding recommendations. There was a high probability of clinically significant life-threatening deterioration of the patient's condition requiring my urgent intervention. Time may be discontiguous. Time does not include procedures.     Illene Labrador, MD,MD  Electronically signed 07/24/2021

## 2021-07-24 NOTE — Progress Notes (Signed)
Neurosurgery Interim Progress Note    Patient:  Marcus Burnett      Unit/Bed:4B-01/001-A    Date of Birth: [birth year]    MRN: [id number]     Acct: [id number]     Admit date: 07/23/2021    Chief Complaint   Patient presents with    Depression    Altered Mental Status       Patient Seen, Chart, Physician notes, Labs, Radiology studies reviewed.    Patient is seen and evaluated on 4B room 1 with evaluation exam findings reviewed and discussed with Dr. Gardenia Phlegm and with nursing.  Patient is resting comfortably in bed with the head of the bed mildly elevated.  Services consulted due to findings on imaging and admission significant for lobulated in her axial masses suspicious for metastatic disease with MRI confirmation of right occipital lobe right parietal lobe and left frontal lobe masses with associated vasogenic edema contributing to slight midline shift to the left.  Presents with a flat affect.  On exam patient presents with some left facial droop and confusion.    Patient recommendation and treatment plan from a neurosurgical perspective at this time:    A detailed neurosurgical note to follow    Electronically signed by Brayton El, PA-C on 07/24/2021 at 7:32 AM

## 2021-07-24 NOTE — Progress Notes (Signed)
Echo done bedside

## 2021-07-24 NOTE — Significant Event (Signed)
Extensive conversation with patient at the bedside and his wife.  He explained the recent events and how he ended up in the hospital.  He did extensively that he does not want chemotherapy or radiation.  At this time he is still a full code.  He would like to speak with neurosurgery about possibility of tumor resection.  He understands that he likely has metastatic disease as there are lesions noted in his liver and on his spine.  Awaiting additional imaging.  His wife understands his wishes and supports.  I did speak with him about PICC line due to frequent sodium labs and he is agreeable.  Much emotional support was provided to patient and his wife.  His family is on their way from [place].  He is very tearful.  He did express that he has lived his life and if it is time to go that he is ready.  All questions were answered.  I explained I would return later in the day with Dr. Jonne Ply neurosurgeon.    Electronically signed by Raiford Noble. Neomia Dear, APRN - CNP on 07/24/2021 at 9:57 AM

## 2021-07-24 NOTE — Plan of Care (Signed)
Problem: Discharge Planning  Goal: Discharge to home or other facility with appropriate resources  07/24/2021 0805 by Kerby Nora, RN  Outcome: Progressing  Flowsheets (Taken 07/24/2021 0730)  Discharge to home or other facility with appropriate resources:   Identify barriers to discharge with patient and caregiver   Arrange for needed discharge resources and transportation as appropriate   Identify discharge learning needs (meds, wound care, etc)  07/24/2021 0546 by Cephus Shelling, RN  Outcome: Progressing  Flowsheets (Taken 07/24/2021 0400)  Discharge to home or other facility with appropriate resources:   Identify barriers to discharge with patient and caregiver   Arrange for needed discharge resources and transportation as appropriate   Identify discharge learning needs (meds, wound care, etc)   Refer to discharge planning if patient needs post-hospital services based on physician order or complex needs related to functional status, cognitive ability or social support system     Problem: Safety - Adult  Goal: Free from fall injury  07/24/2021 0805 by Kerby Nora, RN  Outcome: Progressing  Flowsheets (Taken 07/24/2021 0805)  Free From Fall Injury:   Instruct family/caregiver on patient safety   Based on caregiver fall risk screen, instruct family/caregiver to ask for assistance with transferring infant if caregiver noted to have fall risk factors  07/24/2021 0546 by Cephus Shelling, RN  Outcome: Progressing     Problem: Skin/Tissue Integrity  Goal: Absence of new skin breakdown  Description: 1.  Monitor for areas of redness and/or skin breakdown  2.  Assess vascular access sites hourly  3.  Every 4-6 hours minimum:  Change oxygen saturation probe site  4.  Every 4-6 hours:  If on nasal continuous positive airway pressure, respiratory therapy assess nares and determine need for appliance change or resting period.  07/24/2021 0805 by Kerby Nora, RN  Outcome: Progressing  Note: No s/s of skin breakdown or pressure  injury. Patient continues to turn himself. Will continue to monitor   07/24/2021 0546 by Cephus Shelling, RN  Outcome: Progressing     Problem: ABCDS Injury Assessment  Goal: Absence of physical injury  07/24/2021 0805 by Kerby Nora, RN  Outcome: Progressing  Flowsheets (Taken 07/24/2021 0805)  Absence of Physical Injury: Implement safety measures based on patient assessment  07/24/2021 0546 by Cephus Shelling, RN  Outcome: Progressing     Problem: Anxiety  Goal: Will report anxiety at manageable levels  Description: INTERVENTIONS:  1. Administer medication as ordered  2. Teach and rehearse alternative coping skills  3. Provide emotional support with 1:1 interaction with staff  07/24/2021 0805 by Kerby Nora, RN  Outcome: Progressing  Flowsheets (Taken 07/24/2021 0730)  Will report anxiety at manageable levels:   Administer medication as ordered   Teach and rehearse alternative coping skills   Provide emotional support with 1:1 interaction with staff  07/24/2021 0546 by Cephus Shelling, RN  Outcome: Progressing  Flowsheets (Taken 07/24/2021 0400)  Will report anxiety at manageable levels: Provide emotional support with 1:1 interaction with staff

## 2021-07-24 NOTE — Plan of Care (Signed)
Problem: Discharge Planning  Goal: Discharge to home or other facility with appropriate resources  07/24/2021 0805 by Kerby Nora, RN  Outcome: Progressing  Flowsheets (Taken 07/24/2021 0730)  Discharge to home or other facility with appropriate resources:   Identify barriers to discharge with patient and caregiver   Arrange for needed discharge resources and transportation as appropriate   Identify discharge learning needs (meds, wound care, etc)  07/24/2021 0546 by Cephus Shelling, RN  Outcome: Progressing  Flowsheets (Taken 07/24/2021 0400)  Discharge to home or other facility with appropriate resources:   Identify barriers to discharge with patient and caregiver   Arrange for needed discharge resources and transportation as appropriate   Identify discharge learning needs (meds, wound care, etc)   Refer to discharge planning if patient needs post-hospital services based on physician order or complex needs related to functional status, cognitive ability or social support system     Problem: Safety - Adult  Goal: Free from fall injury  07/24/2021 0950 by Kerby Nora, RN  Outcome: Progressing  Flowsheets (Taken 07/24/2021 0805)  Free From Fall Injury:   Instruct family/caregiver on patient safety   Based on caregiver fall risk screen, instruct family/caregiver to ask for assistance with transferring infant if caregiver noted to have fall risk factors  07/24/2021 0805 by Kerby Nora, RN  Outcome: Progressing  Flowsheets (Taken 07/24/2021 0805)  Free From Fall Injury:   Instruct family/caregiver on patient safety   Based on caregiver fall risk screen, instruct family/caregiver to ask for assistance with transferring infant if caregiver noted to have fall risk factors  07/24/2021 0546 by Cephus Shelling, RN  Outcome: Progressing     Problem: Skin/Tissue Integrity  Goal: Absence of new skin breakdown  Description: 1.  Monitor for areas of redness and/or skin breakdown  2.  Assess vascular access sites hourly  3.  Every 4-6 hours  minimum:  Change oxygen saturation probe site  4.  Every 4-6 hours:  If on nasal continuous positive airway pressure, respiratory therapy assess nares and determine need for appliance change or resting period.  07/24/2021 0950 by Kerby Nora, RN  Outcome: Progressing  07/24/2021 0805 by Kerby Nora, RN  Outcome: Progressing  Note: No s/s of skin breakdown or pressure injury. Patient continues to turn himself. Will continue to monitor   07/24/2021 0546 by Cephus Shelling, RN  Outcome: Progressing     Problem: ABCDS Injury Assessment  Goal: Absence of physical injury  07/24/2021 0950 by Kerby Nora, RN  Outcome: Progressing  Flowsheets (Taken 07/24/2021 0805)  Absence of Physical Injury: Implement safety measures based on patient assessment  07/24/2021 0805 by Kerby Nora, RN  Outcome: Progressing  Flowsheets (Taken 07/24/2021 0805)  Absence of Physical Injury: Implement safety measures based on patient assessment  07/24/2021 0546 by Cephus Shelling, RN  Outcome: Progressing     Problem: Anxiety  Goal: Will report anxiety at manageable levels  Description: INTERVENTIONS:  1. Administer medication as ordered  2. Teach and rehearse alternative coping skills  3. Provide emotional support with 1:1 interaction with staff  07/24/2021 0950 by Kerby Nora, RN  Outcome: Progressing  Flowsheets (Taken 07/24/2021 0730)  Will report anxiety at manageable levels:   Administer medication as ordered   Teach and rehearse alternative coping skills   Provide emotional support with 1:1 interaction with staff  07/24/2021 0805 by Kerby Nora, RN  Outcome: Progressing  Flowsheets (Taken 07/24/2021 0730)  Will report anxiety at manageable levels:   Administer medication as ordered  Teach and rehearse alternative coping skills   Provide emotional support with 1:1 interaction with staff  07/24/2021 0546 by Cephus Shelling, RN  Outcome: Progressing  Flowsheets (Taken 07/24/2021 0400)  Will report anxiety at manageable levels: Provide emotional support  with 1:1 interaction with staff
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
## 2021-07-24 NOTE — Plan of Care (Signed)
Problem: Discharge Planning  Goal: Discharge to home or other facility with appropriate resources  Outcome: Progressing  Flowsheets (Taken 07/24/2021 0400)  Discharge to home or other facility with appropriate resources:   Identify barriers to discharge with patient and caregiver   Arrange for needed discharge resources and transportation as appropriate   Identify discharge learning needs (meds, wound care, etc)   Refer to discharge planning if patient needs post-hospital services based on physician order or complex needs related to functional status, cognitive ability or social support system     Problem: Safety - Adult  Goal: Free from fall injury  Outcome: Progressing     Problem: Skin/Tissue Integrity  Goal: Absence of new skin breakdown  Description: 1.  Monitor for areas of redness and/or skin breakdown  2.  Assess vascular access sites hourly  3.  Every 4-6 hours minimum:  Change oxygen saturation probe site  4.  Every 4-6 hours:  If on nasal continuous positive airway pressure, respiratory therapy assess nares and determine need for appliance change or resting period.  Outcome: Progressing     Problem: ABCDS Injury Assessment  Goal: Absence of physical injury  Outcome: Progressing     Problem: Anxiety  Goal: Will report anxiety at manageable levels  Description: INTERVENTIONS:  1. Administer medication as ordered  2. Teach and rehearse alternative coping skills  3. Provide emotional support with 1:1 interaction with staff  Outcome: Progressing  Flowsheets (Taken 07/24/2021 0400)  Will report anxiety at manageable levels: Provide emotional support with 1:1 interaction with staff

## 2021-07-24 NOTE — Progress Notes (Signed)
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]  Altered mental status, unspecified altered mental status type [R41.82]      Attending Physician- Luan Moore, MD  Ordering Physician- Richardson Landry, APRN-CNP  Indication for Insertion: Poor Vascular Access    Catheter Insertion Date- 07/24/2021   Lot Number- [id number]  Gauge-4  Lumen-dual    Insertion Site- RUA Basilic  Vein Diameter- 7.61 cm  Catheter Length- 46 cm  Internal Length- 46 cm  Exposed Catheter Length- 0cm   Upper Arm Circumference- 36cm  Tip Confirmation System Bundle met- yes  If X-ray required, Tip Location- n/a  Radiologist- n/a    PICC insertion successful- yes  Ultrasound- yes    Okay To Use PICC- yes    Electronically signed by Fransico Setters, RN, RN on 07/24/2021 at 2:50 PM
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
## 2021-07-25 ENCOUNTER — Inpatient Hospital Stay: Admit: 2021-07-25 | Payer: BLUE CROSS/BLUE SHIELD | Primary: Family Medicine

## 2021-07-25 LAB — CBC WITH AUTO DIFFERENTIAL
Basophils Absolute: 0 10*3/uL (ref 0.0–0.1)
Basophils: 0.1 %
Eosinophils Absolute: 0 10*3/uL (ref 0.0–0.4)
Eosinophils: 0 %
Hematocrit: 35.8 % — ABNORMAL LOW (ref 42.0–52.0)
Hemoglobin: 12.3 gm/dl — ABNORMAL LOW (ref 14.0–18.0)
Immature Grans (Abs): 0.06 10*3/uL (ref 0.00–0.07)
Immature Granulocytes: 0.5 %
Lymphocytes Absolute: 0.8 10*3/uL — ABNORMAL LOW (ref 1.0–4.8)
Lymphocytes: 6.7 %
MCH: 28.9 pg (ref 26.0–33.0)
MCHC: 34.4 gm/dl (ref 32.2–35.5)
MCV: 84.2 fL (ref 80.0–94.0)
MPV: 10.4 fL (ref 9.4–12.4)
Monocytes Absolute: 0.3 10*3/uL — ABNORMAL LOW (ref 0.4–1.3)
Monocytes: 2.8 %
Platelets: 278 10*3/uL (ref 130–400)
RBC: 4.25 10*6/uL — ABNORMAL LOW (ref 4.70–6.10)
RDW-CV: 14 % (ref 11.5–14.5)
RDW-SD: 42.6 fL (ref 35.0–45.0)
Seg Neutrophils: 89.9 %
Segs Absolute: 11 10*3/uL — ABNORMAL HIGH (ref 1.8–7.7)
WBC: 12.2 10*3/uL — ABNORMAL HIGH (ref 4.8–10.8)
nRBC: 0 /100 wbc

## 2021-07-25 LAB — TEG PLATELET MAPPING
% Inhibition AA: 1.2 %
% Inhibition ADP: 5.6 %
AA % Aggregation: 98.8 %
AA Agonist Max Amplitude: 66.2 mm (ref 51.0–71.0)
ADP % Aggregation: 94.4 %
ADP Agonist Max Amplitude: 64 mm (ref 45.0–69.0)
Inhibited Platelet Max Amplitude: 17 mm (ref 2.0–19.0)
MAXIMUM AMPLITUDE: 66.8 mm (ref 53.0–68.0)

## 2021-07-25 LAB — BASIC METABOLIC PANEL W/ REFLEX TO MG FOR LOW K
BUN: 18 mg/dL (ref 7–22)
CO2: 19 meq/L — ABNORMAL LOW (ref 23–33)
Calcium: 9.2 mg/dL (ref 8.5–10.5)
Chloride: 109 meq/L (ref 98–111)
Creatinine: 1.2 mg/dL (ref 0.4–1.2)
Glucose: 187 mg/dL — ABNORMAL HIGH (ref 70–108)
Potassium reflex Magnesium: 4.2 meq/L (ref 3.5–5.2)
Sodium: 140 meq/L (ref 135–145)

## 2021-07-25 LAB — GLOMERULAR FILTRATION RATE, ESTIMATED: Est, Glom Filt Rate: >60 mL/min/{1.73_m2} (ref >60)

## 2021-07-25 LAB — SODIUM
Sodium: 137 meq/L (ref 135–145)
Sodium: 140 meq/L (ref 135–145)
Sodium: 139 meq/L (ref 135–145)
Sodium: 138 meq/L (ref 135–145)
Sodium: 140 meq/L (ref 135–145)
Sodium: 139 meq/L (ref 135–145)
Sodium: 138 meq/L (ref 135–145)
Sodium: 139 meq/L (ref 135–145)

## 2021-07-25 LAB — ANION GAP: Anion Gap: 12 meq/L (ref 8.0–16.0)

## 2021-07-25 LAB — POCT GLUCOSE
POC Glucose: 245 mg/dl — ABNORMAL HIGH (ref 70–108)
POC Glucose: 142 mg/dl — ABNORMAL HIGH (ref 70–108)
POC Glucose: 254 mg/dl — ABNORMAL HIGH (ref 70–108)
POC Glucose: 312 mg/dl — ABNORMAL HIGH (ref 70–108)

## 2021-07-25 LAB — MAGNESIUM: Magnesium: 1.9 mg/dL (ref 1.6–2.4)

## 2021-07-25 LAB — CULTURE, AEROBIC

## 2021-07-25 LAB — CORTISOL TOTAL: Cortisol: 0.32 ug/dL

## 2021-07-25 MED ORDER — COSYNTROPIN 0.25 MG IJ SOLR
0.25 MG | Freq: Once | INTRAMUSCULAR | Status: AC
Start: 2021-07-25 — End: 2021-07-29

## 2021-07-25 MED ORDER — IOPAMIDOL 76 % IV SOLN
76 % | Freq: Once | INTRAVENOUS | Status: AC | PRN
Start: 2021-07-25 — End: 2021-07-25
  Administered 2021-07-25: 19:00:00 80 mL via INTRAVENOUS

## 2021-07-25 MED ORDER — COSYNTROPIN 0.25 MG IJ SOLR
0.25 MG | Freq: Once | INTRAMUSCULAR | Status: AC
Start: 2021-07-25 — End: 2021-07-25
  Administered 2021-07-25: 16:00:00 250 ug via INTRAVENOUS

## 2021-07-25 MED ORDER — GADOTERIDOL 279.3 MG/ML IV SOLN
279.3 MG/ML | Freq: Once | INTRAVENOUS | Status: AC | PRN
Start: 2021-07-25 — End: 2021-07-24
  Administered 2021-07-25: 20 mL via INTRAVENOUS

## 2021-07-25 MED ORDER — INSULIN LISPRO 100 UNIT/ML IJ SOLN
100 UNIT/ML | Freq: Three times a day (TID) | INTRAMUSCULAR | Status: AC
Start: 2021-07-25 — End: 2021-08-03
  Administered 2021-07-25: 22:00:00 6 [IU] via SUBCUTANEOUS
  Administered 2021-07-26 – 2021-07-27 (×2): 2 [IU] via SUBCUTANEOUS
  Administered 2021-07-27: 17:00:00 4 [IU] via SUBCUTANEOUS
  Administered 2021-07-28 – 2021-08-03 (×9): 2 [IU] via SUBCUTANEOUS

## 2021-07-25 MED ORDER — INSULIN LISPRO 100 UNIT/ML IJ SOLN
100 UNIT/ML | Freq: Every evening | INTRAMUSCULAR | Status: AC
Start: 2021-07-25 — End: 2021-08-04
  Administered 2021-08-04: 02:00:00 4 [IU] via SUBCUTANEOUS

## 2021-07-25 MED FILL — DEXAMETHASONE SODIUM PHOSPHATE 4 MG/ML IJ SOLN: 4 MG/ML | INTRAMUSCULAR | Qty: 1

## 2021-07-25 MED FILL — LISINOPRIL 40 MG PO TABS: 40 MG | ORAL | Qty: 1

## 2021-07-25 MED FILL — LEVETIRACETAM 500 MG/5ML IV SOLN: 500 MG/5ML | INTRAVENOUS | Qty: 5

## 2021-07-25 MED FILL — HUMALOG 100 UNIT/ML IJ SOLN: 100 UNIT/ML | INTRAMUSCULAR | Qty: 6

## 2021-07-25 MED FILL — ENOXAPARIN SODIUM 30 MG/0.3ML IJ SOSY: 30 MG/0.3ML | INTRAMUSCULAR | Qty: 0.3

## 2021-07-25 MED FILL — SODIUM CHLORIDE 3 % IV SOLN: 3 % | INTRAVENOUS | Qty: 500

## 2021-07-25 MED FILL — COSYNTROPIN 0.25 MG IJ SOLR: 0.25 MG | INTRAMUSCULAR | Qty: 250

## 2021-07-25 MED FILL — CORTROSYN 0.25 MG IJ SOLR: 0.25 MG | INTRAMUSCULAR | Qty: 250

## 2021-07-25 MED FILL — PRAVASTATIN SODIUM 80 MG PO TABS: 80 MG | ORAL | Qty: 1

## 2021-07-25 MED FILL — HUMALOG 100 UNIT/ML IJ SOLN: 100 UNIT/ML | INTRAMUSCULAR | Qty: 2
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
## 2021-07-25 NOTE — Plan of Care (Signed)
Problem: Discharge Planning  Goal: Discharge to home or other facility with appropriate resources  Outcome: Progressing  Flowsheets (Taken 07/24/2021 0730 by Kerby Nora, RN)  Discharge to home or other facility with appropriate resources:   Identify barriers to discharge with patient and caregiver   Arrange for needed discharge resources and transportation as appropriate   Identify discharge learning needs (meds, wound care, etc)     Problem: Safety - Adult  Goal: Free from fall injury  Outcome: Progressing  Flowsheets (Taken 07/24/2021 0805 by Kerby Nora, RN)  Free From Fall Injury:   Instruct family/caregiver on patient safety   Based on caregiver fall risk screen, instruct family/caregiver to ask for assistance with transferring infant if caregiver noted to have fall risk factors     Problem: Skin/Tissue Integrity  Goal: Absence of new skin breakdown  Description: 1.  Monitor for areas of redness and/or skin breakdown  2.  Assess vascular access sites hourly  3.  Every 4-6 hours minimum:  Change oxygen saturation probe site  4.  Every 4-6 hours:  If on nasal continuous positive airway pressure, respiratory therapy assess nares and determine need for appliance change or resting period.  Outcome: Progressing     Problem: ABCDS Injury Assessment  Goal: Absence of physical injury  Outcome: Progressing  Flowsheets (Taken 07/24/2021 0805 by Kerby Nora, RN)  Absence of Physical Injury: Implement safety measures based on patient assessment     Problem: Anxiety  Goal: Will report anxiety at manageable levels  Description: INTERVENTIONS:  1. Administer medication as ordered  2. Teach and rehearse alternative coping skills  3. Provide emotional support with 1:1 interaction with staff  Outcome: Progressing  Flowsheets (Taken 07/24/2021 0730 by Kerby Nora, RN)  Will report anxiety at manageable levels:   Administer medication as ordered   Teach and rehearse alternative coping skills   Provide emotional support with  1:1 interaction with staff
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
## 2021-07-25 NOTE — Care Coordination-Inpatient (Signed)
07/25/21, 3:38 PM EST    DISCHARGE ON GOING EVALUATION    [facility] day: 2  Location: 4B-01/001-A Reason for admit: Neoplasm of brain causing mass effect on adjacent structures ([place]) [D49.6, G93.5]  Altered mental status, unspecified altered mental status type [R41.82]   Procedure:   2/6 CT Head: A left frontal lobe mass and a left parietotemporal lobe mass are noted as described above. There is midline shift to the left at the level of the septum pellucidum related to subfalcine herniation. Differential diagnoses would include metastatic disease, glioblastoma multiforme, abscess amongst other possibilities  2/6 CT Chest: Bilateral pulmonary nodules are noted. 1 of the nodules which is located in the left lower lobe appears larger compared to the prior study and measures 5-6 mm. Previously it measured 3 mm. Although this could be   due at least in part to differences in slice selection, neoplastic disease is questioned. Short-term follow-up is advised  2/6 CT Abd/pelvis: A few subcentimeter enhancing lesions are noted within the liver, these are better seen on the CT chest study performed on the same day. Neoplastic disease will need to be excluded. Follow-up nonurgent liver   MRI, without and with contrast, is advised; A subcentimeter sclerotic lesion is noted within the L2 vertebral body   which is new since the prior study. This is also suspicious for neoplastic disease.  2/6 CXR: No acute process  2/7 MRI Brain: Lobulated intra-axial 3 masses and associated blood products consistent with hemorrhage. This is most consistent with metastatic disease; Surrounding vasogenic edema with slight midline shift to the left; Mild severity chronic small vessel ischemic changes.  2/7 MRI Abdomen: No abnormal enhancing hepatic mass is seen; Status post left nephrectomy. No residual or recurrent disease in the surgical bed  2/7 Echo with EF 60%  2/8 MRI Lumbar spine: 8 mm indeterminate lesion in L2. This  could represent a small metastasis. Other etiologies are not excluded; No evidence of tumor within the spinal canal; Moderate severity right and mild left foraminal stenosis at the L5-S1 level due to degenerative changes  2/8 CT Head: Preoperative planning for the hemorrhagic masses in the left frontal lobe, right temporal lobe and right occipital lobe    Barriers to Discharge: Found to have lesions on liver and spine, likely metastatic disease. Patient does not want chemo or radiation, but would like to have brain tumor resection. Plan for surgery tomorrow morning with Dr. Jonne Ply for right craniotomy. Remains on 3% saline drip with Q2H Na+ levels.     Afebrile. NSR. On room air. Ox4. Delayed responses. Follows commands. NIH 2: facial palsy, best language. SLP/PT/OT. Seizure precautions, n/v checks, SCDs. 3% saline @ 55 ml/hr, IV decadron 4 mg Q6H, SSI, IV keppra, lisinopril, pravastatin. Received 250 mcg IV cortrosyn x1 today.     PCP: [name], MD  Readmission Risk Score: 11.2%  Patient Goals/Plan/Treatment Preferences: From home w/wife. Plan pending clinical course.
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
## 2021-07-25 NOTE — Progress Notes (Signed)
Neurosurgery Progress Note    Patient:  Marcus Burnett      Unit/Bed:4B-01/001-A    Date of Birth: 10-10-1960    MRN: [id number]     Acct: [id number]     Admit date: 07/23/2021    Chief Complaint   Patient presents with    Depression    Altered Mental Status       Patient Seen, Chart, Physician notes, Labs, Radiology studies reviewed.    Subjective: Patient is seen and evaluated on 4B with evaluation exam findings reviewed and discussed with Dr. Gardenia Phlegm and with nursing.  Patient is resting comfortably this morning with pain well to moderately controlled.        Past, Family, Social History unchanged from admission.    Diet:  ADULT DIET; Regular; 4 carb choices (60 gm/meal); Low Fat/Low Chol/High Fiber/2 gm Na    Medications:  Scheduled Meds:   cosyntropin  250 mcg IntraVENous Once    enoxaparin  30 mg SubCUTAneous BID    insulin lispro  0-4 Units SubCUTAneous TID WC    insulin lispro  0-4 Units SubCUTAneous Nightly    levETIRAcetam  500 mg IntraVENous Q12H    dexamethasone  4 mg IntraVENous Q6H    sodium chloride flush  5-40 mL IntraVENous 2 times per day    lisinopril  40 mg Oral Daily    pravastatin  80 mg Oral Daily     Continuous Infusions:   sodium chloride 55 mL/hr (07/25/21 0606)    dextrose      sodium chloride       PRN Meds:glucose, dextrose bolus **OR** dextrose bolus, glucagon (rDNA), dextrose, sodium chloride flush, sodium chloride, ondansetron **OR** ondansetron, polyethylene glycol, acetaminophen **OR** acetaminophen    Objective: He is observed this morning lying in bed with the head of the bed mildly elevated and pain well to moderately controlled.  He remained stable and intact neurologically to his baseline from admission with little or no changes noted overnight.  Fiducial markers were placed this morning in anticipation of a CT scan to be imaged with and without contrast utilizing Stryker protocol for navigational purposes and surgical planning.    Vitals: BP (!) 128/56    Pulse (!) 44     Temp 98.5 ??F (36.9 ??C) (Oral)    Resp 15    Wt 258 lb (117 kg)    SpO2 (!) 88%    BMI 37.02 kg/m??     Physical Exam     Patient awake alert and oriented x3  Slightly confused  Has difficulty to find words  GCS: 15/15   Pupils: equal and reactive   FCx4 with good strength through out (as a pronator drift in the left).  Sensory: grossly intact  Reflexes: 2+ through out  Planter reflex: Down response     Physical Exam:  Alert and attentive.  Language appropriate, with no aphasia.  Pupils equal.  Facial strength symmetric.    Review of Systems     Constitutional:  Negative for fever.   HENT:  Negative for congestion.    Eyes:  Positive for visual disturbance.   Respiratory:  Negative for apnea.    Cardiovascular:  Negative for chest pain.   Gastrointestinal:  Negative for abdominal distention.   Genitourinary:  Negative for difficulty urinating.   Musculoskeletal:  Negative for back pain.   Neurological:  Positive for weakness.   Psychiatric/Behavioral:  Positive for confusion.      24 hour intake/output:  Intake/Output Summary (Last  24 hours) at 07/25/2021 0758  Last data filed at 07/25/2021 0606  Gross per 24 hour   Intake 1995.82 ml   Output 100 ml   Net 1895.82 ml     Last 3 weights:  Wt Readings from Last 3 Encounters:   07/25/21 258 lb (117 kg)   07/24/21 257 lb (116.6 kg)   07/23/21 257 lb (116.6 kg)         CBC:   Recent Labs     07/23/21  1800 07/24/21  0412 07/25/21  0540   WBC 8.0 8.0 12.2*   HGB 13.6* 13.1* 12.3*   PLT 294 272 278     BMP:    Recent Labs     07/23/21  1800 07/23/21  2258 07/24/21  0412 07/24/21  0711 07/25/21  0117 07/25/21  0324 07/25/21  0540   NA 138   < > 139   140   < > 138 140 140   K 4.3  --  4.8  --   --   --  4.2   CL 105  --  106  --   --   --  109   CO2 21*  --  18*  --   --   --  19*   BUN 15  --  14  --   --   --  18   CREATININE 1.3*  --  1.2  --   --   --  1.2   GLUCOSE 171*  --  175*  --   --   --  187*    < > = values in this interval not displayed.     Calcium:  Recent Labs      07/25/21  0540   CALCIUM 9.2     Magnesium:  Recent Labs     07/25/21  0540   MG 1.9     Glucose:  Recent Labs     07/24/21  1510 07/24/21  1607 07/24/21  1960   POCGLU 372* 378* 245*     HgbA1C:   Recent Labs     07/24/21  0412   LABA1C 6.9*     Lipids:   Recent Labs     07/24/21  0412   CHOL 179   TRIG 123   HDL 41   [place] 113       Radiology reports as per the Radiologist  Radiology: CT HEAD WO CONTRAST    Result Date: 07/23/2021  PROCEDURE: CT HEAD WO CONTRAST CLINICAL INFORMATION:AMS COMPARISON: None TECHNIQUE: 5 mm axial imaging through the head without IV contrast. All CT scans at this facility use dose modulation, iterative reconstruction, and/or weight based dosing when appropriate to reduce the radiation dose to as low as reasonably achievable. FINDINGS: A 4.7 x 2.1 cm mass in the left frontal lobe with vasogenic edema. There is a 5.4 x 3.1 cm mass in the right parietotemporal lobe with surrounding basal genic edema. There is mass effect. There is midline shift to the left 6 mm at the level of the septum  pellucidum. A hypodense region in the inferior right frontal lobe is noted that likely relates to underlying edematous change. Mild cerebral volume loss. No ventriculomegaly.  No acute intracranial hemorrhage. No intracranial collection. The posterior fossa is unremarkable. The craniocervical junction is unremarkable. No acute bony abnormality. Mild mucosal thickening of the maxillary sinuses, the ethmoid sinuses. Polyp or mucous retention cyst is seen in the right frontal sinus. Otherwise, the  remaining paranasal sinuses are clear. The  mastoid air cells are clear. The orbits are unremarkable.     1. A left frontal lobe mass and a left parietotemporal lobe mass are noted as described above. There is midline shift to the left at the level of the septum pellucidum related to subfalcine herniation. Differential diagnoses would include metastatic disease, glioblastoma multiforme, abscess amongst other  possibilities. The critical  finding(s) was called to Francisca December PA-C at 1807 hours on 07/23/2021 by Dr. Bobbye Charleston. Verbal acknowledgment and readback was given. **This report has been created using voice recognition software.  It may contain minor errors which are inherent in voice recognition technology.** Final report electronically signed by Dr Varney Biles on 07/23/2021 6:07 PM    CT CHEST W CONTRAST    Result Date: 07/23/2021  CT CHEST WITH CONTRAST COMPARISON: 03/21/2021. FINDINGS: There are no acute infiltrates, consolidations, or pleural effusions. A nodule in the right lower lobe measures 5-6 mm on axial image 32. This does not appear to be significantly changed. Tiny 2 mm nodule in the right middle lobe on axial image 37 does not appear to be significantly changed. 5-6 mm nodule in the right lower lobe on axial image 47 does not appear to be significantly changed. Multiple small nodules are noted within the left lower lobe. 1 of the nodules, seen on axial image 37, appears larger compared to the prior study and measures 5-6 mm on axial image 37. Previously it measured approximately 3 mm. There is no adenopathy. Cardiac size is upper limits of normal. Thoracic aorta is partially obscured by motion/streak artifact but is normal in caliber without evidence of an aneurysm. Ovoid 9 mm nodule is suspected in the right thyroid lobe on axial image 8, this is partially obscured by streak artifact but does not appear to be significantly changed. The bone windows demonstrate no acute abnormalities. Old healed fracture is noted involving the right seventh rib. The CT abdomen/pelvis study will be reported separately.     1. Bilateral pulmonary nodules are noted. 1 of the nodules which is located in the left lower lobe appears larger compared to the prior study and measures 5-6 mm. Previously it measured 3 mm. Although this could be due at least in part to differences in slice selection, neoplastic disease is  questioned. Short-term follow-up is advised. 2. Additional findings are detailed above. This document has been electronically signed by: Isaias Sakai, M.D. on 07/23/2021 10:53 PM All CTs at this facility use dose modulation techniques and iterative reconstructions, and/or weight-based dosing when appropriate to reduce radiation to a low as reasonably achievable.    MRI LUMBAR SPINE W WO CONTRAST    Result Date: 07/25/2021  PROCEDURE: MRI LUMBAR SPINE W WO CONTRAST CLINICAL INFORMATION: evaluate L2 vertebral body neoplastic disease, history of RCC, new intracranial tumors. COMPARISON: CT abdomen and pelvis 07/23/2021. TECHNIQUE: Sagittal and axial T1 and T2-weighted images were obtained to the lumbar spine. Postcontrast axial and sagittal T1-weighted images were also obtained. FINDINGS: The lumbar vertebral bodies are normally aligned.  There is a 8 mm small indeterminate lesion in L2. This corresponds to the finding on CT. There does appear to be some central enhancement. This could represent a small metastasis but is indeterminate. There are no other bone lesions. There is some edema associated with facet degenerative changes at the L4-5 level. There is some associated enhancement which appears degenerative in nature.   There are no compression fractures.  No pars defects are noted.  There is mild disc desiccation at the L3-4 and L5-S1 levels.  The visualized aspects of the distal spinal cord are normal. The nerve roots of the cauda equina and the tip of the conus are normal. There are no gross abnormalities in the distal thoracic spine. On the axial images, at T12-L1 through L2-3, there are no degenerative changes. There is no spinal canal or foraminal stenosis. At L3-4, there are mild facet degenerative changes. There is no spinal canal stenosis. There is no foraminal stenosis. At L4-5, there are mild facet degenerative changes. There is no focal disc abnormality. There is no spinal canal stenosis. There is no  foraminal stenosis. At L5-S1, there are mild facet degenerative changes. There is a diffuse disc bulge. There is mild narrowing of the left subarticular recess. There is no spinal canal stenosis. There is moderate severity right and mild left foraminal stenosis. There is no abnormal enhancement within the spinal canal. There are no suspicious findings in the visualized aspects of the retroperitoneum and paraspinal soft tissues.      1. 8 mm indeterminate lesion in L2. This could represent a small metastasis. Other etiologies are not excluded. 2. No evidence of tumor within the spinal canal. 3. Moderate severity right and mild left foraminal stenosis at the L5-S1 level due to degenerative changes. **This report has been created using voice recognition software. It may contain minor errors which are inherent in voice recognition technology.** Final report electronically signed by Dr. Carmie End on 07/25/2021 6:06 AM    MRI ABDOMEN W WO CONTRAST    Result Date: 07/24/2021  PROCEDURE: MRI ABDOMEN W WO CONTRAST CLINICAL INFORMATION: evaluate liver enhancing lesion, history of RCC, new intracranial tumors COMPARISON: CT abdomen and pelvis 07/23/2021 TECHNIQUE: Multisequence and multiplanar MRI of the abdomen was performed without and with  contrast. T1 and T2 contrast information were emphasized. CONTRAST: 20  mL of ProHance  intravenously. FINDINGS: Motion artifacts degrade images. LOWER THORAX: No significant lower thoracic findings. HEPATOBILIARY: No focal hepatic mass.  Unremarkable hepatic surface morphology. The gallbladder, and biliary tree are unremarkable. PANCREAS AND SPLEEN: The pancreas is markedly atrophic. No peripancreatic abnormality.  The spleen is not enlarged RETROPERITONEUM: The left kidney is surgically absent. No residual mass is seen in the postoperative bed. The left adrenal gland is also surgically absent. The right adrenal gland is unremarkable. No hydronephrosis. Tiny right renal cyst is seen.  BOWEL AND PERITONEUM:  No bowel obstruction or acute inflammatory bowel process.  VASCULAR: The abdominal aorta is not aneurysmal. The main portal vein, splenic vein and superior mesenteric vein are patent. LYMPH NODES: No significantly enlarged lymph nodes are seen. BONES: No acute bony abnormality. OTHER: None.     1. No abnormal enhancing hepatic mass is seen. 2. Status post left nephrectomy. No residual or recurrent disease in the surgical bed. **This report has been created using voice recognition software.  It may contain minor errors which are inherent in voice recognition technology.** Final report electronically signed by Dr Varney Biles on 07/24/2021 9:21 PM    CT ABDOMEN PELVIS W IV CONTRAST Additional Contrast? None    Result Date: 07/23/2021  CT ABDOMEN AND PELVIS WITH CONTRAST COMPARISON: 02/27/2021. FINDINGS: A few enhancing lesions are noted within the liver. These lesions are better seen on the CT chest study, for example located in the right hepatic lobe, measuring 7-8 mm on axial image 46. Another faint enhancing lesion located more inferiorly in the right hepatic lobe measures 6-7 mm on axial  image 58. The gallbladder is unremarkable. The stomach is significantly underdistended which precludes accurate assessment. A small hiatal hernia is noted. The pancreas and spleen are unremarkable. Right adrenal gland is unremarkable. Subcentimeter low-attenuation lesion is again noted within the right kidney which is too small to characterize. A subcentimeter stone is noted in the upper pole of the right kidney, without hydronephrosis. Postoperative changes of a prior left-sided nephrectomy are again noted. Abdominal aorta is normal in caliber, without evidence of an aneurysm or dissection. 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 There is no evidence of a small bowel obstruction or free air. There is no pericolonic inflammation. Urinary bladder is  underdistended. Fat-containing inguinal hernias are again noted. The bone windows demonstrate a subcentimeter sclerotic lesion within the L2 vertebral body level on coronal image 44, this is new since the prior study. Chronic mild loss of height of the L5 vertebral body level is again noted.     1. A few subcentimeter enhancing lesions are noted within the liver, these are better seen on the CT chest study performed on the same day. Neoplastic disease will need to be excluded. Follow-up nonurgent liver MRI, without and with contrast, is advised. 2. A subcentimeter sclerotic lesion is noted within the L2 vertebral body which is new since the prior study. This is also suspicious for neoplastic disease. 3. Additional findings are detailed above. This document has been electronically signed by: Isaias Sakai, M.D. on 07/23/2021 11:08 PM All CTs at this facility use dose modulation techniques and iterative reconstructions, and/or weight-based dosing when appropriate to reduce radiation to a low as reasonably achievable.    XR CHEST PORTABLE    Result Date: 07/23/2021  PROCEDURE: XR CHEST PORTABLE CLINICAL INFORMATION: cough COMPARISON: None TECHNIQUE: AP portable chest radiograph performed. FINDINGS: No focal pulmonary consolidation. Cardiac silhouette is not enlarged. No pleural effusion. No pneumothorax. No acute bony abnormality.     1. No acute cardiopulmonary finding. **This report has been created using voice recognition software.  It may contain minor errors which are inherent in voice recognition technology.** Final report electronically signed by Dr Varney Biles on 07/23/2021 6:26 PM    MRI BRAIN W WO CONTRAST    Result Date: 07/24/2021  PROCEDURE: MRI BRAIN W [place] INFORMATIONEvaluate brain massesaltered mental status. COMPARISON: Head CT 07/23/2021. TECHNIQUE: Multiplanar and multiple spin echo T1 and T2-weighted images were obtained through the brain before and after the administration of intravenous  contrast. Postcontrast images were obtained according to the Stryker protocol. FINDINGS: In the posterior left frontal lobe there is a 4.4 x 4.3 x 3.2 cm heterogeneously enhancing intra-axial mass. This has lobulated margins. There is some central necrosis. There is some associated susceptibility artifact consistent with some associated blood products. In the right temporal occipital location, there is a 4.3 x 3.5 x 4.1 cm intra-axial mass. This has thick heterogeneous enhancement and lobulated margins. There are associated blood products. In the inferior right occipital lobe there is a lobulated intra-axial mass versus 2 adjacent masses which abut one another. This measures 1.7 x 1.6 x 1.1 cm. This has lobulated margins and relatively uniform enhancement. There are associated blood products. On the FLAIR sequence, there is a moderate amount of abnormal signal surrounding each of the masses consistent with surrounding vasogenic edema. On the right, this involves portions of the occipital lobe, temporal lobe and extends into the right basal ganglia. This also extends into the right parietal lobe.  On the left, this extends throughout the left frontal lobe. There is no restricted diffusion to suggest an acute infarct. The brain volume is mildly reduced.. There are no extra-axial collections.  There is mass effect upon the temporal horn and occipital horn the right lateral ventricle. There is some mild mass effect upon the body of the left lateral ventricle. There is slight midline shift to the left. There is no hydrocephalus. On the FLAIR and T2-weighted sequences, there is mild signal hyperintensity scattered in the white matter of the brain. This is most consistent with mild severity chronic small vessel ischemic changes. On the gradient echo T2-weighted images, there is susceptibility artifact associated with each of the intra-axial masses consistent with blood products.  The major intracranial vascular flow voids  are present.  The midline craniocervical junction structures are normal.  The brainstem and pituitary gland are normal. The midline frontal sinus is opacified. There is mucosal thickening in the mastoid air cells.      1. Lobulated intra-axial 3 masses and associated blood products consistent with hemorrhage. This is most consistent with metastatic disease. 2. Surrounding vasogenic edema with slight midline shift to the left. 3. Mild severity chronic small vessel ischemic changes. **This report has been created using voice recognition software. It may contain minor errors which are inherent in voice recognition technology.** Final report electronically signed by Dr. Carmie End on 07/24/2021 7:10 AM                   Assessment: Lobulated intra-axial 3 masses and associated blood product consistent with hemorrhage concerning for metastatic disease.    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 *        Plan: Patient is seen and evaluated in the ICU setting with evaluation exam findings reviewed and discussed with Dr. Gardenia Phlegm with nursing.  She is resting comfortably with pain well to moderately controlled and remained stable and intact neurologically to his baseline with no additional significant changes noted overnight.  Fiducial markers were placed this morning in anticipation of CT scan of the head to be imaged with and without contrast utilizing Stryker protocol for navigational purposes.  Neurosurgical intervention in the form of right-sided craniotomy for maximal safe resection of right temporal occipital brain tumors is anticipated.  Neurosurgery to follow      Electronically signed by Brayton El, PA-C on 07/25/2021 at 7:58 AM    Neurosurgery

## 2021-07-26 ENCOUNTER — Inpatient Hospital Stay: Admit: 2021-07-26 | Payer: BLUE CROSS/BLUE SHIELD | Primary: Family Medicine

## 2021-07-26 LAB — CBC WITH AUTO DIFFERENTIAL
Basophils Absolute: 0 10*3/uL (ref 0.0–0.1)
Basophils: 0.1 %
Eosinophils Absolute: 0 10*3/uL (ref 0.0–0.4)
Eosinophils: 0 %
Hematocrit: 35.2 % — ABNORMAL LOW (ref 42.0–52.0)
Hemoglobin: 11.7 gm/dl — ABNORMAL LOW (ref 14.0–18.0)
Immature Grans (Abs): 0.06 10*3/uL (ref 0.00–0.07)
Immature Granulocytes: 0.5 %
Lymphocytes Absolute: 0.8 10*3/uL — ABNORMAL LOW (ref 1.0–4.8)
Lymphocytes: 6.8 %
MCH: 28.6 pg (ref 26.0–33.0)
MCHC: 33.2 gm/dl (ref 32.2–35.5)
MCV: 86.1 fL (ref 80.0–94.0)
MPV: 10.7 fL (ref 9.4–12.4)
Monocytes Absolute: 0.3 10*3/uL — ABNORMAL LOW (ref 0.4–1.3)
Monocytes: 2.6 %
Platelets: 284 10*3/uL (ref 130–400)
RBC: 4.09 10*6/uL — ABNORMAL LOW (ref 4.70–6.10)
RDW-CV: 14.1 % (ref 11.5–14.5)
RDW-SD: 43.8 fL (ref 35.0–45.0)
Seg Neutrophils: 90 %
Segs Absolute: 10.3 10*3/uL — ABNORMAL HIGH (ref 1.8–7.7)
WBC: 11.4 10*3/uL — ABNORMAL HIGH (ref 4.8–10.8)
nRBC: 0 /100 wbc

## 2021-07-26 LAB — BLOOD GAS, ARTERIAL
Base Excess (Calculated): -6.4 mmol/l — ABNORMAL LOW (ref <=2.5)
COLLECTED BY:: 136758
HCO3: 20 mmol/l — ABNORMAL LOW (ref 23–28)
O2 Sat: 100 %
PCO2: 40 mmhg (ref 35–45)
PO2: 213 mmhg — ABNORMAL HIGH (ref 71–104)
pH, Blood Gas: 7.3 — ABNORMAL LOW (ref 7.35–7.45)

## 2021-07-26 LAB — TYPE AND SCREEN
Antibody Screen: NEGATIVE
Rh Factor: POSITIVE

## 2021-07-26 LAB — GLOMERULAR FILTRATION RATE, ESTIMATED: Est, Glom Filt Rate: >60 mL/min/{1.73_m2} (ref >60)

## 2021-07-26 LAB — CORTISOL TOTAL
Cortisol Collection Info: 30
Cortisol Collection Info: 30
Cortisol: 0.37 ug/dL
Cortisol: 9.37 ug/dL

## 2021-07-26 LAB — BASIC METABOLIC PANEL W/ REFLEX TO MG FOR LOW K
BUN: 20 mg/dL (ref 7–22)
CO2: 19 meq/L — ABNORMAL LOW (ref 23–33)
Calcium: 9 mg/dL (ref 8.5–10.5)
Chloride: 112 meq/L — ABNORMAL HIGH (ref 98–111)
Creatinine: 1.1 mg/dL (ref 0.4–1.2)
Glucose: 207 mg/dL — ABNORMAL HIGH (ref 70–108)
Potassium reflex Magnesium: 4.3 meq/L (ref 3.5–5.2)
Sodium: 144 meq/L (ref 135–145)

## 2021-07-26 LAB — POCT GLUCOSE
POC Glucose: 196 mg/dl — ABNORMAL HIGH (ref 70–108)
POC Glucose: 211 mg/dl — ABNORMAL HIGH (ref 70–108)
POC Glucose: 240 mg/dl — ABNORMAL HIGH (ref 70–108)

## 2021-07-26 LAB — SODIUM
Sodium: 143 meq/L (ref 135–145)
Sodium: 141 meq/L (ref 135–145)
Sodium: 145 meq/L (ref 135–145)
Sodium: 142 meq/L (ref 135–145)

## 2021-07-26 LAB — CALCIUM IONIZED SERUM: Calcium,Ionized: 1.25 mmol/L (ref 1.12–1.32)

## 2021-07-26 LAB — SODIUM, WHOLE BLOOD: Sodium, Whole Blood: 145 meq/l (ref 138–146)

## 2021-07-26 LAB — ANION GAP: Anion Gap: 13 meq/L (ref 8.0–16.0)

## 2021-07-26 LAB — GLUCOSE, WHOLE BLOOD: Glucose, Whole Blood: 183 mg/dl — ABNORMAL HIGH (ref 70–108)

## 2021-07-26 LAB — POTASSIUM, WHOLE BLOOD: Potassium, Whole Blood: 4.3 meq/l (ref 3.5–4.9)

## 2021-07-26 MED ORDER — HYDROMORPHONE HCL 2 MG/ML IJ SOLN
2 MG/ML | INTRAMUSCULAR | Status: AC
Start: 2021-07-26 — End: ?

## 2021-07-26 MED ORDER — SUGAMMADEX SODIUM 200 MG/2ML IV SOLN
200 MG/2ML | INTRAVENOUS | Status: DC | PRN
Start: 2021-07-26 — End: 2021-07-26
  Administered 2021-07-26: 19:00:00 400 via INTRAVENOUS

## 2021-07-26 MED ORDER — PROPOFOL 200 MG/20ML IV EMUL
200 MG/20ML | INTRAVENOUS | Status: AC
Start: 2021-07-26 — End: ?

## 2021-07-26 MED ORDER — SODIUM BICARBONATE 8.4 % IV SOLN
8.4 % | INTRAVENOUS | Status: AC
Start: 2021-07-26 — End: ?

## 2021-07-26 MED ORDER — HYDROMORPHONE HCL 2 MG/ML IJ SOLN
2 MG/ML | INTRAMUSCULAR | Status: DC | PRN
Start: 2021-07-26 — End: 2021-07-26
  Administered 2021-07-26 (×2): 1 via INTRAVENOUS

## 2021-07-26 MED ORDER — TRANEXAMIC ACID 1000 MG/10ML IV SOLN
1000 MG/10ML | INTRAVENOUS | Status: DC | PRN
Start: 2021-07-26 — End: 2021-07-26
  Administered 2021-07-26: 14:00:00 1000 via INTRAVENOUS

## 2021-07-26 MED ORDER — GLYCOPYRROLATE 0.2 MG/ML IJ SOLN
0.2 MG/ML | INTRAMUSCULAR | Status: AC
Start: 2021-07-26 — End: ?

## 2021-07-26 MED ORDER — PHENYLEPHRINE HCL 10 MG/ML IV SOLN
10 MG/ML | INTRAVENOUS | Status: DC | PRN
Start: 2021-07-26 — End: 2021-07-26
  Administered 2021-07-26: 16:00:00 200 via INTRAVENOUS
  Administered 2021-07-26 (×3): 100 via INTRAVENOUS

## 2021-07-26 MED ORDER — MANNITOL 25 % IV SOLN
25 % | INTRAVENOUS | Status: AC
Start: 2021-07-26 — End: ?

## 2021-07-26 MED ORDER — PROPOFOL 1000 MG/100ML IV EMUL
1000 MG/100ML | INTRAVENOUS | Status: DC | PRN
Start: 2021-07-26 — End: 2021-07-26
  Administered 2021-07-26 (×3): 100 via INTRAVENOUS

## 2021-07-26 MED ORDER — LACTATED RINGERS IV SOLN
INTRAVENOUS | Status: DC | PRN
Start: 2021-07-26 — End: 2021-07-26
  Administered 2021-07-26: 13:00:00 via INTRAVENOUS

## 2021-07-26 MED ORDER — ROCURONIUM BROMIDE 50 MG/5ML IV SOLN
50 MG/5ML | INTRAVENOUS | Status: AC
Start: 2021-07-26 — End: ?

## 2021-07-26 MED ORDER — BACITRACIN 500 UNIT/GM EX OINT
500 UNIT/GM | CUTANEOUS | Status: DC | PRN
Start: 2021-07-26 — End: 2021-07-26
  Administered 2021-07-26: 15:00:00 1 via TOPICAL

## 2021-07-26 MED ORDER — DEXAMETHASONE SOD PHOSPHATE PF 10 MG/ML IJ SOSY
10 MG/ML | INTRAMUSCULAR | Status: DC | PRN
Start: 2021-07-26 — End: 2021-07-26
  Administered 2021-07-26: 13:00:00 10 via INTRAVENOUS

## 2021-07-26 MED ORDER — CEFAZOLIN SODIUM 1 G IJ SOLR
1 g | INTRAMUSCULAR | Status: AC
Start: 2021-07-26 — End: ?

## 2021-07-26 MED ORDER — EPHEDRINE SULFATE-NACL 50-0.9 MG/5ML-% IV SOSY
INTRAVENOUS | Status: DC | PRN
Start: 2021-07-26 — End: 2021-07-26
  Administered 2021-07-26 (×2): 20 via INTRAVENOUS
  Administered 2021-07-26: 15:00:00 10 via INTRAVENOUS

## 2021-07-26 MED ORDER — PHENYLEPHRINE HCL 10 MG/ML IV SOLN
10 MG/ML | INTRAVENOUS | Status: AC
Start: 2021-07-26 — End: ?

## 2021-07-26 MED ORDER — LIDOCAINE HCL (CARDIAC) 100 MG/5ML IV SOSY
100 MG/5ML | INTRAVENOUS | Status: AC
Start: 2021-07-26 — End: ?

## 2021-07-26 MED ORDER — GLYCOPYRROLATE 0.2 MG/ML IJ SOLN
0.2 MG/ML | INTRAMUSCULAR | Status: DC | PRN
Start: 2021-07-26 — End: 2021-07-26
  Administered 2021-07-26: 14:00:00 .2 via INTRAVENOUS

## 2021-07-26 MED ORDER — SUGAMMADEX SODIUM 200 MG/2ML IV SOLN
200 MG/2ML | INTRAVENOUS | Status: AC
Start: 2021-07-26 — End: ?

## 2021-07-26 MED ORDER — PROPOFOL 200 MG/20ML IV EMUL
200 MG/20ML | INTRAVENOUS | Status: DC | PRN
Start: 2021-07-26 — End: 2021-07-26
  Administered 2021-07-26: 13:00:00 150 via INTRAVENOUS
  Administered 2021-07-26: 13:00:00 50 via INTRAVENOUS

## 2021-07-26 MED ORDER — BACITRACIN 500 UNIT/GM EX OINT
500 UNIT/GM | CUTANEOUS | Status: AC
Start: 2021-07-26 — End: ?

## 2021-07-26 MED ORDER — TRANEXAMIC ACID 1000 MG/10ML IV SOLN
1000 MG/10ML | INTRAVENOUS | Status: AC
Start: 2021-07-26 — End: ?

## 2021-07-26 MED ORDER — COSYNTROPIN 0.25 MG IJ SOLR
0.25 MG | Freq: Once | INTRAMUSCULAR | Status: AC
Start: 2021-07-26 — End: 2021-07-27
  Administered 2021-07-27: 13:00:00 250 ug via INTRAVENOUS

## 2021-07-26 MED ORDER — LIDOCAINE (CARDIAC) 100 MG/5ML IV SOLN (MIXTURES ONLY)
100 MG/5ML | INTRAVENOUS | Status: AC
Start: 2021-07-26 — End: ?

## 2021-07-26 MED ORDER — LIDOCAINE HCL (CARDIAC) 100 MG/5ML IV SOSY
100 MG/5ML | INTRAVENOUS | Status: DC | PRN
Start: 2021-07-26 — End: 2021-07-26
  Administered 2021-07-26: 13:00:00 100 via INTRAVENOUS
  Administered 2021-07-26: 19:00:00 200 via INTRAVENOUS

## 2021-07-26 MED ORDER — MANNITOL 25 % IV SOLN
25 % | INTRAVENOUS | Status: DC | PRN
Start: 2021-07-26 — End: 2021-07-26
  Administered 2021-07-26: 14:00:00 50 via INTRAVENOUS

## 2021-07-26 MED ORDER — CEFAZOLIN SODIUM 1 G IJ SOLR
1 g | INTRAMUSCULAR | Status: DC | PRN
Start: 2021-07-26 — End: 2021-07-26
  Administered 2021-07-26 (×2): 2 via INTRAVENOUS

## 2021-07-26 MED ORDER — FENTANYL CITRATE (PF) 100 MCG/2ML IJ SOLN
100 MCG/2ML | INTRAMUSCULAR | Status: AC
Start: 2021-07-26 — End: ?

## 2021-07-26 MED ORDER — ESMOLOL HCL 100 MG/10ML IV SOLN
100 MG/10ML | INTRAVENOUS | Status: DC | PRN
Start: 2021-07-26 — End: 2021-07-26
  Administered 2021-07-26: 13:00:00 100 via INTRAVENOUS

## 2021-07-26 MED ORDER — PROPOFOL 1000 MG/100ML IV EMUL
1000 MG/100ML | INTRAVENOUS | Status: AC
Start: 2021-07-26 — End: ?

## 2021-07-26 MED ORDER — LEVETIRACETAM 500 MG/5ML IV SOLN
500 | Freq: Once | INTRAVENOUS | Status: AC
Start: 2021-07-26 — End: 2021-07-26
  Administered 2021-07-26: 14:00:00 500 mg via INTRAVENOUS

## 2021-07-26 MED ORDER — NITROGLYCERIN 500 MCG / 10 ML VIAL
Status: AC
Start: 2021-07-26 — End: ?

## 2021-07-26 MED ORDER — SODIUM CHLORIDE 0.9 % IV SOLN
0.9 % | INTRAVENOUS | Status: DC | PRN
Start: 2021-07-26 — End: 2021-07-26
  Administered 2021-07-26: 13:00:00 via INTRAVENOUS

## 2021-07-26 MED ORDER — ROCURONIUM BROMIDE 50 MG/5ML IV SOLN
50 MG/5ML | INTRAVENOUS | Status: DC | PRN
Start: 2021-07-26 — End: 2021-07-26
  Administered 2021-07-26: 13:00:00 100 via INTRAVENOUS
  Administered 2021-07-26 (×2): 20 via INTRAVENOUS
  Administered 2021-07-26: 15:00:00 50 via INTRAVENOUS
  Administered 2021-07-26: 18:00:00 10 via INTRAVENOUS

## 2021-07-26 MED ORDER — SODIUM CHLORIDE 3 % IV SOLN
3 % | INTRAVENOUS | Status: AC
Start: 2021-07-26 — End: 2021-07-27
  Administered 2021-07-26 (×2): 60 mL/h via INTRAVENOUS

## 2021-07-26 MED ORDER — NITROGLYCERIN 500 MCG / 10 ML VIAL
Status: DC | PRN
Start: 2021-07-26 — End: 2021-07-26
  Administered 2021-07-26: 19:00:00 50 via BUCCAL

## 2021-07-26 MED ORDER — ESMOLOL HCL 100 MG/10ML IV SOLN
100 MG/10ML | INTRAVENOUS | Status: AC
Start: 2021-07-26 — End: ?

## 2021-07-26 MED ORDER — DEXAMETHASONE SOD PHOSPHATE PF 10 MG/ML IJ SOSY
10 MG/ML | INTRAMUSCULAR | Status: AC
Start: 2021-07-26 — End: ?

## 2021-07-26 MED ORDER — SODIUM BICARBONATE 8.4 % IV SOLN
8.4 % | INTRAVENOUS | Status: DC | PRN
Start: 2021-07-26 — End: 2021-07-26
  Administered 2021-07-26: 15:00:00 50 via INTRAVENOUS

## 2021-07-26 MED ORDER — FENTANYL CITRATE (PF) 100 MCG/2ML IJ SOLN
100 MCG/2ML | INTRAMUSCULAR | Status: DC | PRN
Start: 2021-07-26 — End: 2021-07-26
  Administered 2021-07-26: 18:00:00 100 via INTRAVENOUS

## 2021-07-26 MED ORDER — EPHEDRINE SULFATE-NACL 50-0.9 MG/5ML-% IV SOSY
INTRAVENOUS | Status: AC
Start: 2021-07-26 — End: ?

## 2021-07-26 MED FILL — LEVETIRACETAM 500 MG/5ML IV SOLN: 500 MG/5ML | INTRAVENOUS | Qty: 5

## 2021-07-26 MED FILL — DEXAMETHASONE SODIUM PHOSPHATE 4 MG/ML IJ SOLN: 4 MG/ML | INTRAMUSCULAR | Qty: 1

## 2021-07-26 MED FILL — ROCURONIUM BROMIDE 50 MG/5ML IV SOLN: 50 MG/5ML | INTRAVENOUS | Qty: 5

## 2021-07-26 MED FILL — SODIUM CHLORIDE 3 % IV SOLN: 3 % | INTRAVENOUS | Qty: 500

## 2021-07-26 MED FILL — PHENYLEPHRINE HCL (PRESSORS) 10 MG/ML IV SOLN: 10 MG/ML | INTRAVENOUS | Qty: 1

## 2021-07-26 MED FILL — GLYCOPYRROLATE 0.2 MG/ML IJ SOLN: 0.2 MG/ML | INTRAMUSCULAR | Qty: 1

## 2021-07-26 MED FILL — BACITRACIN 500 UNIT/GM EX OINT: 500 UNIT/GM | CUTANEOUS | Qty: 28

## 2021-07-26 MED FILL — PROPOFOL 200 MG/20ML IV EMUL: 200 MG/20ML | INTRAVENOUS | Qty: 20

## 2021-07-26 MED FILL — MANNITOL 25 % IV SOLN: 25 % | INTRAVENOUS | Qty: 100

## 2021-07-26 MED FILL — CEFAZOLIN SODIUM 1 G IJ SOLR: 1 g | INTRAMUSCULAR | Qty: 1000

## 2021-07-26 MED FILL — NITROGLYCERIN 500 MCG / 10 ML VIAL: Qty: 1

## 2021-07-26 MED FILL — HYDROMORPHONE HCL 2 MG/ML IJ SOLN: 2 MG/ML | INTRAMUSCULAR | Qty: 1

## 2021-07-26 MED FILL — BRIDION 200 MG/2ML IV SOLN: 200 MG/2ML | INTRAVENOUS | Qty: 2

## 2021-07-26 MED FILL — TRANEXAMIC ACID 1000 MG/10ML IV SOLN: 1000 MG/10ML | INTRAVENOUS | Qty: 20

## 2021-07-26 MED FILL — SODIUM BICARBONATE 8.4 % IV SOLN: 8.4 % | INTRAVENOUS | Qty: 50

## 2021-07-26 MED FILL — HUMALOG 100 UNIT/ML IJ SOLN: 100 UNIT/ML | INTRAMUSCULAR | Qty: 4

## 2021-07-26 MED FILL — LIDOCAINE HCL (CARDIAC) PF 100 MG/5ML IV SOSY: 100 MG/5ML | INTRAVENOUS | Qty: 5

## 2021-07-26 MED FILL — DEXAMETHASONE SOD PHOSPHATE PF 10 MG/ML IJ SOSY: 10 MG/ML | INTRAMUSCULAR | Qty: 1

## 2021-07-26 MED FILL — ESMOLOL HCL 100 MG/10ML IV SOLN: 100 MG/10ML | INTRAVENOUS | Qty: 10

## 2021-07-26 MED FILL — FENTANYL CITRATE (PF) 100 MCG/2ML IJ SOLN: 100 MCG/2ML | INTRAMUSCULAR | Qty: 2

## 2021-07-26 MED FILL — LIDOCAINE HCL (CARDIAC) 100 MG/5ML IV SOSY: 100 MG/5ML | INTRAVENOUS | Qty: 5

## 2021-07-26 MED FILL — DIPRIVAN 1000 MG/100ML IV EMUL: 1000 MG/100ML | INTRAVENOUS | Qty: 300

## 2021-07-26 MED FILL — EPHEDRINE SULFATE-NACL 50-0.9 MG/5ML-% IV SOSY: INTRAVENOUS | Qty: 5

## 2021-07-26 NOTE — Op Note (Signed)
[facility]    Patient name: Marcus Burnett  Medical Record Number: [id number]  Account Number: [id number]      Date of Procedure: 07/26/2021    Pre-operative Diagnosis:    Multiple brain metastatic disease.  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   Metastatic brain tumor in the right temporal occipital area.   Metastatic brain tumor in the left frontal area  Brain edema.  Brain midline shift.    Post-operative Diagnosis: The same     PROCEDURE:    Right posterior temporal and occipital craniotomy.  Surgical resection of right occipital brain intraparenchymal tumor.  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  Using the surgical microscope.  Using the intraoperative neuro-navigation.  Application of [place] head 3-pins head fixation system.    Anesthesia: General endotracheal    Surgeons/Assistants: Livia Tarr, MD     Estimated Blood Loss: 200 ml    Drains: On JP drain     Blood Transfusions: None     Complications: none immediately appreciated    Specimens:  From posterior temporal and occipital brain intraparenchymal tumor.    Indications for Procedure:    (Please see my separate consultation note and progress note for this patient for more information).  This is 61  -year-old  M who has a history of  renal cell cancer who underwent brain MRI that showed findings  consistent with multiple brain metastatic tumor/disease in left frontal, right posterior temporal and right occipital area.     Decision making:      -Given the current clinical picture, his current neurological exam findings, his MRI radiological features and the wide range of his newly diagnosed brain lesions and and after thoroughly discussion with other treatment team ,  I would recommend for the patient surgical intervention in the form of" " right  sided  craniotomy for maximal safe resection of right  temporal and occipital  brain tumor(s)      The recommended   neurosurgical intervention was discussed with patient and his  family (wife) in detail (in front neurosurgery PA, Iona Beard and pateint's nurse, and ICU ANP,  Estill Bamberg ) , and the alternative treatment options, as well as the associated benefits and risks.     -I explained to the patient that the main goal of surgery are:  1-maximum safe resection of his right temporal and occipital tumors.  2-obtain tissue for definitive diagnosis  Patient does well with this upcoming surgery and based on the result of the biopsy, I will plan to take the patient for another surgical intervention in a few days later in the form left-sided craniotomy for maximal resection of left-sided brain tumor .  I discussed with him specifically the risk of possible intracranial bleed.  All questions and concerns were answered and addressed.  Patient  and his wife stated that he wants to proceed with the recommended surgical treatment option.    Intraoperative findings:            PROCEDURE:     Patient was brought to the OR where she was placed initially supine on the OR table.  Then, IV lines, A-line  and Foley catheter were inserted and maintained.     Following that we placed the pt in the prone position on the operating table. Next, patient's head was secured and fixed in [place] three-point head fixation system.  The patient's body was secured in the position by utilizing belts  and tapes.   Next, we did registration between patient and the neuronavigation system and   as we were satisfied with the accuracy of neuronavigation system, we shaved the patient's head (right posterior temporal and occipital areas).  Next,  We  localized the location of the tumors based of neuro navigation feedback.  The location of the tumors were on the right posterior temporal (the larger one ) and occipital( the small on)  areas. So we centered the up craniotomy on the right posterior temporal and occipital areas. Next, we proceeded in draping and prepping the  patient in standard fashion. Then, we proceeded with marking the skin incision.  After that I started with making the skin incision which was a  horseshoe skin incision in the right posterior temporal and occipital areas. I dissected the soft tissue under the skin and the muscle all the way down to reach the bone.  I used the neuronavigation to localize The location of the two tumors again. Then, I proceeded with making the bone flap using the high speed drill.  I managed to make the bone flap that includes/provide access to both tumors. Then, I  elevated the bone flap. Then, I localized again the tumors by the neuro-naviagtion.  Then I exposed the dura and did some coagulation of the dura.  After that, I made another localization for the tumor by utilizing the neuronavigation system. I applied several dural raising sutures.  Next, I proceeded with opening the dura in the cruciate fashion over the tumor located in the posterior tumor area ( the large one). I noted local significant swelling of the brain sulcus that that was almost  In the center to our surgical field. Then, I brought the microscope in, and under the microscope vision, able to reach the tumor after removing the thin layer of the brain cortex.  After that, and with the assistance of neuronavigation system, I able to localize the borders of the tumor.  After that, I sent several biopsies for frozen exam.     Then, I used a combination of microsurgical techniques, Sonopet and bipolar to debulk he tumor in piecemeal fashion.   At that time, the results of the frozen came back as more consistent with metastatic. After I reached the point that I thought that I achieved what appeared to be a gross total resection for the tumor, I proceeded with performing a meticulous hemostasis.  As I was satisfied with the hemostasis, I applied  FloSeal, fibrillar and Surgicel and then I did a copious  irrigation.   Following that, I moved my attention to the tumor that  located in the occipital area (the small tumor). With the the assistance of neuronavigation system, I able to localize the borders of the tumor.Then, I used a combination of microsurgical techniques, Sonopet and bipolar to resect he tumor in piecemeal fashion. After I reached the point that I thought that I achieved what appeared to be a gross total resection for the tumor, I proceeded with performing a meticulous hemostasis.  As I was satisfied with the hemostasis, I applied  FloSeal, fibrillar and Surgicel and then I did a copious irrigation.    Then, as the irrigated  Fluid kept returning back as a clear fluid and no obvious bleed, I proceeded  with closing the dura in a water-tight fashion using a piece of DuraGen and applied a layer of TISSEEL on the top of the dura.  Next, we returned back the bone flap and  secured the bone flap with screws and plates.    Next, We did a meticulous irrigation again.  After that we closed the wound in a standard fashion.    The patient tolerated the procedure very well without  intraoperative complications.  At the end of the surgery, patient was  flipped back to supine position and he was kept extubated.   Then patient was transferred to the PACU further observation and  treatment.     Brayton El PA-C ( Neurosurgery PA) assisted throughout the procedure with positioning, draping, retraction, wound closure, and dressing application.    Illene Labrador, MD, MD  Electronically signed by me on 07/26/2021 at 2:01 PM

## 2021-07-26 NOTE — Brief Op Note (Signed)
Brief Postoperative Note      Patient: Marcus Burnett  Date of Birth: 1960/08/01  MRN: [id number]    Date of Procedure: 07/26/2021    Pre-Op Diagnosis: Brain mass [G93.89]    Post-Op Diagnosis: Same       Procedure(s):  RIGHT SIDED CRANIOTOMY FOR 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    Surgeon(s):  Illene Labrador, MD    Assistant:  Physician Assistant: Brayton El, PA-C    Anesthesia: General    Estimated Blood Loss (mL): 297     Complications: None    Specimens:   ID Type Source Tests Collected by Time Destination   A : right side brain tumor Tissue Brain SURGICAL PATHOLOGY Asem Salma, MD 07/26/2021 1009    B : right side brain tumor  Tissue Brain [place], MD 07/26/2021 1011        Implants:  Implant Name Type Inv. Item Serial No. Manufacturer Lot No. LRB No. Used Action   PATCH DURAL SUB 7.5X7.5CM ONLAY LYCOPLANT - [id number]  PATCH DURAL SUB 7.5X7.5CM ONLAY LYCOPLANT  [employer] [id number] Right 1 Implanted   PLATE BUR H L [id number] 5 H TI BENT W/O TAB NONCOMPRESSION - [id number]  PLATE BUR H L [id number] 5 H TI BENT W/O TAB NONCOMPRESSION  [employer]  Right 5 Implanted   PLATE BUR H L [id number] 5 H TI BENT W/O TAB NONCOMPRESSION - [id number]  PLATE BUR H L ZMO29.4TM 5 H TI BENT W/O TAB NONCOMPRESSION  [employer]  Right 25 Implanted         Drains: JP  Urinary Catheter 07/26/21 Foley-Temperature (Active)       Findings: Post occipital craniotomy for resection of intercranial lesions    Electronically signed by Brayton El, PA-C on 07/26/2021 at 2:13 PM

## 2021-07-26 NOTE — Anesthesia Pre-Procedure Evaluation (Signed)
Department of Anesthesiology  Preprocedure Note       Name:  Marcus Burnett   Age:  61 y.o.  DOB:  [birth year]                                          MRN:  [id number]         Date:  07/26/2021      Surgeon: Juliann Mule):  Asem Jonne Ply, MD    Procedure: Procedure(s):  RIGHT SIDED CRANIOTOMY FOR RESECTION OF TUMOR    Medications prior to admission:   Prior to Admission medications    Medication Sig Start Date End Date Taking? Authorizing Provider   TRULICITY 1.5 WU/9.8JX SOPN INJECT 0.5 ML (1.5MG  DOSE) INTO THE SKIN EVERY 7 DAYS 11/01/20   Historical Provider, MD   metFORMIN (GLUCOPHAGE) 500 MG tablet TAKE 2 TABLETS (1,000 MG TOTAL) BY MOUTH 2 TIMES DAILY WITH MEALS. 11/06/20   Historical Provider, MD   glipiZIDE (GLUCOTROL XL) 10 MG extended release tablet TAKE 1 TABLET BY MOUTH EVERY DAY 10/10/20   Historical Provider, MD   pioglitazone (ACTOS) 15 MG tablet TAKE 1 TABLET (15 MG TOTAL) BY MOUTH DAILY. 10/09/20   Historical Provider, MD   lisinopril (PRINIVIL;ZESTRIL) 40 MG tablet TAKE 1 TABLET BY MOUTH EVERY DAY 11/19/20   Historical Provider, MD   pravastatin (PRAVACHOL) 80 MG tablet TAKE 1 TABLET BY MOUTH EVERY DAY 10/29/20   Historical Provider, MD   aspirin 81 MG EC tablet Take 81 mg by mouth in the morning.    Historical Provider, MD       Current medications:    Current Facility-Administered Medications   Medication Dose Route Frequency Provider Last Rate Last Admin   ??? sodium chloride 3 % solution  60 mL/hr IntraVENous Continuous Rajpal Jules Schick, MD 60 mL/hr at 07/26/21 0255 60 mL/hr at 07/26/21 0255   ??? levETIRAcetam (KEPPRA) 500 mg in sodium chloride 0.9 % 100 mL IVPB  500 mg IntraVENous Once Asem Salma, MD       ??? cosyntropin (CORTROSYN) injection 250 mcg  250 mcg IntraVENous Once Richardson Landry, APRN - CNP       ??? insulin lispro (HUMALOG) injection vial 0-8 Units  0-8 Units SubCUTAneous TID WC Richardson Landry, APRN - CNP   6 Units at 07/25/21 1633   ??? insulin lispro (HUMALOG) injection vial 0-4 Units  0-4 Units  SubCUTAneous Nightly Richardson Landry, APRN - CNP       ??? glucose chewable tablet 16 g  4 tablet Oral PRN Richardson Landry, APRN - CNP       ??? dextrose bolus 10% 125 mL  125 mL IntraVENous PRN Richardson Landry, APRN - CNP        Or   ??? dextrose bolus 10% 250 mL  250 mL IntraVENous PRN Richardson Landry, APRN - CNP       ??? glucagon (rDNA) injection 1 mg  1 mg SubCUTAneous PRN Richardson Landry, APRN - CNP       ??? dextrose 10 % infusion   IntraVENous Continuous PRN Richardson Landry, APRN - CNP       ??? levETIRAcetam (KEPPRA) 500 mg in sodium chloride 0.9 % 100 mL IVPB  500 mg IntraVENous Q12H Hoshimjon J Begmatov, DO   Stopped at 07/25/21 2116   ??? dexamethasone (DECADRON) injection 4 mg  4  mg IntraVENous Q6H Hoshimjon J Begmatov, DO   4 mg at 07/26/21 0256   ??? sodium chloride flush 0.9 % injection 5-40 mL  5-40 mL IntraVENous 2 times per day Christin Charlotta Newton, APRN - CNP   10 mL at 07/25/21 2103   ??? sodium chloride flush 0.9 % injection 5-40 mL  5-40 mL IntraVENous PRN Christin Charlotta Newton, APRN - CNP       ??? 0.9 % sodium chloride infusion   IntraVENous PRN Christin Charlotta Newton, APRN - CNP       ??? ondansetron (ZOFRAN-ODT) disintegrating tablet 4 mg  4 mg Oral Q8H PRN Christin Charlotta Newton, APRN - CNP        Or   ??? ondansetron (ZOFRAN) injection 4 mg  4 mg IntraVENous Q6H PRN Christin Charlotta Newton, APRN - CNP       ??? polyethylene glycol (GLYCOLAX) packet 17 g  17 g Oral Daily PRN Christin Charlotta Newton, APRN - CNP       ??? acetaminophen (TYLENOL) tablet 650 mg  650 mg Oral Q6H PRN Christin Charlotta Newton, APRN - CNP        Or   ??? acetaminophen (TYLENOL) suppository 650 mg  650 mg Rectal Q6H PRN Christin Charlotta Newton, APRN - CNP       ??? lisinopril (PRINIVIL;ZESTRIL) tablet 40 mg  40 mg Oral Daily Christin Charlotta Newton, APRN - CNP   40 mg at 07/25/21 0820   ??? pravastatin (PRAVACHOL) tablet 80 mg  80 mg Oral Daily Christin Charlotta Newton, APRN - CNP   80 mg at  07/25/21 1610       Allergies:  No Known Allergies    Problem List:    Patient Active Problem List   Diagnosis Code   ??? 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 Altered mental status R41.82       Past Medical History:  History reviewed. No pertinent past medical history.    Past Surgical History:  History reviewed. No pertinent surgical history.    Social History:    Social History     Tobacco Use   ??? Smoking status: Never   ??? Smokeless tobacco: Never   Substance Use Topics   ??? Alcohol use: Never                                Counseling given: Not Answered      Vital Signs (Current):   Vitals:    07/26/21 0400 07/26/21 0500 07/26/21 0600 07/26/21 0725   BP: (!) 141/79 (!) 158/77 (!) 159/80 (!) 134/52   Pulse: (!) 42 (!) 47 56 (!) 45   Resp: 14 16 15 15    Temp: 98.2 ??F (36.8 ??C)   (!) 36.8 ??F (2.7 ??C)   TempSrc: Oral   Oral   SpO2: 95% 97% 94% 95%   Weight:                                                  BP Readings from Last 3 Encounters:   07/26/21 (!) 134/52   01/15/21 122/72       NPO Status:  BMI:   Wt Readings from Last 3 Encounters:   07/25/21 258 lb (117 kg)   07/24/21 257 lb (116.6 kg)   07/23/21 257 lb (116.6 kg)     Body mass index is 37.02 kg/m??.    CBC:   Lab Results   Component Value Date/Time    WBC 11.4 07/26/2021 05:40 AM    RBC 4.09 07/26/2021 05:40 AM    HGB 11.7 07/26/2021 05:40 AM    HCT 35.2 07/26/2021 05:40 AM    MCV 86.1 07/26/2021 05:40 AM    RDW 15.0 03/20/2021 08:21 AM    PLT 284 07/26/2021 05:40 AM       CMP:   Lab Results   Component Value Date/Time    NA 144 07/26/2021 05:40 AM    K 4.3 07/26/2021 05:40 AM    CL 112 07/26/2021 05:40 AM    CO2 19 07/26/2021 05:40 AM    BUN 20 07/26/2021 05:40 AM    CREATININE 1.1 07/26/2021 05:40 AM    AGRATIO 1.3 03/20/2021 08:21 AM    LABGLOM >60 07/26/2021 05:40 AM    GLUCOSE 207 07/26/2021 05:40 AM    GLUCOSE 123 03/20/2021 08:21  AM    PROT 6.4 07/24/2021 04:12 AM    CALCIUM 9.0 07/26/2021 05:40 AM    BILITOT 0.3 07/24/2021 04:12 AM    ALKPHOS 93 07/24/2021 04:12 AM    AST 15 07/24/2021 04:12 AM    ALT 16 07/24/2021 04:12 AM       POC Tests:   Recent Labs     07/26/21  0245   POCGLU 211*       Coags: No results found for: PROTIME, INR, APTT    HCG (If Applicable): No results found for: PREGTESTUR, PREGSERUM, HCG, HCGQUANT     ABGs: No results found for: PHART, PO2ART, PCO2ART, HCO3ART, BEART, O2SATART     Type & Screen (If Applicable):  No results found for: LABABO, LABRH    Drug/Infectious Status (If Applicable):  No results found for: HIV, HEPCAB    COVID-19 Screening (If Applicable): No results found for: COVID19        Anesthesia Evaluation  Patient summary reviewed and Nursing notes reviewed no history of anesthetic complications:   Airway: Mallampati: III  TM distance: >3 FB   Neck ROM: full  Mouth opening: > = 3 FB   Dental:          Pulmonary:Negative Pulmonary ROS and normal exam  breath sounds clear to auscultation                             Cardiovascular:Negative CV ROS  Exercise tolerance: good (>4 METS),         ECG reviewed      Echocardiogram reviewed                  Neuro/Psych:                ROS comment: Brain mass GI/Hepatic/Renal:   (+) morbid obesity          Endo/Other: Negative Endo/Other ROS             Pt had no PAT visit       Abdominal:   (+) obese,     Abdomen: soft.      Vascular: negative vascular ROS.         Other Findings:  Anesthesia Plan      general     ASA 3       Induction: intravenous.  arterial line  MIPS: Postoperative ventilation.  Anesthetic plan and risks discussed with patient and spouse.      Plan discussed with CRNA.                    [place], DO   07/26/2021

## 2021-07-26 NOTE — Plan of Care (Signed)
Problem: Discharge Planning  Goal: Discharge to home or other facility with appropriate resources  Outcome: Progressing  Flowsheets (Taken 07/26/2021 0725)  Discharge to home or other facility with appropriate resources:   Identify barriers to discharge with patient and caregiver   Arrange for needed discharge resources and transportation as appropriate   Identify discharge learning needs (meds, wound care, etc)     Problem: Safety - Adult  Goal: Free from fall injury  Outcome: Progressing  Flowsheets (Taken 07/24/2021 0805)  Free From Fall Injury:   Instruct family/caregiver on patient safety   Based on caregiver fall risk screen, instruct family/caregiver to ask for assistance with transferring infant if caregiver noted to have fall risk factors     Problem: Skin/Tissue Integrity  Goal: Absence of new skin breakdown  Description: 1.  Monitor for areas of redness and/or skin breakdown  2.  Assess vascular access sites hourly  3.  Every 4-6 hours minimum:  Change oxygen saturation probe site  4.  Every 4-6 hours:  If on nasal continuous positive airway pressure, respiratory therapy assess nares and determine need for appliance change or resting period.  Outcome: Progressing  Note: Integumentary WNL, no s/s of new skin breakdown     Problem: ABCDS Injury Assessment  Goal: Absence of physical injury  Outcome: Progressing  Flowsheets (Taken 07/24/2021 0805)  Absence of Physical Injury: Implement safety measures based on patient assessment     Problem: Anxiety  Goal: Will report anxiety at manageable levels  Description: INTERVENTIONS:  1. Administer medication as ordered  2. Teach and rehearse alternative coping skills  3. Provide emotional support with 1:1 interaction with staff  Outcome: Progressing  Flowsheets (Taken 07/26/2021 0725)  Will report anxiety at manageable levels:   Administer medication as ordered   Teach and rehearse alternative coping skills

## 2021-07-26 NOTE — Progress Notes (Signed)
Patient arrived to OR with no personal clothing, jewelery, hearing aids, dentures/partials, or glasses.

## 2021-07-26 NOTE — Progress Notes (Signed)
Patient was seen and examined in the pre op area in conjunction with neurosurgery PA Brayton El PA-C).    There are no changes in patient symptoms and neurological exam since yesterday day.  Today I discussed with patient and his  family again today planned/recommended surgical intervention as well as the associated risks and benefit sand alternative.  All questions and concerns were addressed and answered.  Patient elected again to go with today planned surgical intervention.

## 2021-07-26 NOTE — Anesthesia Procedure Notes (Signed)
Arterial Line:    An arterial line was placed using surface landmarks, in the OB for the following indication(s): continuous blood pressure monitoring and blood sampling needed.    A 20 gauge (size), 1 and 1/4 inch (length), Angiocath (type) catheter was placed, Seldinger technique used, into the left radial artery, secured by tape and Tegaderm.  Anesthesia type: General2/02/2022 8:19 AM2/02/2022 8:21 AM  Anesthesiologist: Collene Gobble, DO  Performed: Anesthesiologist   Preanesthetic Checklist  Completed: patient identified, IV checked, site marked, risks and benefits discussed, surgical/procedural consents, equipment checked, pre-op evaluation, timeout performed, anesthesia consent given, oxygen available, monitors applied/VS acknowledged, fire risk safety assessment completed and verbalized and blood product R/B/A discussed and consented

## 2021-07-26 NOTE — Progress Notes (Signed)
1417- pt to pacu, pt opens eyes to name, pt moving left arm and leg, no movements from right side, resp easy and unlabored, VSS, pt appears in no acute distress  1425- Dr. Jonne Ply at bedside to assess, pt without movements on right side, Dr aware  1435- pt with movement and hand grip in right upper extremity  1445- pt with movement in right leg, pt denies numbness and tingling, pt able to tell me name and place, pt appears in no acute distress  1450- Report called to Janett Billow RN on 4B  1500- pt meets criteria for discharge from pacu, pt awaiting inpatient transport to assist nurse to CT and floor  1525- pt transported to CT then floor by transport staff and pacu nurse

## 2021-07-26 NOTE — Anesthesia Post-Procedure Evaluation (Signed)
Department of Anesthesiology  Postprocedure Note    Patient: Marcus Burnett  MRN: [id number]  Birthdate: 05/17/1961  Date of evaluation: 07/26/2021      Procedure Summary     Date: 07/26/21 Room / Location: [facility] OR 12 / [place]    Anesthesia Start: 0800 Anesthesia Stop: 8850    Procedure: RIGHT SIDED CRANIOTOMY FOR 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 (Right: Head) Diagnosis:       Brain mass      (Brain mass [G93.89])    Surgeons: Illene Labrador, MD Responsible Provider: Gildardo Pounds Willett Lefeber, DO    Anesthesia Type: General ASA Status: 3          Anesthesia Type: General    Aldrete Phase I: Aldrete Score: 8    Aldrete Phase II:        Anesthesia Post Evaluation    Patient location during evaluation: PACU  Patient participation: complete - patient participated  Level of consciousness: sleepy but conscious, responsive to verbal stimuli and responsive to light touch  Pain score: 0  Airway patency: patent  Nausea & Vomiting: no nausea and no vomiting  Complications: no  Cardiovascular status: hemodynamically stable and blood pressure returned to baseline  Respiratory status: spontaneous ventilation, acceptable and nasal cannula  Hydration status: stable

## 2021-07-26 NOTE — Progress Notes (Signed)
[facility]  PHYSICAL THERAPY MISSED TREATMENT NOTE  [facility] OR    Date: 07/26/2021  Patient Name: Marcus Burnett        MRN: [id number]   DOB: [birth year]  (61 y.o.)  Gender: male   Referring Practitioner: Richardson Landry, APRN - CNP  Diagnosis: Neoplasm of brain causing mass effect on adjacent structures ([place])         REASON FOR MISSED TREATMENT:  Patient at testing and/or off unit.      Pt off the floor for surgery. Will hold and try back tomorrow.

## 2021-07-26 NOTE — Progress Notes (Signed)
[facility]  OCCUPATIONAL THERAPY MISSED TREATMENT NOTE  [facility] OR  [facility] OR (General) POOL R*      Date: 07/26/2021  Patient Name: Marcus Burnett        CSN: [id number]   DOB: December 02, 1960  (61 y.o.)  Gender: male   Referring Practitioner: Renaldo Fiddler, APRN-CNP  Diagnosis: neoplasm of brain causing mass effect on adjacent structures.         REASON FOR MISSED TREATMENT: Pt. Off floor for surgery this date will try back when next date.
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
## 2021-07-27 ENCOUNTER — Inpatient Hospital Stay: Admit: 2021-07-27 | Payer: BLUE CROSS/BLUE SHIELD | Primary: Family Medicine

## 2021-07-27 LAB — CBC WITH AUTO DIFFERENTIAL
Basophils Absolute: 0 10*3/uL (ref 0.0–0.1)
Basophils: 0.1 %
Eosinophils Absolute: 0 10*3/uL (ref 0.0–0.4)
Eosinophils: 0 %
Hematocrit: 33.2 % — ABNORMAL LOW (ref 42.0–52.0)
Hemoglobin: 10.9 gm/dl — ABNORMAL LOW (ref 14.0–18.0)
Immature Grans (Abs): 0.07 10*3/uL (ref 0.00–0.07)
Immature Granulocytes: 0.7 %
Lymphocytes Absolute: 0.6 10*3/uL — ABNORMAL LOW (ref 1.0–4.8)
Lymphocytes: 5.6 %
MCH: 28.8 pg (ref 26.0–33.0)
MCHC: 32.8 gm/dl (ref 32.2–35.5)
MCV: 87.6 fL (ref 80.0–94.0)
MPV: 10.3 fL (ref 9.4–12.4)
Monocytes Absolute: 0.5 10*3/uL (ref 0.4–1.3)
Monocytes: 4.7 %
Platelets: 257 10*3/uL (ref 130–400)
RBC: 3.79 10*6/uL — ABNORMAL LOW (ref 4.70–6.10)
RDW-CV: 14.2 % (ref 11.5–14.5)
RDW-SD: 45.1 fL — ABNORMAL HIGH (ref 35.0–45.0)
Seg Neutrophils: 88.9 %
Segs Absolute: 8.9 10*3/uL — ABNORMAL HIGH (ref 1.8–7.7)
WBC: 10 10*3/uL (ref 4.8–10.8)
nRBC: 0 /100 wbc

## 2021-07-27 LAB — BASIC METABOLIC PANEL W/ REFLEX TO MG FOR LOW K
BUN: 18 mg/dL (ref 7–22)
CO2: 21 meq/L — ABNORMAL LOW (ref 23–33)
Calcium: 8.4 mg/dL — ABNORMAL LOW (ref 8.5–10.5)
Chloride: 112 meq/L — ABNORMAL HIGH (ref 98–111)
Creatinine: 1.1 mg/dL (ref 0.4–1.2)
Glucose: 192 mg/dL — ABNORMAL HIGH (ref 70–108)
Potassium reflex Magnesium: 4.1 meq/L (ref 3.5–5.2)
Sodium: 143 meq/L (ref 135–145)

## 2021-07-27 LAB — POCT GLUCOSE
POC Glucose: 221 mg/dl — ABNORMAL HIGH (ref 70–108)
POC Glucose: 184 mg/dl — ABNORMAL HIGH (ref 70–108)
POC Glucose: 265 mg/dl — ABNORMAL HIGH (ref 70–108)
POC Glucose: 213 mg/dl — ABNORMAL HIGH (ref 70–108)

## 2021-07-27 LAB — SODIUM
Sodium: 141 meq/L (ref 135–145)
Sodium: 140 meq/L (ref 135–145)
Sodium: 166 meq/L (ref 135–145)
Sodium: 145 meq/L (ref 135–145)
Sodium: 141 meq/L (ref 135–145)
Sodium: 141 meq/L (ref 135–145)

## 2021-07-27 LAB — CORTISOL TOTAL
Cortisol Collection Info: 60
Cortisol: 12.13 ug/dL
Cortisol: 0.34 ug/dL

## 2021-07-27 LAB — GLOMERULAR FILTRATION RATE, ESTIMATED: Est, Glom Filt Rate: >60 mL/min/{1.73_m2} (ref >60)

## 2021-07-27 LAB — ANION GAP: Anion Gap: 10 meq/L (ref 8.0–16.0)

## 2021-07-27 MED ORDER — SODIUM CHLORIDE 0.9 % IV SOLN
0.9 % | INTRAVENOUS | Status: AC | PRN
Start: 2021-07-27 — End: ?
  Administered 2021-07-27: 07:00:00 5 via INTRAVENOUS

## 2021-07-27 MED ORDER — NORMAL SALINE FLUSH 0.9 % IV SOLN
0.9 % | Freq: Two times a day (BID) | INTRAVENOUS | Status: DC
Start: 2021-07-27 — End: 2021-07-26

## 2021-07-27 MED ORDER — MORPHINE SULFATE 4 MG/ML IJ SOLN
4 MG/ML | INTRAMUSCULAR | Status: AC | PRN
Start: 2021-07-27 — End: ?

## 2021-07-27 MED ORDER — MORPHINE SULFATE (PF) 2 MG/ML IV SOLN
2 | INTRAVENOUS | Status: DC | PRN
Start: 2021-07-27 — End: 2021-08-04

## 2021-07-27 MED ORDER — CEFAZOLIN 2000 MG IN NS 50 ML IVPB
Freq: Three times a day (TID) | Status: DC
Start: 2021-07-27 — End: 2021-08-03
  Administered 2021-07-27 – 2021-08-03 (×23): 2000 mg via INTRAVENOUS

## 2021-07-27 MED ORDER — SODIUM CHLORIDE 3 % IV SOLN
3 % | INTRAVENOUS | Status: DC
Start: 2021-07-27 — End: 2021-07-27
  Administered 2021-07-27 (×3): 65 mL/h via INTRAVENOUS

## 2021-07-27 MED ORDER — ATROPINE SULFATE 1 MG/10ML IJ SOSY
1 MG/0ML | INTRAMUSCULAR | Status: AC
Start: 2021-07-27 — End: 2021-07-28

## 2021-07-27 MED ORDER — NORMAL SALINE FLUSH 0.9 % IV SOLN
0.9 % | INTRAVENOUS | Status: AC | PRN
Start: 2021-07-27 — End: ?

## 2021-07-27 MED ORDER — DEXAMETHASONE SODIUM PHOSPHATE 4 MG/ML IJ SOLN
4 MG/ML | Freq: Four times a day (QID) | INTRAMUSCULAR | Status: DC
Start: 2021-07-27 — End: 2021-07-26

## 2021-07-27 MED ORDER — CEFAZOLIN SODIUM-DEXTROSE 2-4 GM/100ML-% IV SOLN
2-4100- GM/100ML-% | Freq: Three times a day (TID) | INTRAVENOUS | Status: AC
Start: 2021-07-27 — End: 2021-07-29

## 2021-07-27 MED ORDER — HYDROCODONE-ACETAMINOPHEN 5-325 MG PO TABS
5-325 | ORAL | Status: DC | PRN
Start: 2021-07-27 — End: 2021-08-04
  Administered 2021-07-31 – 2021-08-02 (×3): 1 via ORAL

## 2021-07-27 MED ORDER — SODIUM CHLORIDE 0.9 % IV SOLN
0.9 % | INTRAVENOUS | Status: DC
Start: 2021-07-27 — End: 2021-07-26

## 2021-07-27 MED FILL — ATROPINE SULFATE 1 MG/10ML IJ SOSY: 1 MG/0ML | INTRAMUSCULAR | Qty: 10

## 2021-07-27 MED FILL — DEXAMETHASONE SODIUM PHOSPHATE 4 MG/ML IJ SOLN: 4 MG/ML | INTRAMUSCULAR | Qty: 1

## 2021-07-27 MED FILL — LEVETIRACETAM 500 MG/5ML IV SOLN: 500 MG/5ML | INTRAVENOUS | Qty: 5

## 2021-07-27 MED FILL — CEFAZOLIN SODIUM-DEXTROSE 2-4 GM/100ML-% IV SOLN: 2-4 GM/100ML-% | INTRAVENOUS | Qty: 100

## 2021-07-27 MED FILL — HUMALOG 100 UNIT/ML IJ SOLN: 100 UNIT/ML | INTRAMUSCULAR | Qty: 4

## 2021-07-27 MED FILL — CEFAZOLIN 2000 MG IN NS 50 ML IVPB: Qty: 50

## 2021-07-27 MED FILL — SODIUM CHLORIDE 3 % IV SOLN: 3 % | INTRAVENOUS | Qty: 500

## 2021-07-27 MED FILL — HUMALOG 100 UNIT/ML IJ SOLN: 100 UNIT/ML | INTRAMUSCULAR | Qty: 2

## 2021-07-27 MED FILL — CEFAZOLIN SODIUM 10 G IJ SOLR: 10 g | INTRAMUSCULAR | Qty: 2

## 2021-07-27 MED FILL — LISINOPRIL 40 MG PO TABS: 40 MG | ORAL | Qty: 1

## 2021-07-27 MED FILL — COSYNTROPIN 0.25 MG IJ SOLR: 0.25 MG | INTRAMUSCULAR | Qty: 250

## 2021-07-27 MED FILL — PRAVASTATIN SODIUM 80 MG PO TABS: 80 MG | ORAL | Qty: 1

## 2021-07-27 NOTE — Care Coordination-Inpatient (Signed)
07/27/21, 1:51 PM EST    DISCHARGE ON GOING EVALUATION    [facility] day: 4  Location: 4B-01/001-A Reason for admit: Neoplasm of brain causing mass effect on adjacent structures ([place]) [D49.6, G93.5]  Altered mental status, unspecified altered mental status type [R41.82]   Procedure:   2/6 CT Head: A left frontal lobe mass and a left parietotemporal lobe mass are noted as described above. There is midline shift to the left at the level of the septum pellucidum related to subfalcine herniation. Differential diagnoses would include metastatic disease, glioblastoma multiforme, abscess amongst other possibilities  2/6 CT Chest: Bilateral pulmonary nodules are noted. 1 of the nodules which is located in the left lower lobe appears larger compared to the prior study and measures 5-6 mm. Previously it measured 3 mm. Although this could be   due at least in part to differences in slice selection, neoplastic disease is questioned. Short-term follow-up is advised  2/6 CT Abd/pelvis: A few subcentimeter enhancing lesions are noted within the liver, these are better seen on the CT chest study performed on the same day. Neoplastic disease will need to be excluded. Follow-up nonurgent liver   MRI, without and with contrast, is advised; A subcentimeter sclerotic lesion is noted within the L2 vertebral body   which is new since the prior study. This is also suspicious for neoplastic disease.  2/6 CXR: No acute process  2/7 MRI Brain: Lobulated intra-axial 3 masses and associated blood products consistent with hemorrhage. This is most consistent with metastatic disease; Surrounding vasogenic edema with slight midline shift to the left; Mild severity chronic small vessel ischemic changes.  2/7 MRI Abdomen: No abnormal enhancing hepatic mass is seen; Status post left nephrectomy. No residual or recurrent disease in the surgical bed  2/7 Echo with EF 60%  2/7 PICC right basilic  2/8 MRI Lumbar spine: 8 mm  indeterminate lesion in L2. This could represent a small metastasis. Other etiologies are not excluded; No evidence of tumor within the spinal canal; Moderate severity right and mild left foraminal stenosis at the L5-S1 level due to degenerative changes  2/8 CT Head: Preoperative planning for the hemorrhagic masses in the left frontal lobe, right temporal lobe and right occipital lobe  2/9 RIGHT SIDED CRANIOTOMY FOR RESECTION OF OCCIPITAL TUMOR AND OCCIPITAL TEMPORAL TUMOR  2/9 CT Head: represent metastases from renal carcinoma; Large mass in the left frontal lobe measuring 3.6 x 3.3 cm in size with hemorrhage and surrounding edema; Area of encephalomalacia in the right frontal cortex; Probable ischemic changes in the white matter  2/10 CT Head: Stable    Barriers to Discharge: POD #1. Tumor resection and biopsy done yesterday. Plan for additional resection of left brain mass on Tuesday 2/14. Continues on 3% saline drip with Q4H Na+ levels.     Afebrile. SB 50's. On room air. Ox4. Delayed responses. Follows commands. NIH 2: facial palsy, best language. JP x1. SLP/PT/OT. Seizure precautions, PICC, n/v checks, drain care, SCDs. 3% saline @ 65 ml/hr, IV ancef Q8H, IV decadron 4 mg Q6H, SSI, IV keppra, lisinopril, pravastatin.     PCP: [name], MD  Readmission Risk Score: 12%  Patient Goals/Plan/Treatment Preferences: From home w/wife. Plan pending clinical course; monitor therapy evals for recommendations. Plan for another surgery on Tuesday 2/14.

## 2021-07-27 NOTE — Progress Notes (Signed)
[facility]  INPATIENT OCCUPATIONAL THERAPY  [facility] CVICU 4B  RE-EVALUATION    Time:    Time In: 0815  Time Out: 0847  Timed Code Treatment Minutes: 23 Minutes  Minutes: 32          Date: 07/27/2021  Patient Name: Marcus Burnett,   Gender: male      MRN: [id number]  DOB: 06-14-1961  (61 y.o.)  Referring Practitioner: Renaldo Fiddler, APRN-CNP  Diagnosis: neoplasm of brain causing mass effect on adjacent structures.  Additional Pertinent Hx: per chart review; "Skai Farar is a 61 y.o. male who presents for evaluation. Apparently for the past 6 weeks patient has had a decline in his mental health and cognitive skills.Patient worked at [employer] and had a very stressful job.  They both attributed development of depression to job stress.  His behavior changed and slowed. His gait is different and he has blank stare on his face at times.  Patient has trouble communicating his thoughts. 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  Wife was told that "they got everything."  Her and her husband were aware that this type of cancer could spread to lung and brain however they were not informed that there was any metastasis. RIGHT SIDED CRANIOTOMY FOR RESECTION OF OCCIPITAL TUMOR AND OCCIPITAL TEMPORAL TUMOR on 07/26/21    Restrictions/Precautions:  Restrictions/Precautions: General Precautions, Fall Risk, Surgical Protocols    Subjective  Chart Reviewed: Yes, Orders, Progress Notes, History and Physical, Imaging, Operative Notes  Patient assessed for rehabilitation services?: Yes  Family / Caregiver Present: No    Subjective: RN approved session, patient supine in bed with head of bed elevated upon OT arrival and agreeable to eval. patient A & O x 3.    Pain: Pt c/o minimal H/A, does not worsen with activity.     Vitals: Blood Pressure: Arterial BP 136/70; did decrease to 108/68 with functional mobility. Pt with slight c/o dizziness.   Oxygen: 96% on RA  Heart Rate: 52  bpm    Social/Functional History:  Lives With: Spouse, Other (comment) (mother in law)  Type of Home: House  Home Layout: One level  Home Access: Stairs to enter with rails  Entrance Stairs - Number of Steps: 1   Bathroom Shower/Tub: Pension scheme manager: Standard  Bathroom Accessibility: Accessible    Receives Help From: Family  ADL Assistance: Independent  Homemaking Assistance: Independent  Ambulation Assistance: Independent  Transfer Assistance: Independent    Active Driver: Yes  Occupation: Unemployed  Additional Comments: patient's spouse is a Lawyer, however main purpose of moving to [place] was for wife to take care of her mother.    VISION:Corrected, does not currently have glasses here    HEARING:  WFL    COGNITION: Decreased Insight, Decreased Safety Awareness, and Impulsive  **Easily redirectable    RANGE OF MOTION:  Bilateral Upper Extremity:  WFL    STRENGTH:  Bilateral Upper Extremity:  WFL    ADL:   Footwear Management: Moderate Assistance.  socks .    BALANCE:  Sitting Balance:  Stand By Assistance.    Standing Balance: Geophysicist/field seismologist. Standing for ~6 minutes X2 trials    BED MOBILITY:  Supine to Sit: Minimal Assistance, with head of bed raised, with verbal cues , with increased time for completion ; minimal cues to avoid pushing through LUE due to arterial line  Scooting: Stand By Assistance  TRANSFERS:  Sit to Stand:  ONEOK, with increased time for completion, cues for hand placement.    Stand to Sit: Minimal Assistance, with increased time for completion, cues for hand placement. . Assist for slow descent into chair  **Completed X3 trials during session    FUNCTIONAL MOBILITY:  Assistive Device: None (occasionally use IV pole for support)  Assist Level:  Contact Guard Assistance.   Distance:  short distance in room  Grossly steady, cues for slower pace due to multiple lines.      Activity Tolerance:  Patient tolerance of  treatment: good.         Assessment:   Pt presents requiring increased assistance for ADLs, transfers, and functional mobility compared to PLOF. Pt will continue to benefit from OT services to improve independence with these tasks, in addition to overall strength/endurance to facilitate return to PLOF.     Performance deficits / Impairments: Decreased functional mobility , Decreased ADL status, Decreased endurance, Decreased strength, Decreased coordination, Decreased balance, Decreased safe awareness, Decreased cognition  Prognosis: Good  REQUIRES OT FOLLOW-UP: Yes  Decision Making: Medium Complexity    Treatment Initiated: Treatment and education initiated within context of evaluation.  Evaluation time included review of current medical information, gathering information related to past medical, social and functional history, completion of standardized testing, formal and informal observation of tasks, assessment of data and development of plan of care and goals.  Treatment time included skilled education and facilitation of tasks to increase safety and independence with ADL's for improved functional independence and quality of life.    Discharge Recommendations:  Home with 24/7 assist; [facility] OT    Patient Education:     Patient Education  Education Given To: Patient  Education Provided: Role of Therapy, Teacher, early years/pre, Plan of Care, Fall Prevention Strategies, Precautions, ADL Adaptive Strategies  Barriers to Learning: Cognition    Equipment Recommendations:  Equipment Needed: No    Plan:  Times Per Week: 5x  Times Per Day: Once a day  Current Treatment Recommendations: Strengthening, Balance training, Functional mobility training, Endurance training, Safety education & training, Neuromuscular re-education, Patient/Caregiver education & training, Self-Care / ADL, Coordination training.  See long-term goal time frame for expected duration of plan of care.  If no long-term goals established, a short length of stay is  anticipated.    Goals:  Patient goals : [facility]  Short Term Goals  Time Frame for Short Term Goals: by discharge  Short Term Goal 1: patient will tolerate standing >10 minutes with supervision in prep for ADL tasks.  Short Term Goal 2: patient will functionally ambulate house hold distances with (S) with 0-1 cues for safety and sequencing.  Short Term Goal 3: patient will complete ADL routine with (S) with 0-1 cues for safety awareness.  Short Term Goal 4: patient will participate in moderate resistive UB Exer to increase UB strength for functional transfers.         Following session, patient left in safe position with all fall risk precautions in place.

## 2021-07-27 NOTE — Plan of Care (Signed)
Pts art-line positional, tubing and dressing changed in attempts to preserve. Pt up walking with PT/OT and a little unsteady. Awaiting OR Tuesday, will continue to monitor and report changes  Problem: Discharge Planning  Goal: Discharge to home or other facility with appropriate resources  Outcome: Progressing     Problem: Safety - Adult  Goal: Free from fall injury  Outcome: Progressing  Flowsheets (Taken 07/27/2021 1320)  Free From Fall Injury: Instruct family/caregiver on patient safety     Problem: Skin/Tissue Integrity  Goal: Absence of new skin breakdown  Description: 1.  Monitor for areas of redness and/or skin breakdown  2.  Assess vascular access sites hourly  3.  Every 4-6 hours minimum:  Change oxygen saturation probe site  4.  Every 4-6 hours:  If on nasal continuous positive airway pressure, respiratory therapy assess nares and determine need for appliance change or resting period.  Outcome: Progressing     Problem: ABCDS Injury Assessment  Goal: Absence of physical injury  Outcome: Progressing  Flowsheets (Taken 07/27/2021 1320)  Absence of Physical Injury: Implement safety measures based on patient assessment     Problem: Anxiety  Goal: Will report anxiety at manageable levels  Description: INTERVENTIONS:  1. Administer medication as ordered  2. Teach and rehearse alternative coping skills  3. Provide emotional support with 1:1 interaction with staff  Outcome: Progressing  Flowsheets (Taken 07/27/2021 1000)  Will report anxiety at manageable levels: Administer medication as ordered
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
## 2021-07-27 NOTE — Progress Notes (Signed)
Neurosurgery Progress Note    Patient:  Marcus Burnett      Unit/Bed:4B-01/001-A    Date of Birth: 07/26/1960    MRN: [id number]     Acct: [id number]     Admit date: 07/23/2021    Chief Complaint   Patient presents with    Depression    Altered Mental Status       Patient Seen, Chart, Physician notes, Labs, Radiology studies reviewed.    Subjective: Seen and evaluated in the ICU setting postoperative day #1 from occipital craniotomy and resection of intercranial lesions as performed by Dr. Gardenia Phlegm, without complication.  He was sitting up in a chair this morning comfortable with pain well controlled        Past, Family, Social History unchanged from admission.    Diet:  ADULT DIET; Regular; 4 carb choices (60 gm/meal); Low Fat/Low Chol/High Fiber/2 gm Na; 1500 ml    Medications:  Scheduled Meds:   ceFAZolin  2,000 mg IntraVENous Q8H    cosyntropin  250 mcg IntraVENous Once    insulin lispro  0-8 Units SubCUTAneous TID WC    insulin lispro  0-4 Units SubCUTAneous Nightly    levETIRAcetam  500 mg IntraVENous Q12H    dexamethasone  4 mg IntraVENous Q6H    sodium chloride flush  5-40 mL IntraVENous 2 times per day    lisinopril  40 mg Oral Daily    pravastatin  80 mg Oral Daily     Continuous Infusions:   sodium chloride 65 mL/hr (07/27/21 1316)    sodium chloride 10 mL/hr at 07/27/21 1316    dextrose       PRN Meds:sodium chloride flush, sodium chloride, morphine **OR** morphine, HYDROcodone 5 mg - acetaminophen, glucose, dextrose bolus **OR** dextrose bolus, glucagon (rDNA), dextrose, sodium chloride flush, ondansetron **OR** ondansetron, polyethylene glycol    Objective: Observed this morning sitting up in a chair comfortably with pain well controlled.  Dressings are intact over flat dry incision and the surgical drain was intact and functioning with approximately 50 cc of output noted.  He presented with clear appropriate speech and participated in discussions involving his care appropriately this morning.   Remains otherwise stable and intact on exam    Vitals: BP (!) 152/80    Pulse 53    Temp 98 ??F (36.7 ??C) (Oral)    Resp 18    Wt 258 lb (117 kg)    SpO2 100%    BMI 37.02 kg/m??     Physical Exam     Physical Exam:  Alert and attentive.  Language appropriate, with no aphasia.  Pupils equal.  Facial strength symmetric.    Review of Systems     Constitutional:  Negative for fever.   HENT:  Negative for congestion.    Eyes:  Positive for visual disturbance.   Respiratory:  Negative for apnea.    Cardiovascular:  Negative for chest pain.   Gastrointestinal:  Negative for abdominal distention.   Genitourinary:  Negative for difficulty urinating.   Musculoskeletal:  Negative for back pain.   Neurological:  Positive for weakness.   Psychiatric/Behavioral:  Positive for confusion.      24 hour intake/output:  Intake/Output Summary (Last 24 hours) at 07/27/2021 1337  Last data filed at 07/27/2021 1316  Gross per 24 hour   Intake 5902.58 ml   Output 3150 ml   Net 2752.58 ml     Last 3 weights:  Wt Readings from Last 3 Encounters:  07/25/21 258 lb (117 kg)   07/24/21 257 lb (116.6 kg)   07/23/21 257 lb (116.6 kg)         CBC:   Recent Labs     07/25/21  0540 07/26/21  0540 07/27/21  0536   WBC 12.2* 11.4* 10.0   HGB 12.3* 11.7* 10.9*   PLT 278 284 257     BMP:    Recent Labs     07/25/21  0540 07/25/21  0753 07/26/21  0540 07/26/21  0935 07/26/21  1500 07/27/21  0536 07/27/21  0827 07/27/21  0913   NA 140   < > 144 145   < > 143 166* 145   K 4.2  --  4.3 4.3  --  4.1  --   --    CL 109  --  112*  --   --  112*  --   --    CO2 19*  --  19*  --   --  21*  --   --    BUN 18  --  20  --   --  18  --   --    CREATININE 1.2  --  1.1  --   --  1.1  --   --    GLUCOSE 187*  --  207*  --   --  192*  --   --     < > = values in this interval not displayed.     Calcium:  Recent Labs     07/27/21  0536   CALCIUM 8.4*     Magnesium:  Recent Labs     07/25/21  0540   MG 1.9     Glucose:  Recent Labs     07/26/21  2119 07/27/21  0818  07/27/21  1213   POCGLU 221* 184* 265*     HgbA1C: No results for input(s): LABA1C in the last 72 hours.  Lipids: No results for input(s): CHOL, TRIG, HDL, LDL, LDLCALC in the last 72 hours.    Radiology reports as per the Radiologist  Radiology: CT HEAD WO CONTRAST    Result Date: 07/23/2021  PROCEDURE: CT HEAD WO CONTRAST CLINICAL INFORMATION:AMS COMPARISON: None TECHNIQUE: 5 mm axial imaging through the head without IV contrast. All CT scans at this facility use dose modulation, iterative reconstruction, and/or weight based dosing when appropriate to reduce the radiation dose to as low as reasonably achievable. FINDINGS: A 4.7 x 2.1 cm mass in the left frontal lobe with vasogenic edema. There is a 5.4 x 3.1 cm mass in the right parietotemporal lobe with surrounding basal genic edema. There is mass effect. There is midline shift to the left 6 mm at the level of the septum  pellucidum. A hypodense region in the inferior right frontal lobe is noted that likely relates to underlying edematous change. Mild cerebral volume loss. No ventriculomegaly.  No acute intracranial hemorrhage. No intracranial collection. The posterior fossa is unremarkable. The craniocervical junction is unremarkable. No acute bony abnormality. Mild mucosal thickening of the maxillary sinuses, the ethmoid sinuses. Polyp or mucous retention cyst is seen in the right frontal sinus. Otherwise, the remaining paranasal sinuses are clear. The  mastoid air cells are clear. The orbits are unremarkable.     1. A left frontal lobe mass and a left parietotemporal lobe mass are noted as described above. There is midline shift to the left at the level of the septum pellucidum related to subfalcine herniation.  Differential diagnoses would include metastatic disease, glioblastoma multiforme, abscess amongst other possibilities. The critical  finding(s) was called to Francisca December PA-C at 1807 hours on 07/23/2021 by Dr. Bobbye Charleston. Verbal acknowledgment and  readback was given. **This report has been created using voice recognition software.  It may contain minor errors which are inherent in voice recognition technology.** Final report electronically signed by Dr Varney Biles on 07/23/2021 6:07 PM    CT CHEST W CONTRAST    Result Date: 07/23/2021  CT CHEST WITH CONTRAST COMPARISON: 03/21/2021. FINDINGS: There are no acute infiltrates, consolidations, or pleural effusions. A nodule in the right lower lobe measures 5-6 mm on axial image 32. This does not appear to be significantly changed. Tiny 2 mm nodule in the right middle lobe on axial image 37 does not appear to be significantly changed. 5-6 mm nodule in the right lower lobe on axial image 47 does not appear to be significantly changed. Multiple small nodules are noted within the left lower lobe. 1 of the nodules, seen on axial image 37, appears larger compared to the prior study and measures 5-6 mm on axial image 37. Previously it measured approximately 3 mm. There is no adenopathy. Cardiac size is upper limits of normal. Thoracic aorta is partially obscured by motion/streak artifact but is normal in caliber without evidence of an aneurysm. Ovoid 9 mm nodule is suspected in the right thyroid lobe on axial image 8, this is partially obscured by streak artifact but does not appear to be significantly changed. The bone windows demonstrate no acute abnormalities. Old healed fracture is noted involving the right seventh rib. The CT abdomen/pelvis study will be reported separately.     1. Bilateral pulmonary nodules are noted. 1 of the nodules which is located in the left lower lobe appears larger compared to the prior study and measures 5-6 mm. Previously it measured 3 mm. Although this could be due at least in part to differences in slice selection, neoplastic disease is questioned. Short-term follow-up is advised. 2. Additional findings are detailed above. This document has been electronically signed by: Isaias Sakai,  M.D. on 07/23/2021 10:53 PM All CTs at this facility use dose modulation techniques and iterative reconstructions, and/or weight-based dosing when appropriate to reduce radiation to a low as reasonably achievable.    MRI LUMBAR SPINE W WO CONTRAST    Result Date: 07/25/2021  PROCEDURE: MRI LUMBAR SPINE W WO CONTRAST CLINICAL INFORMATION: evaluate L2 vertebral body neoplastic disease, history of RCC, new intracranial tumors. COMPARISON: CT abdomen and pelvis 07/23/2021. TECHNIQUE: Sagittal and axial T1 and T2-weighted images were obtained to the lumbar spine. Postcontrast axial and sagittal T1-weighted images were also obtained. FINDINGS: The lumbar vertebral bodies are normally aligned.  There is a 8 mm small indeterminate lesion in L2. This corresponds to the finding on CT. There does appear to be some central enhancement. This could represent a small metastasis but is indeterminate. There are no other bone lesions. There is some edema associated with facet degenerative changes at the L4-5 level. There is some associated enhancement which appears degenerative in nature.   There are no compression fractures.  No pars defects are noted.  There is mild disc desiccation at the L3-4 and L5-S1 levels.  The visualized aspects of the distal spinal cord are normal. The nerve roots of the cauda equina and the tip of the conus are normal. There are no gross abnormalities in the distal thoracic spine. On the axial images, at T12-L1 through L2-3,  there are no degenerative changes. There is no spinal canal or foraminal stenosis. At L3-4, there are mild facet degenerative changes. There is no spinal canal stenosis. There is no foraminal stenosis. At L4-5, there are mild facet degenerative changes. There is no focal disc abnormality. There is no spinal canal stenosis. There is no foraminal stenosis. At L5-S1, there are mild facet degenerative changes. There is a diffuse disc bulge. There is mild narrowing of the left subarticular  recess. There is no spinal canal stenosis. There is moderate severity right and mild left foraminal stenosis. There is no abnormal enhancement within the spinal canal. There are no suspicious findings in the visualized aspects of the retroperitoneum and paraspinal soft tissues.      1. 8 mm indeterminate lesion in L2. This could represent a small metastasis. Other etiologies are not excluded. 2. No evidence of tumor within the spinal canal. 3. Moderate severity right and mild left foraminal stenosis at the L5-S1 level due to degenerative changes. **This report has been created using voice recognition software. It may contain minor errors which are inherent in voice recognition technology.** Final report electronically signed by Dr. Carmie End on 07/25/2021 6:06 AM    MRI ABDOMEN W WO CONTRAST    Result Date: 07/24/2021  PROCEDURE: MRI ABDOMEN W WO CONTRAST CLINICAL INFORMATION: evaluate liver enhancing lesion, history of RCC, new intracranial tumors COMPARISON: CT abdomen and pelvis 07/23/2021 TECHNIQUE: Multisequence and multiplanar MRI of the abdomen was performed without and with  contrast. T1 and T2 contrast information were emphasized. CONTRAST: 20  mL of ProHance  intravenously. FINDINGS: Motion artifacts degrade images. LOWER THORAX: No significant lower thoracic findings. HEPATOBILIARY: No focal hepatic mass.  Unremarkable hepatic surface morphology. The gallbladder, and biliary tree are unremarkable. PANCREAS AND SPLEEN: The pancreas is markedly atrophic. No peripancreatic abnormality.  The spleen is not enlarged RETROPERITONEUM: The left kidney is surgically absent. No residual mass is seen in the postoperative bed. The left adrenal gland is also surgically absent. The right adrenal gland is unremarkable. No hydronephrosis. Tiny right renal cyst is seen. BOWEL AND PERITONEUM:  No bowel obstruction or acute inflammatory bowel process.  VASCULAR: The abdominal aorta is not aneurysmal. The main portal vein,  splenic vein and superior mesenteric vein are patent. LYMPH NODES: No significantly enlarged lymph nodes are seen. BONES: No acute bony abnormality. OTHER: None.     1. No abnormal enhancing hepatic mass is seen. 2. Status post left nephrectomy. No residual or recurrent disease in the surgical bed. **This report has been created using voice recognition software.  It may contain minor errors which are inherent in voice recognition technology.** Final report electronically signed by Dr Varney Biles on 07/24/2021 9:21 PM    CT ABDOMEN PELVIS W IV CONTRAST Additional Contrast? None    Result Date: 07/23/2021  CT ABDOMEN AND PELVIS WITH CONTRAST COMPARISON: 02/27/2021. FINDINGS: A few enhancing lesions are noted within the liver. These lesions are better seen on the CT chest study, for example located in the right hepatic lobe, measuring 7-8 mm on axial image 46. Another faint enhancing lesion located more inferiorly in the right hepatic lobe measures 6-7 mm on axial image 58. The gallbladder is unremarkable. The stomach is significantly underdistended which precludes accurate assessment. A small hiatal hernia is noted. The pancreas and spleen are unremarkable. Right adrenal gland is unremarkable. Subcentimeter low-attenuation lesion is again noted within the right kidney which is too small to characterize. A subcentimeter stone is noted in the  upper pole of the right kidney, without hydronephrosis. Postoperative changes of a prior left-sided nephrectomy are again noted. Abdominal aorta is normal in caliber, without evidence of an aneurysm or dissection. 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 There is no evidence of a small bowel obstruction or free air. There is no pericolonic inflammation. Urinary bladder is underdistended. Fat-containing inguinal hernias are again noted. The bone windows demonstrate a subcentimeter sclerotic lesion within the L2 vertebral body  level on coronal image 44, this is new since the prior study. Chronic mild loss of height of the L5 vertebral body level is again noted.     1. A few subcentimeter enhancing lesions are noted within the liver, these are better seen on the CT chest study performed on the same day. Neoplastic disease will need to be excluded. Follow-up nonurgent liver MRI, without and with contrast, is advised. 2. A subcentimeter sclerotic lesion is noted within the L2 vertebral body which is new since the prior study. This is also suspicious for neoplastic disease. 3. Additional findings are detailed above. This document has been electronically signed by: Isaias Sakai, M.D. on 07/23/2021 11:08 PM All CTs at this facility use dose modulation techniques and iterative reconstructions, and/or weight-based dosing when appropriate to reduce radiation to a low as reasonably achievable.    XR CHEST PORTABLE    Result Date: 07/23/2021  PROCEDURE: XR CHEST PORTABLE CLINICAL INFORMATION: cough COMPARISON: None TECHNIQUE: AP portable chest radiograph performed. FINDINGS: No focal pulmonary consolidation. Cardiac silhouette is not enlarged. No pleural effusion. No pneumothorax. No acute bony abnormality.     1. No acute cardiopulmonary finding. **This report has been created using voice recognition software.  It may contain minor errors which are inherent in voice recognition technology.** Final report electronically signed by Dr Varney Biles on 07/23/2021 6:26 PM    MRI BRAIN W WO CONTRAST    Result Date: 07/24/2021  PROCEDURE: MRI BRAIN W [place] INFORMATIONEvaluate brain massesaltered mental status. COMPARISON: Head CT 07/23/2021. TECHNIQUE: Multiplanar and multiple spin echo T1 and T2-weighted images were obtained through the brain before and after the administration of intravenous contrast. Postcontrast images were obtained according to the [facility] protocol. FINDINGS: In the posterior left frontal lobe there is a 4.4 x 4.3 x 3.2 cm  heterogeneously enhancing intra-axial mass. This has lobulated margins. There is some central necrosis. There is some associated susceptibility artifact consistent with some associated blood products. In the right temporal occipital location, there is a 4.3 x 3.5 x 4.1 cm intra-axial mass. This has thick heterogeneous enhancement and lobulated margins. There are associated blood products. In the inferior right occipital lobe there is a lobulated intra-axial mass versus 2 adjacent masses which abut one another. This measures 1.7 x 1.6 x 1.1 cm. This has lobulated margins and relatively uniform enhancement. There are associated blood products. On the FLAIR sequence, there is a moderate amount of abnormal signal surrounding each of the masses consistent with surrounding vasogenic edema. On the right, this involves portions of the occipital lobe, temporal lobe and extends into the right basal ganglia. This also extends into the right parietal lobe. On the left, this extends throughout the left frontal lobe. There is no restricted diffusion to suggest an acute infarct. The brain volume is mildly reduced.. There are no extra-axial collections.  There is mass effect upon the temporal horn and occipital horn the right lateral ventricle. There is some mild mass effect upon the  body of the left lateral ventricle. There is slight midline shift to the left. There is no hydrocephalus. On the FLAIR and T2-weighted sequences, there is mild signal hyperintensity scattered in the white matter of the brain. This is most consistent with mild severity chronic small vessel ischemic changes. On the gradient echo T2-weighted images, there is susceptibility artifact associated with each of the intra-axial masses consistent with blood products.  The major intracranial vascular flow voids are present.  The midline craniocervical junction structures are normal.  The brainstem and pituitary gland are normal. The midline frontal sinus is  opacified. There is mucosal thickening in the mastoid air cells.      1. Lobulated intra-axial 3 masses and associated blood products consistent with hemorrhage. This is most consistent with metastatic disease. 2. Surrounding vasogenic edema with slight midline shift to the left. 3. Mild severity chronic small vessel ischemic changes. **This report has been created using voice recognition software. It may contain minor errors which are inherent in voice recognition technology.** Final report electronically signed by Dr. 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 *        Plan: Patient is seen and evaluated in the ICU setting postoperative day 1 from a subtotal craniotomy and safe maximal resection of occipital brain lesions as performed by Dr. Gardenia Phlegm, without complication.  Patient is observed sitting in a chair comfortably with pain well controlled.  Dressings are intact over flat dry incision and the surgical drain was intact and functioning approximately 50 cc per shift.  Patient remains otherwise stable and intact neurologically to his baseline with no additional significant changes noted.  Surgery anticipates additional surgical intervention resection of remaining intracranial lesion, tentatively for Tuesday.  In advance of that procedure a new MRI of the brain will be ordered with and without contrast utilizing [facility] protocols for neuro navigation.  Will be placed n.p.o. midnight Monday in anticipation of the procedure.  Neurosurgery to follow    Electronically signed by Brayton El, PA-C on 07/27/2021 at 1:37 PM    Neurosurgery
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
## 2021-07-28 LAB — POCT GLUCOSE
POC Glucose: 231 mg/dl — ABNORMAL HIGH (ref 70–108)
POC Glucose: 234 mg/dl — ABNORMAL HIGH (ref 70–108)
POC Glucose: 198 mg/dl — ABNORMAL HIGH (ref 70–108)
POC Glucose: 245 mg/dl — ABNORMAL HIGH (ref 70–108)

## 2021-07-28 LAB — BASIC METABOLIC PANEL W/ REFLEX TO MG FOR LOW K
BUN: 19 mg/dL (ref 7–22)
CO2: 20 meq/L — ABNORMAL LOW (ref 23–33)
Calcium: 8.5 mg/dL (ref 8.5–10.5)
Chloride: 115 meq/L — ABNORMAL HIGH (ref 98–111)
Creatinine: 1 mg/dL (ref 0.4–1.2)
Glucose: 227 mg/dL — ABNORMAL HIGH (ref 70–108)
Potassium reflex Magnesium: 4.2 meq/L (ref 3.5–5.2)
Sodium: 145 meq/L (ref 135–145)

## 2021-07-28 LAB — CBC WITH AUTO DIFFERENTIAL
Basophils Absolute: 0 10*3/uL (ref 0.0–0.1)
Basophils: 0.1 %
Eosinophils Absolute: 0 10*3/uL (ref 0.0–0.4)
Eosinophils: 0 %
Hematocrit: 32.8 % — ABNORMAL LOW (ref 42.0–52.0)
Hemoglobin: 10.7 gm/dl — ABNORMAL LOW (ref 14.0–18.0)
Immature Grans (Abs): 0.11 10*3/uL — ABNORMAL HIGH (ref 0.00–0.07)
Immature Granulocytes: 1.2 %
Lymphocytes Absolute: 0.7 10*3/uL — ABNORMAL LOW (ref 1.0–4.8)
Lymphocytes: 8 %
MCH: 28.3 pg (ref 26.0–33.0)
MCHC: 32.6 gm/dl (ref 32.2–35.5)
MCV: 86.8 fL (ref 80.0–94.0)
MPV: 10.8 fL (ref 9.4–12.4)
Monocytes Absolute: 0.4 10*3/uL (ref 0.4–1.3)
Monocytes: 4.4 %
Platelets: 243 10*3/uL (ref 130–400)
RBC: 3.78 10*6/uL — ABNORMAL LOW (ref 4.70–6.10)
RDW-CV: 14.1 % (ref 11.5–14.5)
RDW-SD: 44.2 fL (ref 35.0–45.0)
Seg Neutrophils: 86.3 %
Segs Absolute: 7.8 10*3/uL — ABNORMAL HIGH (ref 1.8–7.7)
WBC: 9 10*3/uL (ref 4.8–10.8)
nRBC: 0 /100 wbc

## 2021-07-28 LAB — SODIUM
Sodium: 143 meq/L (ref 135–145)
Sodium: 143 meq/L (ref 135–145)
Sodium: 144 meq/L (ref 135–145)
Sodium: 143 meq/L (ref 135–145)

## 2021-07-28 LAB — ANION GAP: Anion Gap: 10 meq/L (ref 8.0–16.0)

## 2021-07-28 LAB — GLOMERULAR FILTRATION RATE, ESTIMATED: Est, Glom Filt Rate: >60 mL/min/{1.73_m2} (ref >60)

## 2021-07-28 MED ORDER — SODIUM CHLORIDE 3 % IV SOLN
3 % | INTRAVENOUS | Status: DC
Start: 2021-07-28 — End: 2021-08-02
  Administered 2021-07-28 – 2021-08-02 (×18): 75 mL/h via INTRAVENOUS

## 2021-07-28 MED ORDER — LEVETIRACETAM 500 MG PO TABS
500 | Freq: Two times a day (BID) | ORAL | Status: DC
Start: 2021-07-28 — End: 2021-08-04
  Administered 2021-07-29 – 2021-08-04 (×13): 500 mg via ORAL

## 2021-07-28 MED ORDER — GADOTERIDOL 279.3 MG/ML IV SOLN
279.3 MG/ML | Freq: Once | INTRAVENOUS | Status: AC | PRN
Start: 2021-07-28 — End: 2021-07-27
  Administered 2021-07-28: 20 mL via INTRAVENOUS

## 2021-07-28 MED ORDER — INSULIN GLARGINE 100 UNIT/ML SC SOLN
100 UNIT/ML | Freq: Every evening | SUBCUTANEOUS | Status: AC
Start: 2021-07-28 — End: 2021-07-29
  Administered 2021-07-29: 02:00:00 5 [IU] via SUBCUTANEOUS

## 2021-07-28 MED ORDER — ENOXAPARIN SODIUM 30 MG/0.3ML IJ SOSY
300.3 MG/0.3ML | Freq: Two times a day (BID) | INTRAMUSCULAR | Status: AC
Start: 2021-07-28 — End: 2021-07-29
  Administered 2021-07-28 – 2021-07-30 (×4): 30 mg via SUBCUTANEOUS

## 2021-07-28 MED ORDER — AMLODIPINE BESYLATE 5 MG PO TABS
5 MG | Freq: Every day | ORAL | Status: AC
Start: 2021-07-28 — End: 2021-07-30
  Administered 2021-07-28 – 2021-07-29 (×2): 5 mg via ORAL

## 2021-07-28 MED FILL — HUMALOG 100 UNIT/ML IJ SOLN: 100 UNIT/ML | INTRAMUSCULAR | Qty: 2

## 2021-07-28 MED FILL — DEXAMETHASONE SODIUM PHOSPHATE 4 MG/ML IJ SOLN: 4 MG/ML | INTRAMUSCULAR | Qty: 1

## 2021-07-28 MED FILL — CEFAZOLIN SODIUM 10 G IJ SOLR: 10 g | INTRAMUSCULAR | Qty: 2

## 2021-07-28 MED FILL — PRAVASTATIN SODIUM 80 MG PO TABS: 80 MG | ORAL | Qty: 1

## 2021-07-28 MED FILL — SODIUM CHLORIDE 3 % IV SOLN: 3 % | INTRAVENOUS | Qty: 500

## 2021-07-28 MED FILL — LEVETIRACETAM 500 MG/5ML IV SOLN: 500 MG/5ML | INTRAVENOUS | Qty: 5

## 2021-07-28 MED FILL — LANTUS 100 UNIT/ML SC SOLN: 100 UNIT/ML | SUBCUTANEOUS | Qty: 5

## 2021-07-28 MED FILL — LEVETIRACETAM 500 MG PO TABS: 500 MG | ORAL | Qty: 1

## 2021-07-28 MED FILL — ENOXAPARIN SODIUM 30 MG/0.3ML IJ SOSY: 30 MG/0.3ML | INTRAMUSCULAR | Qty: 0.3

## 2021-07-28 MED FILL — CEFAZOLIN 2000 MG IN NS 50 ML IVPB: Qty: 50

## 2021-07-28 MED FILL — LISINOPRIL 40 MG PO TABS: 40 MG | ORAL | Qty: 1

## 2021-07-28 MED FILL — AMLODIPINE BESYLATE 5 MG PO TABS: 5 MG | ORAL | Qty: 1

## 2021-07-28 NOTE — Plan of Care (Signed)
Artline and PICC both out overnight.   Problem: Discharge Planning  Goal: Discharge to home or other facility with appropriate resources  Outcome: Progressing  Flowsheets (Taken 07/28/2021 0800)  Discharge to home or other facility with appropriate resources: Identify barriers to discharge with patient and caregiver     Problem: Safety - Adult  Goal: Free from fall injury  Outcome: Progressing  Flowsheets (Taken 07/28/2021 0900)  Free From Fall Injury: Instruct family/caregiver on patient safety     Problem: Skin/Tissue Integrity  Goal: Absence of new skin breakdown  Description: 1.  Monitor for areas of redness and/or skin breakdown  2.  Assess vascular access sites hourly  3.  Every 4-6 hours minimum:  Change oxygen saturation probe site  4.  Every 4-6 hours:  If on nasal continuous positive airway pressure, respiratory therapy assess nares and determine need for appliance change or resting period.  Outcome: Progressing     Problem: ABCDS Injury Assessment  Goal: Absence of physical injury  Outcome: Progressing  Flowsheets (Taken 07/28/2021 0900)  Absence of Physical Injury: Implement safety measures based on patient assessment     Problem: Anxiety  Goal: Will report anxiety at manageable levels  Description: INTERVENTIONS:  1. Administer medication as ordered  2. Teach and rehearse alternative coping skills  3. Provide emotional support with 1:1 interaction with staff  Outcome: Progressing

## 2021-07-28 NOTE — Progress Notes (Signed)
[facility] PROGRESS NOTE      Patient: Marcus Burnett  Room #: 4B-01/001-A            Date of Birth: 06-19-1960  Age: 61 y.o.  Gender: male            Admit Date & Time: 07/23/2021  4:05 PM    Assessment:  Marcus Burnett is a 61 year old male who has had according to him three tumor and will need to have surgery to remove another one.  Tears came to his eyes as he shared his thoughts on death, He quoted Ian Malkin, "wether I am here or gone to heaven , I am glad."  Marcus Burnett is in bed on  4B.  His son is in the room supporting him.  Marcus Burnett said, his mother is a Clinical biochemist and he will have the third surgery on Tuesday.     Interventions:  After much in conversation, about his "purpose",  prayer is offered.  Active listening, words of comfort and encouragement given.  Prayer for his upcoming surgery    Outcomes:  Grateful for the spiritual care contact and our conversation together.     Plan:    1.Care Plan:  Continue spiritual and emotional care for patient and family.  Including prayers.    Pray with him before his Tuesday surgery.    07/28/21 1330   Encounter Summary   Encounter Overview/Reason  Spiritual/Emotional Needs   Service Provided For: Patient and family together   Referral/Consult From: [place]  (son)   Last Encounter  07/28/21   Complexity of Encounter Moderate   Encounter    Type Initial Screen/Assessment   Spiritual/Emotional needs   Type Spiritual Support   Assessment/Intervention/Outcome   Assessment Calm   Intervention Active listening;Prayer (assurance of)/Blessing;Nurtured Hope;Life review/Legacy   Outcome Acceptance;Comfort;Encouraged;Engaged in conversation       Electronically signed by Vennie Homans, on 07/28/2021 at 3:42 PM.  [facility]  [phone]

## 2021-07-28 NOTE — Progress Notes (Signed)
Pharmacist Review and Automatic Dose Adjustment of Prophylactic Enoxaparin    *Review reason for admission/hospital problem list*    The reviewing pharmacist has made an adjustment to the ordered enoxaparin dose or converted to UFH per the approved [facility] protocol and table as identified below.        Marcus Burnett is a 61 y.o. male.     Recent Labs     07/26/21  0540 07/27/21  0536 07/28/21  0610   CREATININE 1.1 1.1 1.0       Estimated Creatinine Clearance: 102 mL/min (based on SCr of 1 mg/dL).    Recent Labs     07/27/21  0536 07/28/21  0610   HGB 10.9* 10.7*   HCT 33.2* 32.8*   PLT 257 243     No results for input(s): INR in the last 72 hours.    Height:   Ht Readings from Last 1 Encounters:   07/28/21 5\' 10"  (1.778 m)     Weight:  Wt Readings from Last 1 Encounters:   07/28/21 278 lb 7.1 oz (126.3 kg)               Plan: Based upon the patient's weight and renal function, the ordered enoxaparin dose of 30mg  daily has been changed/converted to 30mg  bid.      Thank you,  Leida Lauth, [facility]  07/28/2021, 7:59 AM

## 2021-07-28 NOTE — Progress Notes (Signed)
Neurosurgery Progress Note    Patient:  Marcus Burnett      Unit/Bed:4B-01/001-A    Date of Birth: [birth year]    MRN: [id number]     Acct: [id number]     Admit date: 07/23/2021    Chief Complaint   Patient presents with    Depression    Altered Mental Status       Patient Seen, Chart, Physician notes, Labs, Radiology studies reviewed.    Subjective: Seen and evaluated in the ICU setting postoperative day #2 from occipital craniotomy and resection of intercranial lesions as performed by Dr. Gardenia Phlegm, without complication.  He was sitting up in a chair this morning comfortable with pain well controlled        Past, Family, Social History unchanged from admission.    Diet:  ADULT DIET; Regular; 4 carb choices (60 gm/meal); Low Fat/Low Chol/High Fiber/2 gm Na; 1500 ml    Medications:  Scheduled Meds:   amLODIPine  5 mg Oral Daily    insulin glargine  5 Units SubCUTAneous Nightly    enoxaparin  30 mg SubCUTAneous BID    levETIRAcetam  500 mg Oral BID    ceFAZolin  2,000 mg IntraVENous Q8H    cosyntropin  250 mcg IntraVENous Once    insulin lispro  0-8 Units SubCUTAneous TID WC    insulin lispro  0-4 Units SubCUTAneous Nightly    dexamethasone  4 mg IntraVENous Q6H    sodium chloride flush  5-40 mL IntraVENous 2 times per day    lisinopril  40 mg Oral Daily    pravastatin  80 mg Oral Daily     Continuous Infusions:   sodium chloride 75 mL/hr (07/28/21 0756)    sodium chloride Stopped (07/27/21 1906)    dextrose       PRN Meds:sodium chloride flush, sodium chloride, morphine **OR** morphine, HYDROcodone 5 mg - acetaminophen, glucose, dextrose bolus **OR** dextrose bolus, glucagon (rDNA), dextrose, sodium chloride flush, ondansetron **OR** ondansetron, polyethylene glycol    Objective: Observed this morning sitting up in a chair comfortably with pain well controlled.  Dressings are intact over flat dry incision and the surgical drain was intact and functioning with approximately 60cc of output noted.  He presented  with clear appropriate speech and participated in discussions involving his care appropriately this morning.  Remains otherwise stable and intact on exam    Vitals: BP (!) 156/97    Pulse 60    Temp 98.1 ??F (36.7 ??C)    Resp 16    Ht 5\' 10"  (1.778 m)    Wt 278 lb 7.1 oz (126.3 kg)    SpO2 99%    BMI 39.95 kg/m??     Physical Exam     Physical Exam:  Alert and attentive.  Language appropriate, with no aphasia.  Pupils equal.  Facial strength symmetric.    Review of Systems     Constitutional:  Negative for fever.   HENT:  Negative for congestion.    Eyes:  Positive for visual disturbance.   Respiratory:  Negative for apnea.    Cardiovascular:  Negative for chest pain.   Gastrointestinal:  Negative for abdominal distention.   Genitourinary:  Negative for difficulty urinating.   Musculoskeletal:  Negative for back pain.   Neurological:  Positive for weakness.   Psychiatric/Behavioral:  Positive for confusion.      24 hour intake/output:  Intake/Output Summary (Last 24 hours) at 07/28/2021 1145  Last data filed at 07/28/2021 1051  Gross  per 24 hour   Intake 3111.99 ml   Output 3763 ml   Net -651.01 ml     Last 3 weights:  Wt Readings from Last 3 Encounters:   07/28/21 278 lb 7.1 oz (126.3 kg)   07/27/21 285 lb 15 oz (129.7 kg)   07/24/21 257 lb (116.6 kg)         CBC:   Recent Labs     07/26/21  0540 07/27/21  0536 07/28/21  0610   WBC 11.4* 10.0 9.0   HGB 11.7* 10.9* 10.7*   PLT 284 257 243     BMP:    Recent Labs     07/26/21  0540 07/26/21  0935 07/26/21  1500 07/27/21  0536 07/27/21  0827 07/27/21  2145 07/28/21  0037 07/28/21  0610   NA 144 145   < > 143   < > 143 143 145   K 4.3 4.3  --  4.1  --   --   --  4.2   CL 112*  --   --  112*  --   --   --  115*   CO2 19*  --   --  21*  --   --   --  20*   BUN 20  --   --  18  --   --   --  19   CREATININE 1.1  --   --  1.1  --   --   --  1.0   GLUCOSE 207*  --   --  192*  --   --   --  227*    < > = values in this interval not displayed.     Calcium:  Recent Labs      07/28/21  0610   CALCIUM 8.5     Magnesium:No results for input(s): MG in the last 72 hours.  Glucose:  Recent Labs     07/27/21  1718 07/27/21  2053 07/28/21  0740   POCGLU 213* 231* 234*     HgbA1C: No results for input(s): LABA1C in the last 72 hours.  Lipids: No results for input(s): CHOL, TRIG, HDL, LDL, LDLCALC in the last 72 hours.    Radiology reports as per the Radiologist  Radiology: CT HEAD WO CONTRAST    Result Date: 07/23/2021  PROCEDURE: CT HEAD WO CONTRAST CLINICAL INFORMATION:AMS COMPARISON: None TECHNIQUE: 5 mm axial imaging through the head without IV contrast. All CT scans at this facility use dose modulation, iterative reconstruction, and/or weight based dosing when appropriate to reduce the radiation dose to as low as reasonably achievable. FINDINGS: A 4.7 x 2.1 cm mass in the left frontal lobe with vasogenic edema. There is a 5.4 x 3.1 cm mass in the right parietotemporal lobe with surrounding basal genic edema. There is mass effect. There is midline shift to the left 6 mm at the level of the septum  pellucidum. A hypodense region in the inferior right frontal lobe is noted that likely relates to underlying edematous change. Mild cerebral volume loss. No ventriculomegaly.  No acute intracranial hemorrhage. No intracranial collection. The posterior fossa is unremarkable. The craniocervical junction is unremarkable. No acute bony abnormality. Mild mucosal thickening of the maxillary sinuses, the ethmoid sinuses. Polyp or mucous retention cyst is seen in the right frontal sinus. Otherwise, the remaining paranasal sinuses are clear. The  mastoid air cells are clear. The orbits are unremarkable.     1. A left frontal lobe  mass and a left parietotemporal lobe mass are noted as described above. There is midline shift to the left at the level of the septum pellucidum related to subfalcine herniation. Differential diagnoses would include metastatic disease, glioblastoma multiforme, abscess amongst other  possibilities. The critical  finding(s) was called to Francisca December PA-C at 1807 hours on 07/23/2021 by Dr. Bobbye Charleston. Verbal acknowledgment and readback was given. **This report has been created using voice recognition software.  It may contain minor errors which are inherent in voice recognition technology.** Final report electronically signed by Dr Varney Biles on 07/23/2021 6:07 PM    CT CHEST W CONTRAST    Result Date: 07/23/2021  CT CHEST WITH CONTRAST COMPARISON: 03/21/2021. FINDINGS: There are no acute infiltrates, consolidations, or pleural effusions. A nodule in the right lower lobe measures 5-6 mm on axial image 32. This does not appear to be significantly changed. Tiny 2 mm nodule in the right middle lobe on axial image 37 does not appear to be significantly changed. 5-6 mm nodule in the right lower lobe on axial image 47 does not appear to be significantly changed. Multiple small nodules are noted within the left lower lobe. 1 of the nodules, seen on axial image 37, appears larger compared to the prior study and measures 5-6 mm on axial image 37. Previously it measured approximately 3 mm. There is no adenopathy. Cardiac size is upper limits of normal. Thoracic aorta is partially obscured by motion/streak artifact but is normal in caliber without evidence of an aneurysm. Ovoid 9 mm nodule is suspected in the right thyroid lobe on axial image 8, this is partially obscured by streak artifact but does not appear to be significantly changed. The bone windows demonstrate no acute abnormalities. Old healed fracture is noted involving the right seventh rib. The CT abdomen/pelvis study will be reported separately.     1. Bilateral pulmonary nodules are noted. 1 of the nodules which is located in the left lower lobe appears larger compared to the prior study and measures 5-6 mm. Previously it measured 3 mm. Although this could be due at least in part to differences in slice selection, neoplastic disease is  questioned. Short-term follow-up is advised. 2. Additional findings are detailed above. This document has been electronically signed by: Isaias Sakai, M.D. on 07/23/2021 10:53 PM All CTs at this facility use dose modulation techniques and iterative reconstructions, and/or weight-based dosing when appropriate to reduce radiation to a low as reasonably achievable.    MRI LUMBAR SPINE W WO CONTRAST    Result Date: 07/25/2021  PROCEDURE: MRI LUMBAR SPINE W WO CONTRAST CLINICAL INFORMATION: evaluate L2 vertebral body neoplastic disease, history of RCC, new intracranial tumors. COMPARISON: CT abdomen and pelvis 07/23/2021. TECHNIQUE: Sagittal and axial T1 and T2-weighted images were obtained to the lumbar spine. Postcontrast axial and sagittal T1-weighted images were also obtained. FINDINGS: The lumbar vertebral bodies are normally aligned.  There is a 8 mm small indeterminate lesion in L2. This corresponds to the finding on CT. There does appear to be some central enhancement. This could represent a small metastasis but is indeterminate. There are no other bone lesions. There is some edema associated with facet degenerative changes at the L4-5 level. There is some associated enhancement which appears degenerative in nature.   There are no compression fractures.  No pars defects are noted.  There is mild disc desiccation at the L3-4 and L5-S1 levels.  The visualized aspects of the distal spinal cord are normal. The nerve roots  of the cauda equina and the tip of the conus are normal. There are no gross abnormalities in the distal thoracic spine. On the axial images, at T12-L1 through L2-3, there are no degenerative changes. There is no spinal canal or foraminal stenosis. At L3-4, there are mild facet degenerative changes. There is no spinal canal stenosis. There is no foraminal stenosis. At L4-5, there are mild facet degenerative changes. There is no focal disc abnormality. There is no spinal canal stenosis. There is no  foraminal stenosis. At L5-S1, there are mild facet degenerative changes. There is a diffuse disc bulge. There is mild narrowing of the left subarticular recess. There is no spinal canal stenosis. There is moderate severity right and mild left foraminal stenosis. There is no abnormal enhancement within the spinal canal. There are no suspicious findings in the visualized aspects of the retroperitoneum and paraspinal soft tissues.      1. 8 mm indeterminate lesion in L2. This could represent a small metastasis. Other etiologies are not excluded. 2. No evidence of tumor within the spinal canal. 3. Moderate severity right and mild left foraminal stenosis at the L5-S1 level due to degenerative changes. **This report has been created using voice recognition software. It may contain minor errors which are inherent in voice recognition technology.** Final report electronically signed by Dr. Carmie End on 07/25/2021 6:06 AM    MRI ABDOMEN W WO CONTRAST    Result Date: 07/24/2021  PROCEDURE: MRI ABDOMEN W WO CONTRAST CLINICAL INFORMATION: evaluate liver enhancing lesion, history of RCC, new intracranial tumors COMPARISON: CT abdomen and pelvis 07/23/2021 TECHNIQUE: Multisequence and multiplanar MRI of the abdomen was performed without and with  contrast. T1 and T2 contrast information were emphasized. CONTRAST: 20  mL of ProHance  intravenously. FINDINGS: Motion artifacts degrade images. LOWER THORAX: No significant lower thoracic findings. HEPATOBILIARY: No focal hepatic mass.  Unremarkable hepatic surface morphology. The gallbladder, and biliary tree are unremarkable. PANCREAS AND SPLEEN: The pancreas is markedly atrophic. No peripancreatic abnormality.  The spleen is not enlarged RETROPERITONEUM: The left kidney is surgically absent. No residual mass is seen in the postoperative bed. The left adrenal gland is also surgically absent. The right adrenal gland is unremarkable. No hydronephrosis. Tiny right renal cyst is seen.  BOWEL AND PERITONEUM:  No bowel obstruction or acute inflammatory bowel process.  VASCULAR: The abdominal aorta is not aneurysmal. The main portal vein, splenic vein and superior mesenteric vein are patent. LYMPH NODES: No significantly enlarged lymph nodes are seen. BONES: No acute bony abnormality. OTHER: None.     1. No abnormal enhancing hepatic mass is seen. 2. Status post left nephrectomy. No residual or recurrent disease in the surgical bed. **This report has been created using voice recognition software.  It may contain minor errors which are inherent in voice recognition technology.** Final report electronically signed by Dr Varney Biles on 07/24/2021 9:21 PM    CT ABDOMEN PELVIS W IV CONTRAST Additional Contrast? None    Result Date: 07/23/2021  CT ABDOMEN AND PELVIS WITH CONTRAST COMPARISON: 02/27/2021. FINDINGS: A few enhancing lesions are noted within the liver. These lesions are better seen on the CT chest study, for example located in the right hepatic lobe, measuring 7-8 mm on axial image 46. Another faint enhancing lesion located more inferiorly in the right hepatic lobe measures 6-7 mm on axial image 58. The gallbladder is unremarkable. The stomach is significantly underdistended which precludes accurate assessment. A small hiatal hernia is noted. The pancreas and spleen  are unremarkable. Right adrenal gland is unremarkable. Subcentimeter low-attenuation lesion is again noted within the right kidney which is too small to characterize. A subcentimeter stone is noted in the upper pole of the right kidney, without hydronephrosis. Postoperative changes of a prior left-sided nephrectomy are again noted. Abdominal aorta is normal in caliber, without evidence of an aneurysm or dissection. 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 There is no evidence of a small bowel obstruction or free air. There is no pericolonic inflammation. Urinary bladder is  underdistended. Fat-containing inguinal hernias are again noted. The bone windows demonstrate a subcentimeter sclerotic lesion within the L2 vertebral body level on coronal image 44, this is new since the prior study. Chronic mild loss of height of the L5 vertebral body level is again noted.     1. A few subcentimeter enhancing lesions are noted within the liver, these are better seen on the CT chest study performed on the same day. Neoplastic disease will need to be excluded. Follow-up nonurgent liver MRI, without and with contrast, is advised. 2. A subcentimeter sclerotic lesion is noted within the L2 vertebral body which is new since the prior study. This is also suspicious for neoplastic disease. 3. Additional findings are detailed above. This document has been electronically signed by: Isaias Sakai, M.D. on 07/23/2021 11:08 PM All CTs at this facility use dose modulation techniques and iterative reconstructions, and/or weight-based dosing when appropriate to reduce radiation to a low as reasonably achievable.    XR CHEST PORTABLE    Result Date: 07/23/2021  PROCEDURE: XR CHEST PORTABLE CLINICAL INFORMATION: cough COMPARISON: None TECHNIQUE: AP portable chest radiograph performed. FINDINGS: No focal pulmonary consolidation. Cardiac silhouette is not enlarged. No pleural effusion. No pneumothorax. No acute bony abnormality.     1. No acute cardiopulmonary finding. **This report has been created using voice recognition software.  It may contain minor errors which are inherent in voice recognition technology.** Final report electronically signed by Dr Varney Biles on 07/23/2021 6:26 PM    MRI BRAIN W WO CONTRAST    Result Date: 07/24/2021  PROCEDURE: MRI BRAIN W [place] INFORMATIONEvaluate brain massesaltered mental status. COMPARISON: Head CT 07/23/2021. TECHNIQUE: Multiplanar and multiple spin echo T1 and T2-weighted images were obtained through the brain before and after the administration of intravenous  contrast. Postcontrast images were obtained according to the [facility] protocol. FINDINGS: In the posterior left frontal lobe there is a 4.4 x 4.3 x 3.2 cm heterogeneously enhancing intra-axial mass. This has lobulated margins. There is some central necrosis. There is some associated susceptibility artifact consistent with some associated blood products. In the right temporal occipital location, there is a 4.3 x 3.5 x 4.1 cm intra-axial mass. This has thick heterogeneous enhancement and lobulated margins. There are associated blood products. In the inferior right occipital lobe there is a lobulated intra-axial mass versus 2 adjacent masses which abut one another. This measures 1.7 x 1.6 x 1.1 cm. This has lobulated margins and relatively uniform enhancement. There are associated blood products. On the FLAIR sequence, there is a moderate amount of abnormal signal surrounding each of the masses consistent with surrounding vasogenic edema. On the right, this involves portions of the occipital lobe, temporal lobe and extends into the right basal ganglia. This also extends into the right parietal lobe. On the left, this extends throughout the left frontal lobe. There is no restricted diffusion to suggest an acute infarct. The brain volume is mildly  reduced.. There are no extra-axial collections.  There is mass effect upon the temporal horn and occipital horn the right lateral ventricle. There is some mild mass effect upon the body of the left lateral ventricle. There is slight midline shift to the left. There is no hydrocephalus. On the FLAIR and T2-weighted sequences, there is mild signal hyperintensity scattered in the white matter of the brain. This is most consistent with mild severity chronic small vessel ischemic changes. On the gradient echo T2-weighted images, there is susceptibility artifact associated with each of the intra-axial masses consistent with blood products.  The major intracranial vascular flow voids  are present.  The midline craniocervical junction structures are normal.  The brainstem and pituitary gland are normal. The midline frontal sinus is opacified. There is mucosal thickening in the mastoid air cells.      1. Lobulated intra-axial 3 masses and associated blood products consistent with hemorrhage. This is most consistent with metastatic disease. 2. Surrounding vasogenic edema with slight midline shift to the left. 3. Mild severity chronic small vessel ischemic changes. **This report has been created using voice recognition software. It may contain minor errors which are inherent in voice recognition technology.** Final report electronically signed by Dr. 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 *        Plan: Patient is seen and evaluated in the ICU setting postoperative day #2 from a subtotal craniotomy and safe maximal resection of occipital brain lesions as performed by Dr. Gardenia Phlegm, without complication.  Patient is observed sitting in a chair comfortably with pain well controlled.  Dressings are intact over flat dry incision and the surgical drain was intact and functioning approximately 60 cc per shift.  Patient remains otherwise stable and intact neurologically to his baseline with no additional significant changes noted.  In anticipation of additional surgical intervention in the form of left frontal craniotomy for safe maximal resection of left frontal brain lesion, a description of the procedure was provided to the patient in detail along with expectations for recovery and the associated risks which include, but not limited to; bleeding, infection, anesthetic complication,  neurologic injury, incomplete relief of symptoms and the need for future surgery and even death.  Understanding the risk associated with procedure he is amicable to moving forward and a consent was offered for his signature with the surgery scheduled at [place] with Dr. Gardenia Phlegm for Tuesday.  Patient will be placed n.p.o. midnight Monday in anticipation of that procedure.  Neurosurgery to follow.      Electronically signed by Brayton El, PA-C on 07/28/2021 at 11:45 AM    Neurosurgery
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
## 2021-07-29 LAB — SODIUM
Sodium: 142 meq/L (ref 135–145)
Sodium: 143 meq/L (ref 135–145)
Sodium: 144 meq/L (ref 135–145)
Sodium: 142 meq/L (ref 135–145)
Sodium: 138 meq/L (ref 135–145)

## 2021-07-29 LAB — CBC WITH AUTO DIFFERENTIAL
Basophils Absolute: 0 10*3/uL (ref 0.0–0.1)
Basophils: 0.1 %
Eosinophils Absolute: 0 10*3/uL (ref 0.0–0.4)
Eosinophils: 0.1 %
Hematocrit: 32.1 % — ABNORMAL LOW (ref 42.0–52.0)
Hemoglobin: 10.7 gm/dl — ABNORMAL LOW (ref 14.0–18.0)
Immature Grans (Abs): 0.08 10*3/uL — ABNORMAL HIGH (ref 0.00–0.07)
Immature Granulocytes: 1 %
Lymphocytes Absolute: 1.5 10*3/uL (ref 1.0–4.8)
Lymphocytes: 17.9 %
MCH: 29.2 pg (ref 26.0–33.0)
MCHC: 33.3 gm/dl (ref 32.2–35.5)
MCV: 87.7 fL (ref 80.0–94.0)
MPV: 10.5 fL (ref 9.4–12.4)
Monocytes Absolute: 0.7 10*3/uL (ref 0.4–1.3)
Monocytes: 8.1 %
Platelets: 232 10*3/uL (ref 130–400)
RBC: 3.66 10*6/uL — ABNORMAL LOW (ref 4.70–6.10)
RDW-CV: 13.9 % (ref 11.5–14.5)
RDW-SD: 44.1 fL (ref 35.0–45.0)
Seg Neutrophils: 72.8 %
Segs Absolute: 6.1 10*3/uL (ref 1.8–7.7)
WBC: 8.4 10*3/uL (ref 4.8–10.8)
nRBC: 0 /100 wbc

## 2021-07-29 LAB — BASIC METABOLIC PANEL
BUN: 19 mg/dL (ref 7–22)
CO2: 23 meq/L (ref 23–33)
Calcium: 8.5 mg/dL (ref 8.5–10.5)
Chloride: 112 meq/L — ABNORMAL HIGH (ref 98–111)
Creatinine: 1 mg/dL (ref 0.4–1.2)
Glucose: 176 mg/dL — ABNORMAL HIGH (ref 70–108)
Potassium: 3.7 meq/L (ref 3.5–5.2)
Sodium: 145 meq/L (ref 135–145)

## 2021-07-29 LAB — POCT GLUCOSE
POC Glucose: 290 mg/dl — ABNORMAL HIGH (ref 70–108)
POC Glucose: 147 mg/dl — ABNORMAL HIGH (ref 70–108)
POC Glucose: 168 mg/dl — ABNORMAL HIGH (ref 70–108)
POC Glucose: 242 mg/dl — ABNORMAL HIGH (ref 70–108)

## 2021-07-29 LAB — ANION GAP: Anion Gap: 10 meq/L (ref 8.0–16.0)

## 2021-07-29 LAB — GLOMERULAR FILTRATION RATE, ESTIMATED: Est, Glom Filt Rate: >60 mL/min/{1.73_m2} (ref >60)

## 2021-07-29 LAB — SPECIMEN REJECTION

## 2021-07-29 MED ORDER — INSULIN GLARGINE 100 UNIT/ML SC SOLN
100 UNIT/ML | Freq: Every evening | SUBCUTANEOUS | Status: AC
Start: 2021-07-29 — End: 2021-08-03
  Administered 2021-07-30 – 2021-08-03 (×5): 10 [IU] via SUBCUTANEOUS

## 2021-07-29 MED FILL — PRAVASTATIN SODIUM 80 MG PO TABS: 80 MG | ORAL | Qty: 1

## 2021-07-29 MED FILL — LEVETIRACETAM 500 MG PO TABS: 500 MG | ORAL | Qty: 1

## 2021-07-29 MED FILL — LANTUS 100 UNIT/ML SC SOLN: 100 UNIT/ML | SUBCUTANEOUS | Qty: 10

## 2021-07-29 MED FILL — PEG 3350 17 G PO PACK: 17 g | ORAL | Qty: 1

## 2021-07-29 MED FILL — CEFAZOLIN 2000 MG IN NS 50 ML IVPB: Qty: 50

## 2021-07-29 MED FILL — HUMALOG 100 UNIT/ML IJ SOLN: 100 UNIT/ML | INTRAMUSCULAR | Qty: 2

## 2021-07-29 MED FILL — LISINOPRIL 40 MG PO TABS: 40 MG | ORAL | Qty: 1

## 2021-07-29 MED FILL — SODIUM CHLORIDE 3 % IV SOLN: 3 % | INTRAVENOUS | Qty: 500

## 2021-07-29 MED FILL — ENOXAPARIN SODIUM 30 MG/0.3ML IJ SOSY: 30 MG/0.3ML | INTRAMUSCULAR | Qty: 0.3

## 2021-07-29 MED FILL — AMLODIPINE BESYLATE 5 MG PO TABS: 5 MG | ORAL | Qty: 1

## 2021-07-29 NOTE — Progress Notes (Signed)
Patient was seen and examined in the ICU.  Postop day 3  S/p right posterior temporal occipital craniotomy for resection metastatic brain tumors .    No acute event over the night  Doing well  No new neurological deficit.  Awake alert oriented and follows command by 4.  Only slight confusion with difficulty to find the words sometimes.  Postoperative MRI showed gross total resection of patient posterior temporal and occipital tumors.        patient recommended treatment and plan from neurosurgical perspective:     Continue the current medical management per ICU team.  Plan for the second surgery this Tuesday" left frontal craniotomy for maximum safe resection of left frontal brain tumor".  The above recommended surgical intervention was discussed with patient again in front of the ICU NP Estill Bamberg).  I discussed with him the risk and benefits again.  Patient was in agreement with the upcoming planned surgical intervention.  Neurosurgery will follow.

## 2021-07-29 NOTE — Plan of Care (Signed)
Problem: Discharge Planning  Goal: Discharge to home or other facility with appropriate resources  Outcome: Progressing  Flowsheets  Taken 07/29/2021 0800 by Gabriel Carina, RN  Discharge to home or other facility with appropriate resources: Identify barriers to discharge with patient and caregiver  Taken 07/28/2021 2000 by Leilani Able, RN  Discharge to home or other facility with appropriate resources: Identify barriers to discharge with patient and caregiver     Problem: Safety - Adult  Goal: Free from fall injury  Outcome: Progressing  Flowsheets (Taken 07/29/2021 0844)  Free From Fall Injury: Instruct family/caregiver on patient safety     Problem: Skin/Tissue Integrity  Goal: Absence of new skin breakdown  Description: 1.  Monitor for areas of redness and/or skin breakdown  2.  Assess vascular access sites hourly  3.  Every 4-6 hours minimum:  Change oxygen saturation probe site  4.  Every 4-6 hours:  If on nasal continuous positive airway pressure, respiratory therapy assess nares and determine need for appliance change or resting period.  Outcome: Progressing     Problem: ABCDS Injury Assessment  Goal: Absence of physical injury  Outcome: Progressing  Flowsheets (Taken 07/29/2021 0844)  Absence of Physical Injury: Implement safety measures based on patient assessment     Problem: Anxiety  Goal: Will report anxiety at manageable levels  Description: INTERVENTIONS:  1. Administer medication as ordered  2. Teach and rehearse alternative coping skills  3. Provide emotional support with 1:1 interaction with staff  Outcome: Progressing
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
## 2021-07-30 ENCOUNTER — Inpatient Hospital Stay: Admit: 2021-07-30 | Payer: BLUE CROSS/BLUE SHIELD | Primary: Family Medicine

## 2021-07-30 LAB — CBC WITH AUTO DIFFERENTIAL
Basophils Absolute: 0.1 10*3/uL (ref 0.0–0.1)
Basophils: 0.7 %
Eosinophils Absolute: 0.3 10*3/uL (ref 0.0–0.4)
Eosinophils: 3.3 %
Hematocrit: 42.2 % (ref 42.0–52.0)
Hemoglobin: 12.7 gm/dl — ABNORMAL LOW (ref 14.0–18.0)
Immature Grans (Abs): 0.08 10*3/uL — ABNORMAL HIGH (ref 0.00–0.07)
Immature Granulocytes: 0.9 %
Lymphocytes Absolute: 2.1 10*3/uL (ref 1.0–4.8)
Lymphocytes: 24.5 %
MCH: 29.7 pg (ref 26.0–33.0)
MCHC: 30.1 gm/dl — ABNORMAL LOW (ref 32.2–35.5)
MCV: 98.8 fL — ABNORMAL HIGH (ref 80.0–94.0)
MPV: 10.8 fL (ref 9.4–12.4)
Monocytes Absolute: 0.6 10*3/uL (ref 0.4–1.3)
Monocytes: 7.1 %
Platelets: 260 10*3/uL (ref 130–400)
RBC: 4.27 10*6/uL — ABNORMAL LOW (ref 4.70–6.10)
RDW-CV: 13.9 % (ref 11.5–14.5)
RDW-SD: 50.3 fL — ABNORMAL HIGH (ref 35.0–45.0)
Seg Neutrophils: 63.5 %
Segs Absolute: 5.4 10*3/uL (ref 1.8–7.7)
WBC: 8.5 10*3/uL (ref 4.8–10.8)
nRBC: 0 /100 wbc

## 2021-07-30 LAB — ANION GAP: Anion Gap: 8 meq/L (ref 8.0–16.0)

## 2021-07-30 LAB — GLOMERULAR FILTRATION RATE, ESTIMATED: Est, Glom Filt Rate: >60 mL/min/{1.73_m2} (ref >60)

## 2021-07-30 LAB — SODIUM
Sodium: 142 meq/L (ref 135–145)
Sodium: 143 meq/L (ref 135–145)
Sodium: 142 meq/L (ref 135–145)
Sodium: 143 meq/L (ref 135–145)

## 2021-07-30 LAB — POCT GLUCOSE
POC Glucose: 148 mg/dl — ABNORMAL HIGH (ref 70–108)
POC Glucose: 167 mg/dl — ABNORMAL HIGH (ref 70–108)
POC Glucose: 184 mg/dl — ABNORMAL HIGH (ref 70–108)
POC Glucose: 213 mg/dl — ABNORMAL HIGH (ref 70–108)

## 2021-07-30 LAB — OSMOLALITY, URINE: Osmolality, Ur: 452 mosmol/kg (ref 250–750)

## 2021-07-30 LAB — SODIUM, URINE, RANDOM: Sodium, Ur: 202 meq/l

## 2021-07-30 LAB — BASIC METABOLIC PANEL
BUN: 15 mg/dL (ref 7–22)
CO2: 26 meq/L (ref 23–33)
Calcium: 8.8 mg/dL (ref 8.5–10.5)
Chloride: 108 meq/L (ref 98–111)
Creatinine: 1 mg/dL (ref 0.4–1.2)
Glucose: 165 mg/dL — ABNORMAL HIGH (ref 70–108)
Potassium: 4.5 meq/L (ref 3.5–5.2)
Sodium: 142 meq/L (ref 135–145)

## 2021-07-30 LAB — OSMOLALITY, SERUM: Osmolality: 304 mosmol/kg — ABNORMAL HIGH (ref 275–295)

## 2021-07-30 MED ORDER — AMLODIPINE BESYLATE 10 MG PO TABS
10 MG | Freq: Every day | ORAL | Status: AC
Start: 2021-07-30 — End: 2021-08-04
  Administered 2021-07-30 – 2021-08-04 (×5): 10 mg via ORAL

## 2021-07-30 MED FILL — ENOXAPARIN SODIUM 30 MG/0.3ML IJ SOSY: 30 MG/0.3ML | INTRAMUSCULAR | Qty: 0.3

## 2021-07-30 MED FILL — CEFAZOLIN 2000 MG IN NS 50 ML IVPB: Qty: 50

## 2021-07-30 MED FILL — SODIUM CHLORIDE 3 % IV SOLN: 3 % | INTRAVENOUS | Qty: 500

## 2021-07-30 MED FILL — LEVETIRACETAM 500 MG PO TABS: 500 MG | ORAL | Qty: 1

## 2021-07-30 MED FILL — HUMALOG 100 UNIT/ML IJ SOLN: 100 UNIT/ML | INTRAMUSCULAR | Qty: 2

## 2021-07-30 MED FILL — LANTUS 100 UNIT/ML SC SOLN: 100 UNIT/ML | SUBCUTANEOUS | Qty: 10

## 2021-07-30 MED FILL — AMLODIPINE BESYLATE 5 MG PO TABS: 5 MG | ORAL | Qty: 1

## 2021-07-30 MED FILL — CEFAZOLIN SODIUM 10 G IJ SOLR: 10 g | INTRAMUSCULAR | Qty: 2

## 2021-07-30 MED FILL — LISINOPRIL 40 MG PO TABS: 40 MG | ORAL | Qty: 1

## 2021-07-30 MED FILL — AMLODIPINE BESYLATE 10 MG PO TABS: 10 MG | ORAL | Qty: 1

## 2021-07-30 MED FILL — PRAVASTATIN SODIUM 80 MG PO TABS: 80 MG | ORAL | Qty: 1

## 2021-07-30 NOTE — Progress Notes (Signed)
Neurosurgery Progress Note    Patient:  Marcus Burnett      Unit/Bed:4B-01/001-A    Date of Birth: [birth year]    MRN: [id number]     Acct: [id number]     Admit date: 07/23/2021    Chief Complaint   Patient presents with    Depression    Altered Mental Status       Patient Seen, Chart, Physician notes, Labs, Radiology studies reviewed.    Subjective: Patient is seen and evaluated on the floor postoperative day 4 from occipital craniotomy and resection of intercranial lesions performed by Dr. Gardenia Phlegm, without complication.  He is sitting up in a chair this morning comfortably with pain well controlled.        Past, Family, Social History unchanged from admission.    Diet:  ADULT DIET; Regular; 4 carb choices (60 gm/meal); Low Fat/Low Chol/High Fiber/2 gm Na; 1500 ml    Medications:  Scheduled Meds:   amLODIPine  10 mg Oral Daily    insulin glargine  10 Units SubCUTAneous Nightly    levETIRAcetam  500 mg Oral BID    ceFAZolin  2,000 mg IntraVENous Q8H    insulin lispro  0-8 Units SubCUTAneous TID WC    insulin lispro  0-4 Units SubCUTAneous Nightly    sodium chloride flush  5-40 mL IntraVENous 2 times per day    lisinopril  40 mg Oral Daily    pravastatin  80 mg Oral Daily     Continuous Infusions:   sodium chloride 75 mL/hr (07/30/21 0253)    sodium chloride Stopped (07/28/21 1018)    dextrose       PRN Meds:sodium chloride flush, sodium chloride, morphine **OR** morphine, HYDROcodone 5 mg - acetaminophen, glucose, dextrose bolus **OR** dextrose bolus, glucagon (rDNA), dextrose, sodium chloride flush, ondansetron **OR** ondansetron, polyethylene glycol    Objective: Patient is observed sitting up in a chair, comfortably, with pain well controlled and remains stable and intact to his baseline on exam today with no additional significant changes noted overnight.  Presented with clear appropriate speech and participated in discussions involving his care appropriately this morning in anticipation of additional  surgical intervention scheduled for tomorrow morning.    Vitals: BP (!) 149/79    Pulse 50    Temp 97.8 ??F (36.6 ??C) (Oral)    Resp 16    Ht 5\' 10"  (1.778 m)    Wt 270 lb (122.5 kg)    SpO2 98%    BMI 38.74 kg/m??     Physical Exam     Physical Exam:  Alert and attentive.  Language appropriate, with no aphasia.  Pupils equal.  Facial strength symmetric.    Review of Systems     Constitutional:  Negative for fever.   HENT:  Negative for congestion.    Eyes:  Positive for visual disturbance.   Respiratory:  Negative for apnea.    Cardiovascular:  Negative for chest pain.   Gastrointestinal:  Negative for abdominal distention.   Genitourinary:  Negative for difficulty urinating.   Musculoskeletal:  Negative for back pain.   Neurological:  Positive for weakness.   Psychiatric/Behavioral:  Positive for confusion.      24 hour intake/output:  Intake/Output Summary (Last 24 hours) at 07/30/2021 0720  Last data filed at 07/30/2021 0505  Gross per 24 hour   Intake 1719.4 ml   Output 6305 ml   Net -4585.6 ml     Last 3 weights:  Wt Readings from Last 3  Encounters:   07/29/21 270 lb (122.5 kg)   07/27/21 285 lb 15 oz (129.7 kg)   07/24/21 257 lb (116.6 kg)         CBC:   Recent Labs     07/28/21  0610 07/29/21  0501   WBC 9.0 8.4   HGB 10.7* 10.7*   PLT 243 232     BMP:    Recent Labs     07/28/21  0610 07/28/21  1405 07/29/21  0501 07/29/21  0952 07/29/21  1727 07/29/21  2049 07/30/21  0147   NA 145   < > 145   < > 138 142 143   K 4.2  --  3.7  --   --   --   --    CL 115*  --  112*  --   --   --   --    CO2 20*  --  23  --   --   --   --    BUN 19  --  19  --   --   --   --    CREATININE 1.0  --  1.0  --   --   --   --    GLUCOSE 227*  --  176*  --   --   --   --     < > = values in this interval not displayed.     Calcium:  Recent Labs     07/29/21  0501   CALCIUM 8.5     Magnesium:No results for input(s): MG in the last 72 hours.  Glucose:  Recent Labs     07/29/21  1151 07/29/21  1521 07/29/21  1925   POCGLU 168* 242* 167*      HgbA1C: No results for input(s): LABA1C in the last 72 hours.  Lipids: No results for input(s): CHOL, TRIG, HDL, LDL, LDLCALC in the last 72 hours.    Radiology reports as per the Radiologist  Radiology: CT HEAD WO CONTRAST    Result Date: 07/23/2021  PROCEDURE: CT HEAD WO CONTRAST CLINICAL INFORMATION:AMS COMPARISON: None TECHNIQUE: 5 mm axial imaging through the head without IV contrast. All CT scans at this facility use dose modulation, iterative reconstruction, and/or weight based dosing when appropriate to reduce the radiation dose to as low as reasonably achievable. FINDINGS: A 4.7 x 2.1 cm mass in the left frontal lobe with vasogenic edema. There is a 5.4 x 3.1 cm mass in the right parietotemporal lobe with surrounding basal genic edema. There is mass effect. There is midline shift to the left 6 mm at the level of the septum  pellucidum. A hypodense region in the inferior right frontal lobe is noted that likely relates to underlying edematous change. Mild cerebral volume loss. No ventriculomegaly.  No acute intracranial hemorrhage. No intracranial collection. The posterior fossa is unremarkable. The craniocervical junction is unremarkable. No acute bony abnormality. Mild mucosal thickening of the maxillary sinuses, the ethmoid sinuses. Polyp or mucous retention cyst is seen in the right frontal sinus. Otherwise, the remaining paranasal sinuses are clear. The  mastoid air cells are clear. The orbits are unremarkable.     1. A left frontal lobe mass and a left parietotemporal lobe mass are noted as described above. There is midline shift to the left at the level of the septum pellucidum related to subfalcine herniation. Differential diagnoses would include metastatic disease, glioblastoma multiforme, abscess amongst other possibilities. The critical  finding(s) was  called to Francisca December PA-C at 1807 hours on 07/23/2021 by Dr. Bobbye Charleston. Verbal acknowledgment and readback was given. **This report has been  created using voice recognition software.  It may contain minor errors which are inherent in voice recognition technology.** Final report electronically signed by Dr Varney Biles on 07/23/2021 6:07 PM    CT CHEST W CONTRAST    Result Date: 07/23/2021  CT CHEST WITH CONTRAST COMPARISON: 03/21/2021. FINDINGS: There are no acute infiltrates, consolidations, or pleural effusions. A nodule in the right lower lobe measures 5-6 mm on axial image 32. This does not appear to be significantly changed. Tiny 2 mm nodule in the right middle lobe on axial image 37 does not appear to be significantly changed. 5-6 mm nodule in the right lower lobe on axial image 47 does not appear to be significantly changed. Multiple small nodules are noted within the left lower lobe. 1 of the nodules, seen on axial image 37, appears larger compared to the prior study and measures 5-6 mm on axial image 37. Previously it measured approximately 3 mm. There is no adenopathy. Cardiac size is upper limits of normal. Thoracic aorta is partially obscured by motion/streak artifact but is normal in caliber without evidence of an aneurysm. Ovoid 9 mm nodule is suspected in the right thyroid lobe on axial image 8, this is partially obscured by streak artifact but does not appear to be significantly changed. The bone windows demonstrate no acute abnormalities. Old healed fracture is noted involving the right seventh rib. The CT abdomen/pelvis study will be reported separately.     1. Bilateral pulmonary nodules are noted. 1 of the nodules which is located in the left lower lobe appears larger compared to the prior study and measures 5-6 mm. Previously it measured 3 mm. Although this could be due at least in part to differences in slice selection, neoplastic disease is questioned. Short-term follow-up is advised. 2. Additional findings are detailed above. This document has been electronically signed by: Isaias Sakai, M.D. on 07/23/2021 10:53 PM All CTs at this  facility use dose modulation techniques and iterative reconstructions, and/or weight-based dosing when appropriate to reduce radiation to a low as reasonably achievable.    MRI LUMBAR SPINE W WO CONTRAST    Result Date: 07/25/2021  PROCEDURE: MRI LUMBAR SPINE W WO CONTRAST CLINICAL INFORMATION: evaluate L2 vertebral body neoplastic disease, history of RCC, new intracranial tumors. COMPARISON: CT abdomen and pelvis 07/23/2021. TECHNIQUE: Sagittal and axial T1 and T2-weighted images were obtained to the lumbar spine. Postcontrast axial and sagittal T1-weighted images were also obtained. FINDINGS: The lumbar vertebral bodies are normally aligned.  There is a 8 mm small indeterminate lesion in L2. This corresponds to the finding on CT. There does appear to be some central enhancement. This could represent a small metastasis but is indeterminate. There are no other bone lesions. There is some edema associated with facet degenerative changes at the L4-5 level. There is some associated enhancement which appears degenerative in nature.   There are no compression fractures.  No pars defects are noted.  There is mild disc desiccation at the L3-4 and L5-S1 levels.  The visualized aspects of the distal spinal cord are normal. The nerve roots of the cauda equina and the tip of the conus are normal. There are no gross abnormalities in the distal thoracic spine. On the axial images, at T12-L1 through L2-3, there are no degenerative changes. There is no spinal canal or foraminal stenosis. At L3-4, there are  mild facet degenerative changes. There is no spinal canal stenosis. There is no foraminal stenosis. At L4-5, there are mild facet degenerative changes. There is no focal disc abnormality. There is no spinal canal stenosis. There is no foraminal stenosis. At L5-S1, there are mild facet degenerative changes. There is a diffuse disc bulge. There is mild narrowing of the left subarticular recess. There is no spinal canal stenosis. There  is moderate severity right and mild left foraminal stenosis. There is no abnormal enhancement within the spinal canal. There are no suspicious findings in the visualized aspects of the retroperitoneum and paraspinal soft tissues.      1. 8 mm indeterminate lesion in L2. This could represent a small metastasis. Other etiologies are not excluded. 2. No evidence of tumor within the spinal canal. 3. Moderate severity right and mild left foraminal stenosis at the L5-S1 level due to degenerative changes. **This report has been created using voice recognition software. It may contain minor errors which are inherent in voice recognition technology.** Final report electronically signed by Dr. Carmie End on 07/25/2021 6:06 AM    MRI ABDOMEN W WO CONTRAST    Result Date: 07/24/2021  PROCEDURE: MRI ABDOMEN W WO CONTRAST CLINICAL INFORMATION: evaluate liver enhancing lesion, history of RCC, new intracranial tumors COMPARISON: CT abdomen and pelvis 07/23/2021 TECHNIQUE: Multisequence and multiplanar MRI of the abdomen was performed without and with  contrast. T1 and T2 contrast information were emphasized. CONTRAST: 20  mL of ProHance  intravenously. FINDINGS: Motion artifacts degrade images. LOWER THORAX: No significant lower thoracic findings. HEPATOBILIARY: No focal hepatic mass.  Unremarkable hepatic surface morphology. The gallbladder, and biliary tree are unremarkable. PANCREAS AND SPLEEN: The pancreas is markedly atrophic. No peripancreatic abnormality.  The spleen is not enlarged RETROPERITONEUM: The left kidney is surgically absent. No residual mass is seen in the postoperative bed. The left adrenal gland is also surgically absent. The right adrenal gland is unremarkable. No hydronephrosis. Tiny right renal cyst is seen. BOWEL AND PERITONEUM:  No bowel obstruction or acute inflammatory bowel process.  VASCULAR: The abdominal aorta is not aneurysmal. The main portal vein, splenic vein and superior mesenteric vein are patent.  LYMPH NODES: No significantly enlarged lymph nodes are seen. BONES: No acute bony abnormality. OTHER: None.     1. No abnormal enhancing hepatic mass is seen. 2. Status post left nephrectomy. No residual or recurrent disease in the surgical bed. **This report has been created using voice recognition software.  It may contain minor errors which are inherent in voice recognition technology.** Final report electronically signed by Dr Varney Biles on 07/24/2021 9:21 PM    CT ABDOMEN PELVIS W IV CONTRAST Additional Contrast? None    Result Date: 07/23/2021  CT ABDOMEN AND PELVIS WITH CONTRAST COMPARISON: 02/27/2021. FINDINGS: A few enhancing lesions are noted within the liver. These lesions are better seen on the CT chest study, for example located in the right hepatic lobe, measuring 7-8 mm on axial image 46. Another faint enhancing lesion located more inferiorly in the right hepatic lobe measures 6-7 mm on axial image 58. The gallbladder is unremarkable. The stomach is significantly underdistended which precludes accurate assessment. A small hiatal hernia is noted. The pancreas and spleen are unremarkable. Right adrenal gland is unremarkable. Subcentimeter low-attenuation lesion is again noted within the right kidney which is too small to characterize. A subcentimeter stone is noted in the upper pole of the right kidney, without hydronephrosis. Postoperative changes of a prior left-sided nephrectomy are again  noted. Abdominal aorta is normal in caliber, without evidence of an aneurysm or dissection. 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 There is no evidence of a small bowel obstruction or free air. There is no pericolonic inflammation. Urinary bladder is underdistended. Fat-containing inguinal hernias are again noted. The bone windows demonstrate a subcentimeter sclerotic lesion within the L2 vertebral body level on coronal image 44, this is new since the prior  study. Chronic mild loss of height of the L5 vertebral body level is again noted.     1. A few subcentimeter enhancing lesions are noted within the liver, these are better seen on the CT chest study performed on the same day. Neoplastic disease will need to be excluded. Follow-up nonurgent liver MRI, without and with contrast, is advised. 2. A subcentimeter sclerotic lesion is noted within the L2 vertebral body which is new since the prior study. This is also suspicious for neoplastic disease. 3. Additional findings are detailed above. This document has been electronically signed by: Isaias Sakai, M.D. on 07/23/2021 11:08 PM All CTs at this facility use dose modulation techniques and iterative reconstructions, and/or weight-based dosing when appropriate to reduce radiation to a low as reasonably achievable.    XR CHEST PORTABLE    Result Date: 07/23/2021  PROCEDURE: XR CHEST PORTABLE CLINICAL INFORMATION: cough COMPARISON: None TECHNIQUE: AP portable chest radiograph performed. FINDINGS: No focal pulmonary consolidation. Cardiac silhouette is not enlarged. No pleural effusion. No pneumothorax. No acute bony abnormality.     1. No acute cardiopulmonary finding. **This report has been created using voice recognition software.  It may contain minor errors which are inherent in voice recognition technology.** Final report electronically signed by Dr Varney Biles on 07/23/2021 6:26 PM    MRI BRAIN W WO CONTRAST    Result Date: 07/24/2021  PROCEDURE: MRI BRAIN W [place] INFORMATIONEvaluate brain massesaltered mental status. COMPARISON: Head CT 07/23/2021. TECHNIQUE: Multiplanar and multiple spin echo T1 and T2-weighted images were obtained through the brain before and after the administration of intravenous contrast. Postcontrast images were obtained according to the [facility] protocol. FINDINGS: In the posterior left frontal lobe there is a 4.4 x 4.3 x 3.2 cm heterogeneously enhancing intra-axial mass. This has  lobulated margins. There is some central necrosis. There is some associated susceptibility artifact consistent with some associated blood products. In the right temporal occipital location, there is a 4.3 x 3.5 x 4.1 cm intra-axial mass. This has thick heterogeneous enhancement and lobulated margins. There are associated blood products. In the inferior right occipital lobe there is a lobulated intra-axial mass versus 2 adjacent masses which abut one another. This measures 1.7 x 1.6 x 1.1 cm. This has lobulated margins and relatively uniform enhancement. There are associated blood products. On the FLAIR sequence, there is a moderate amount of abnormal signal surrounding each of the masses consistent with surrounding vasogenic edema. On the right, this involves portions of the occipital lobe, temporal lobe and extends into the right basal ganglia. This also extends into the right parietal lobe. On the left, this extends throughout the left frontal lobe. There is no restricted diffusion to suggest an acute infarct. The brain volume is mildly reduced.. There are no extra-axial collections.  There is mass effect upon the temporal horn and occipital horn the right lateral ventricle. There is some mild mass effect upon the body of the left lateral ventricle. There is slight midline shift to the left. There is no  hydrocephalus. On the FLAIR and T2-weighted sequences, there is mild signal hyperintensity scattered in the white matter of the brain. This is most consistent with mild severity chronic small vessel ischemic changes. On the gradient echo T2-weighted images, there is susceptibility artifact associated with each of the intra-axial masses consistent with blood products.  The major intracranial vascular flow voids are present.  The midline craniocervical junction structures are normal.  The brainstem and pituitary gland are normal. The midline frontal sinus is opacified. There is mucosal thickening in the mastoid air  cells.      1. Lobulated intra-axial 3 masses and associated blood products consistent with hemorrhage. This is most consistent with metastatic disease. 2. Surrounding vasogenic edema with slight midline shift to the left. 3. Mild severity chronic small vessel ischemic changes. **This report has been created using voice recognition software. It may contain minor errors which are inherent in voice recognition technology.** Final report electronically signed by Dr. 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 *        Plan: Patient is seen and evaluated in the ICU setting postoperative day #4 from a subtotal craniotomy and safe maximal resection of occipital brain lesions as performed by Dr. Gardenia Phlegm, without complication.  Patient is observed sitting in a chair comfortably with pain well controlled.  Dressings are intact over flat dry incision and the surgical drain was intact and functioning approximately 30 cc per shift.  Patient remains otherwise stable and intact neurologically to his baseline with no additional significant changes noted.  In anticipation of additional surgical intervention in the form of left frontal craniotomy for safe maximal resection of left frontal brain lesion, scheduled for tomorrow.  Patient will be placed n.p.o. midnight in anticipation of tomorrow's procedure.  Surgery to follow      Electronically signed by Brayton El, PA-C on 07/30/2021 at 7:20 AM    Neurosurgery

## 2021-07-30 NOTE — Progress Notes (Signed)
[facility]  [facility] CVICU 4B  Occupational Therapy  Daily Note  Time:   Time In: 0832  Time Out: 0855  Timed Code Treatment Minutes: 23 Minutes  Minutes: 23          Date: 07/30/2021  Patient Name: Marcus Burnett,   Gender: male      Room: 4B-01/001-A  MRN: [id number]  DOB: [birth year]  (61 y.o.)  Referring Practitioner: Honor Loh, APRN-CNP  Diagnosis: neoplasm of brain causing mass effect on adjacent structures.  Additional Pertinent Hx: per chart review; "Eran Mistry is a 61 y.o. male who presents for evaluation. Apparently for the past 6 weeks patient has had a decline in his mental health and cognitive skills.Patient worked at [employer] and had a very stressful job.  They both attributed development of depression to job stress.  His behavior changed and slowed. His gait is different and he has blank stare on his face at times.  Patient has trouble communicating his thoughts. 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  Wife was told that "they got everything."  Her and her husband were aware that this type of cancer could spread to lung and brain however they were not informed that there was any metastasis. RIGHT SIDED CRANIOTOMY FOR RESECTION OF OCCIPITAL TUMOR AND OCCIPITAL TEMPORAL TUMOR on 07/26/21    Restrictions/Precautions:  Restrictions/Precautions: General Precautions, Fall Risk, Surgical Protocols     SUBJECTIVE: Pt sitting in the recliner upon arrival, pt agreeable to OT session, RN gave verbal approval for session     PAIN: 0/10:     Vitals: Blood Pressure: 162/87  Oxygen: 94%  Heart Rate: 64    COGNITION: Slow Processing    ADL:   Upper Extremity Dressing: Moderate Assistance.  With threading RUE into sleeve and over head   Lower Extremity Dressing: Minimal Assistance.  With donning pants in the back and for tying  Footwear Management: Stand By Assistance.  For donning socks with cross over method .    BALANCE:  Standing Balance: Special educational needs teacher. With RW, and BUE release from the walker with pt noted to be impulsive     BED MOBILITY:  Not Tested    TRANSFERS:  Sit to Stand:  Furniture conservator/restorer. From the recliner  Stand to Sit: Furniture conservator/restorer. Back to the recliner    FUNCTIONAL MOBILITY:  Assistive Device: Conservation officer, nature  Assist Level:  Furniture conservator/restorer.   Distance:  HH distances with vc's for safety due to pt impulsive    ASSESSMENT:     Activity Tolerance:  Patient tolerance of  treatment: good.       Discharge Recommendations: Inpatient Rehabilitation  Equipment Recommendations: Equipment Needed: No  Plan: Times Per Week: 5x  Times Per Day: Once a day  Current Treatment Recommendations: Strengthening, Balance training, Functional mobility training, Endurance training, Safety education & training, Neuromuscular re-education, Patient/Caregiver education & training, Self-Care / ADL, Coordination training    Patient Education  Patient Education: ADL's and Precautions    Goals  Short Term Goals  Time Frame for Short Term Goals: by discharge  Short Term Goal 1: patient will tolerate standing >10 minutes with supervision in prep for ADL tasks.  Short Term Goal 2: patient will functionally ambulate house hold distances with (S) with 0-1 cues for safety and sequencing.  Short Term Goal 3: patient will complete ADL routine with (S) with 0-1 cues for safety awareness.  Short  Term Goal 4: patient will participate in moderate resistive UB Exer to increase UB strength for functional transfers.    Following session, patient left in safe position with all fall risk precautions in place.

## 2021-07-30 NOTE — Care Coordination-Inpatient (Signed)
07/30/21, 8:32 AM EST    DISCHARGE ON GOING EVALUATION    [facility] day: 7  Location: 4B-01/001-A Reason for admit: Neoplasm of brain causing mass effect on adjacent structures ([place]) [D49.6, G93.5]  Altered mental status, unspecified altered mental status type [R41.82]   Procedure:   2/6 CT Head: A left frontal lobe mass and a left parietotemporal lobe mass are noted as described above. There is midline shift to the left at the level of the septum pellucidum related to subfalcine herniation. Differential diagnoses would include metastatic disease, glioblastoma multiforme, abscess amongst other possibilities  2/6 CT Chest: Bilateral pulmonary nodules are noted. 1 of the nodules which is located in the left lower lobe appears larger compared to the prior study and measures 5-6 mm. Previously it measured 3 mm. Although this could be   due at least in part to differences in slice selection, neoplastic disease is questioned. Short-term follow-up is advised  2/6 CT Abd/pelvis: A few subcentimeter enhancing lesions are noted within the liver, these are better seen on the CT chest study performed on the same day. Neoplastic disease will need to be excluded. Follow-up nonurgent liver   MRI, without and with contrast, is advised; A subcentimeter sclerotic lesion is noted within the L2 vertebral body   which is new since the prior study. This is also suspicious for neoplastic disease.  2/6 CXR: No acute process  2/7 MRI Brain: Lobulated intra-axial 3 masses and associated blood products consistent with hemorrhage. This is most consistent with metastatic disease; Surrounding vasogenic edema with slight midline shift to the left; Mild severity chronic small vessel ischemic changes.  2/7 MRI Abdomen: No abnormal enhancing hepatic mass is seen; Status post left nephrectomy. No residual or recurrent disease in the surgical bed  2/7 Echo with EF 60%  2/7 PICC right basilic  2/8 MRI Lumbar spine: 8 mm  indeterminate lesion in L2. This could represent a small metastasis. Other etiologies are not excluded; No evidence of tumor within the spinal canal; Moderate severity right and mild left foraminal stenosis at the L5-S1 level due to degenerative changes  2/8 CT Head: Preoperative planning for the hemorrhagic masses in the left frontal lobe, right temporal lobe and right occipital lobe  2/9 RIGHT SIDED CRANIOTOMY FOR RESECTION OF OCCIPITAL TUMOR AND OCCIPITAL TEMPORAL TUMOR  2/9 CT Head: represent metastases from renal carcinoma; Large mass in the left frontal lobe measuring 3.6 x 3.3 cm in size with hemorrhage and surrounding edema; Area of encephalomalacia in the right frontal cortex; Probable ischemic changes in the white matter  2/10 CT Head: Stable  2/11 MRI Brain W WO Contrast 1. Status post resection of the right-sided metastases. There is a small amount of acute infarction along the resection cavity in the posterior right temporal lobe.     2. Stable left frontal lobe mass.     Barriers to Discharge: diabetes management, PT/OT/ST, IV Ancef, Keppra, Pravachol, Neurosurgery following.  PCP: [name], MD  Readmission Risk Score: 10.4%  Patient Goals/Plan/Treatment Preferences:  From home w/wife. Plan pending clinical course; monitor therapy evals for recommendations. Plan for another surgery on Tuesday 2/14.

## 2021-07-30 NOTE — Plan of Care (Signed)
Problem: Safety - Adult  Goal: Free from fall injury  Outcome: Progressing  Flowsheets (Taken 07/30/2021 0933)  Free From Fall Injury:   Instruct family/caregiver on patient safety   Based on caregiver fall risk screen, instruct family/caregiver to ask for assistance with transferring infant if caregiver noted to have fall risk factors     Problem: Skin/Tissue Integrity  Goal: Absence of new skin breakdown  Description: 1.  Monitor for areas of redness and/or skin breakdown  2.  Assess vascular access sites hourly  3.  Every 4-6 hours minimum:  Change oxygen saturation probe site  4.  Every 4-6 hours:  If on nasal continuous positive airway pressure, respiratory therapy assess nares and determine need for appliance change or resting period.  Outcome: Progressing     Problem: Anxiety  Goal: Will report anxiety at manageable levels  Description: INTERVENTIONS:  1. Administer medication as ordered  2. Teach and rehearse alternative coping skills  3. Provide emotional support with 1:1 interaction with staff  Outcome: Progressing  Flowsheets (Taken 07/30/2021 0800)  Will report anxiety at manageable levels:   Administer medication as ordered   Teach and rehearse alternative coping skills
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
## 2021-07-31 ENCOUNTER — Inpatient Hospital Stay: Admit: 2021-07-31 | Payer: BLUE CROSS/BLUE SHIELD | Primary: Family Medicine

## 2021-07-31 LAB — SODIUM
Sodium: 140 meq/L (ref 135–145)
Sodium: 141 meq/L (ref 135–145)
Sodium: 139 meq/L (ref 135–145)
Sodium: 142 meq/L (ref 135–145)

## 2021-07-31 LAB — CBC WITH AUTO DIFFERENTIAL
Basophils Absolute: 0 10*3/uL (ref 0.0–0.1)
Basophils: 0.1 %
Eosinophils Absolute: 0.4 10*3/uL (ref 0.0–0.4)
Eosinophils: 4.5 %
Hematocrit: 36.1 % — ABNORMAL LOW (ref 42.0–52.0)
Hemoglobin: 12.1 gm/dl — ABNORMAL LOW (ref 14.0–18.0)
Immature Grans (Abs): 0.08 10*3/uL — ABNORMAL HIGH (ref 0.00–0.07)
Immature Granulocytes: 0.8 %
Lymphocytes Absolute: 2.2 10*3/uL (ref 1.0–4.8)
Lymphocytes: 22.5 %
MCH: 29.1 pg (ref 26.0–33.0)
MCHC: 33.5 gm/dl (ref 32.2–35.5)
MCV: 86.8 fL (ref 80.0–94.0)
MPV: 10.8 fL (ref 9.4–12.4)
Monocytes Absolute: 0.7 10*3/uL (ref 0.4–1.3)
Monocytes: 7.7 %
Platelets: 294 10*3/uL (ref 130–400)
RBC: 4.16 10*6/uL — ABNORMAL LOW (ref 4.70–6.10)
RDW-CV: 14 % (ref 11.5–14.5)
RDW-SD: 43.8 fL (ref 35.0–45.0)
Seg Neutrophils: 64.4 %
Segs Absolute: 6.2 10*3/uL (ref 1.8–7.7)
WBC: 9.6 10*3/uL (ref 4.8–10.8)
nRBC: 0 /100 wbc

## 2021-07-31 LAB — BASIC METABOLIC PANEL
BUN: 22 mg/dL (ref 7–22)
CO2: 25 meq/L (ref 23–33)
Calcium: 8.9 mg/dL (ref 8.5–10.5)
Chloride: 108 meq/L (ref 98–111)
Creatinine: 1 mg/dL (ref 0.4–1.2)
Glucose: 168 mg/dL — ABNORMAL HIGH (ref 70–108)
Potassium: 4.2 meq/L (ref 3.5–5.2)
Sodium: 142 meq/L (ref 135–145)

## 2021-07-31 LAB — ANION GAP: Anion Gap: 9 meq/L (ref 8.0–16.0)

## 2021-07-31 LAB — BLOOD GAS, ARTERIAL
Base Excess (Calculated): -0.9 mmol/l (ref <=2.5)
COLLECTED BY:: 97668
HCO3: 23 mmol/l (ref 23–28)
O2 Sat: 99 %
PCO2: 36 mmhg (ref 35–45)
PO2: 119 mmhg — ABNORMAL HIGH (ref 71–104)
pH, Blood Gas: 7.42 (ref 7.35–7.45)

## 2021-07-31 LAB — GLOMERULAR FILTRATION RATE, ESTIMATED: Est, Glom Filt Rate: >60 mL/min/{1.73_m2} (ref >60)

## 2021-07-31 LAB — POCT GLUCOSE
POC Glucose: 181 mg/dl — ABNORMAL HIGH (ref 70–108)
POC Glucose: 120 mg/dl — ABNORMAL HIGH (ref 70–108)

## 2021-07-31 LAB — GLUCOSE, WHOLE BLOOD

## 2021-07-31 LAB — POCT HEMOGLOBIN: Hemoglobin: 10.1 g/dl — ABNORMAL LOW (ref 14.0–18.0)

## 2021-07-31 LAB — POTASSIUM, WHOLE BLOOD: Potassium, Whole Blood: 3.8 meq/l (ref 3.5–4.9)

## 2021-07-31 LAB — CALCIUM IONIZED SERUM: Calcium,Ionized: 1.18 mmol/L (ref 1.12–1.32)

## 2021-07-31 LAB — SODIUM, WHOLE BLOOD: Sodium, Whole Blood: 142 meq/l (ref 138–146)

## 2021-07-31 MED ORDER — HYDROMORPHONE HCL 2 MG/ML IJ SOLN
2 MG/ML | INTRAMUSCULAR | Status: DC | PRN
Start: 2021-07-31 — End: 2021-07-31
  Administered 2021-07-31 (×4): .5 via INTRAVENOUS

## 2021-07-31 MED ORDER — ONDANSETRON HCL 4 MG/2ML IJ SOLN
4 MG/2ML | INTRAMUSCULAR | Status: AC
Start: 2021-07-31 — End: ?

## 2021-07-31 MED ORDER — ROCURONIUM BROMIDE 50 MG/5ML IV SOLN
50 MG/5ML | INTRAVENOUS | Status: AC
Start: 2021-07-31 — End: ?

## 2021-07-31 MED ORDER — PHENYLEPHRINE HCL 10 MG/ML SOLN (MIXTURES ONLY)
10 MG/ML | Status: DC | PRN
Start: 2021-07-31 — End: 2021-07-31
  Administered 2021-07-31 (×3): 50 via INTRAVENOUS
  Administered 2021-07-31: 17:00:00 100 via INTRAVENOUS
  Administered 2021-07-31 (×2): 50 via INTRAVENOUS
  Administered 2021-07-31: 18:00:00 100 via INTRAVENOUS

## 2021-07-31 MED ORDER — LACTATED RINGERS IV SOLN
INTRAVENOUS | Status: DC | PRN
Start: 2021-07-31 — End: 2021-07-31
  Administered 2021-07-31 (×2): via INTRAVENOUS

## 2021-07-31 MED ORDER — LIDOCAINE (CARDIAC) 100 MG/5ML IV SOLN (MIXTURES ONLY)
100 MG/5ML | INTRAVENOUS | Status: AC
Start: 2021-07-31 — End: ?

## 2021-07-31 MED ORDER — MIDAZOLAM HCL 2 MG/2ML IJ SOLN
2 MG/ML | INTRAMUSCULAR | Status: AC
Start: 2021-07-31 — End: ?

## 2021-07-31 MED ORDER — DEXAMETHASONE SOD PHOSPHATE PF 10 MG/ML IJ SOSY
10 MG/ML | INTRAMUSCULAR | Status: AC
Start: 2021-07-31 — End: ?

## 2021-07-31 MED ORDER — GLYCOPYRROLATE 0.2 MG/ML IJ SOLN
0.2 MG/ML | INTRAMUSCULAR | Status: DC | PRN
Start: 2021-07-31 — End: 2021-07-31
  Administered 2021-07-31: 13:00:00 .2 via INTRAVENOUS

## 2021-07-31 MED ORDER — HYDROMORPHONE HCL 2 MG/ML IJ SOLN
2 MG/ML | INTRAMUSCULAR | Status: AC
Start: 2021-07-31 — End: ?

## 2021-07-31 MED ORDER — GLYCOPYRROLATE 0.2 MG/ML IJ SOLN
0.2 MG/ML | INTRAMUSCULAR | Status: AC
Start: 2021-07-31 — End: ?

## 2021-07-31 MED ORDER — ROCURONIUM BROMIDE 50 MG/5ML IV SOLN
50 MG/5ML | INTRAVENOUS | Status: DC | PRN
Start: 2021-07-31 — End: 2021-07-31
  Administered 2021-07-31: 13:00:00 20 via INTRAVENOUS
  Administered 2021-07-31: 13:00:00 50 via INTRAVENOUS

## 2021-07-31 MED ORDER — PROPOFOL 200 MG/20ML IV EMUL
200 MG/20ML | INTRAVENOUS | Status: DC | PRN
Start: 2021-07-31 — End: 2021-07-31
  Administered 2021-07-31: 13:00:00 150 via INTRAVENOUS
  Administered 2021-07-31: 14:00:00 140 via INTRAVENOUS

## 2021-07-31 MED ORDER — PROPOFOL 200 MG/20ML IV EMUL
200 MG/20ML | INTRAVENOUS | Status: AC
Start: 2021-07-31 — End: ?

## 2021-07-31 MED ORDER — SUGAMMADEX SODIUM 200 MG/2ML IV SOLN
200 MG/2ML | INTRAVENOUS | Status: DC | PRN
Start: 2021-07-31 — End: 2021-07-31
  Administered 2021-07-31: 14:00:00 200 via INTRAVENOUS

## 2021-07-31 MED ORDER — FENTANYL CITRATE (PF) 100 MCG/2ML IJ SOLN
100 MCG/2ML | INTRAMUSCULAR | Status: AC
Start: 2021-07-31 — End: ?

## 2021-07-31 MED ORDER — SODIUM CHLORIDE 0.9 % IV SOLN
0.9 | Freq: Two times a day (BID) | INTRAVENOUS | Status: AC
Start: 2021-07-31 — End: 2021-07-31

## 2021-07-31 MED ORDER — SODIUM CHLORIDE 0.9 % IV SOLN
0.9 | Freq: Two times a day (BID) | INTRAVENOUS | Status: DC
Start: 2021-07-31 — End: 2021-07-31

## 2021-07-31 MED ORDER — CEFAZOLIN SODIUM 1 G IJ SOLR
1 g | INTRAMUSCULAR | Status: AC
Start: 2021-07-31 — End: ?

## 2021-07-31 MED ORDER — MANNITOL 25 % IV SOLN
25 % | INTRAVENOUS | Status: AC
Start: 2021-07-31 — End: ?

## 2021-07-31 MED ORDER — CEFAZOLIN SODIUM 1 G IJ SOLR
1 g | INTRAMUSCULAR | Status: DC | PRN
Start: 2021-07-31 — End: 2021-07-31
  Administered 2021-07-31 (×2): 2 via INTRAVENOUS

## 2021-07-31 MED ORDER — SODIUM CHLORIDE 0.9 % IV SOLN
0.9 % | INTRAVENOUS | Status: DC | PRN
Start: 2021-07-31 — End: 2021-07-31
  Administered 2021-07-31: 13:00:00 via INTRAVENOUS

## 2021-07-31 MED ORDER — SUGAMMADEX SODIUM 200 MG/2ML IV SOLN
200 MG/2ML | INTRAVENOUS | Status: AC
Start: 2021-07-31 — End: ?

## 2021-07-31 MED ORDER — MANNITOL 20 % IV SOLN
20 % | INTRAVENOUS | Status: AC
Start: 2021-07-31 — End: ?

## 2021-07-31 MED ORDER — LIDOCAINE (CARDIAC) 100 MG/5ML IV SOLN (MIXTURES ONLY)
100 MG/5ML | INTRAVENOUS | Status: DC | PRN
Start: 2021-07-31 — End: 2021-07-31
  Administered 2021-07-31: 13:00:00 100 via INTRAVENOUS

## 2021-07-31 MED ORDER — FENTANYL CITRATE (PF) 100 MCG/2ML IJ SOLN
100 MCG/2ML | INTRAMUSCULAR | Status: DC | PRN
Start: 2021-07-31 — End: 2021-07-31
  Administered 2021-07-31 (×2): 50 via INTRAVENOUS

## 2021-07-31 MED ORDER — MIDAZOLAM HCL 2 MG/2ML IJ SOLN
2 MG/ML | INTRAMUSCULAR | Status: DC | PRN
Start: 2021-07-31 — End: 2021-07-31
  Administered 2021-07-31: 13:00:00 2 via INTRAVENOUS

## 2021-07-31 MED ORDER — PROPOFOL 500 MG/50ML IV EMUL
500 MG/50ML | INTRAVENOUS | Status: AC
Start: 2021-07-31 — End: ?

## 2021-07-31 MED ORDER — BACITRACIN 500 UNIT/GM EX OINT
500 UNIT/GM | CUTANEOUS | Status: AC
Start: 2021-07-31 — End: ?

## 2021-07-31 MED ORDER — TRANEXAMIC ACID 1000 MG/10ML IV SOLN
1000 MG/10ML | INTRAVENOUS | Status: DC | PRN
Start: 2021-07-31 — End: 2021-07-31
  Administered 2021-07-31: 14:00:00 1000 via INTRAVENOUS

## 2021-07-31 MED ORDER — ONDANSETRON HCL 4 MG/2ML IJ SOLN
4 MG/2ML | INTRAMUSCULAR | Status: DC | PRN
Start: 2021-07-31 — End: 2021-07-31
  Administered 2021-07-31: 18:00:00 4 via INTRAVENOUS

## 2021-07-31 MED ORDER — TRANEXAMIC ACID 1000 MG/10ML IV SOLN
1000 MG/10ML | INTRAVENOUS | Status: AC
Start: 2021-07-31 — End: ?

## 2021-07-31 MED ORDER — MANNITOL 20 % IV SOLN
20 % | INTRAVENOUS | Status: DC | PRN
Start: 2021-07-31 — End: 2021-07-31
  Administered 2021-07-31: 14:00:00 50 via INTRAVENOUS

## 2021-07-31 MED ORDER — DEXAMETHASONE SOD PHOSPHATE PF 10 MG/ML IJ SOSY
10 MG/ML | INTRAMUSCULAR | Status: DC | PRN
Start: 2021-07-31 — End: 2021-07-31
  Administered 2021-07-31: 13:00:00 10 via INTRAVENOUS

## 2021-07-31 MED ORDER — BACITRACIN 500 UNIT/GM EX OINT
500 UNIT/GM | CUTANEOUS | Status: DC | PRN
Start: 2021-07-31 — End: 2021-07-31
  Administered 2021-07-31: 15:00:00 1 via TOPICAL

## 2021-07-31 MED FILL — LEVETIRACETAM 500 MG PO TABS: 500 MG | ORAL | Qty: 1

## 2021-07-31 MED FILL — PRAVASTATIN SODIUM 80 MG PO TABS: 80 MG | ORAL | Qty: 1

## 2021-07-31 MED FILL — CEFAZOLIN SODIUM 1 G IJ SOLR: 1 g | INTRAMUSCULAR | Qty: 1000

## 2021-07-31 MED FILL — LISINOPRIL 40 MG PO TABS: 40 MG | ORAL | Qty: 1

## 2021-07-31 MED FILL — HYDROCODONE-ACETAMINOPHEN 5-325 MG PO TABS: 5-325 MG | ORAL | Qty: 1

## 2021-07-31 MED FILL — MANNITOL 25 % IV SOLN: 25 % | INTRAVENOUS | Qty: 50

## 2021-07-31 MED FILL — BACITRACIN 500 UNIT/GM EX OINT: 500 UNIT/GM | CUTANEOUS | Qty: 28

## 2021-07-31 MED FILL — ROCURONIUM BROMIDE 50 MG/5ML IV SOLN: 50 MG/5ML | INTRAVENOUS | Qty: 5

## 2021-07-31 MED FILL — OSMITROL 20 % IV SOLN: 20 % | INTRAVENOUS | Qty: 250

## 2021-07-31 MED FILL — SODIUM CHLORIDE 3 % IV SOLN: 3 % | INTRAVENOUS | Qty: 500

## 2021-07-31 MED FILL — MIDAZOLAM HCL 2 MG/2ML IJ SOLN: 2 MG/ML | INTRAMUSCULAR | Qty: 2

## 2021-07-31 MED FILL — BRIDION 200 MG/2ML IV SOLN: 200 MG/2ML | INTRAVENOUS | Qty: 2

## 2021-07-31 MED FILL — FENTANYL CITRATE (PF) 100 MCG/2ML IJ SOLN: 100 MCG/2ML | INTRAMUSCULAR | Qty: 2

## 2021-07-31 MED FILL — CEFAZOLIN SODIUM 10 G IJ SOLR: 10 g | INTRAMUSCULAR | Qty: 2

## 2021-07-31 MED FILL — CEFAZOLIN 2000 MG IN NS 50 ML IVPB: Qty: 50

## 2021-07-31 MED FILL — PROPOFOL 200 MG/20ML IV EMUL: 200 MG/20ML | INTRAVENOUS | Qty: 20

## 2021-07-31 MED FILL — TRANEXAMIC ACID 1000 MG/10ML IV SOLN: 1000 MG/10ML | INTRAVENOUS | Qty: 20

## 2021-07-31 MED FILL — PROPOFOL 500 MG/50ML IV EMUL: 500 MG/50ML | INTRAVENOUS | Qty: 50

## 2021-07-31 MED FILL — LEVETIRACETAM 500 MG/5ML IV SOLN: 500 MG/5ML | INTRAVENOUS | Qty: 10

## 2021-07-31 MED FILL — AMLODIPINE BESYLATE 10 MG PO TABS: 10 MG | ORAL | Qty: 1

## 2021-07-31 MED FILL — PROPOFOL 500 MG/50ML IV EMUL: 500 MG/50ML | INTRAVENOUS | Qty: 300

## 2021-07-31 MED FILL — LANTUS 100 UNIT/ML SC SOLN: 100 UNIT/ML | SUBCUTANEOUS | Qty: 10

## 2021-07-31 MED FILL — DEXAMETHASONE SOD PHOSPHATE PF 10 MG/ML IJ SOSY: 10 MG/ML | INTRAMUSCULAR | Qty: 1

## 2021-07-31 MED FILL — HYDROMORPHONE HCL 2 MG/ML IJ SOLN: 2 MG/ML | INTRAMUSCULAR | Qty: 1

## 2021-07-31 MED FILL — ONDANSETRON HCL 4 MG/2ML IJ SOLN: 4 MG/2ML | INTRAMUSCULAR | Qty: 2

## 2021-07-31 MED FILL — LIDOCAINE HCL (CARDIAC) PF 100 MG/5ML IV SOSY: 100 MG/5ML | INTRAVENOUS | Qty: 5

## 2021-07-31 MED FILL — GLYCOPYRROLATE 0.2 MG/ML IJ SOLN: 0.2 MG/ML | INTRAMUSCULAR | Qty: 1

## 2021-07-31 NOTE — Anesthesia Procedure Notes (Signed)
Arterial Line:    An arterial line was placed using surface landmarks, in the OR for the following indication(s): continuous blood pressure monitoring.    A 20 gauge (size), 1 inch (length), Arrow (type) catheter was placed, Seldinger technique used, into the right radial artery, secured by Tegaderm and tape.  Anesthesia type: General    Events:  EBL < 38mL.07/31/2021 7:50 AM2/14/2023 7:57 AM  Anesthesiologist: Gildardo Pounds Heitmeyer, DO  Performed: Anesthesiologist   Preanesthetic Checklist  Completed: patient identified, IV checked, site marked, risks and benefits discussed, surgical/procedural consents, equipment checked, pre-op evaluation, timeout performed, anesthesia consent given, oxygen available, monitors applied/VS acknowledged, fire risk safety assessment completed and verbalized and blood product R/B/A discussed and consented

## 2021-07-31 NOTE — Brief Op Note (Signed)
Brief Postoperative Note      Patient: Marcus Burnett  Date of Birth: 1960-06-19  MRN: [id number]    Date of Procedure: 07/31/2021    Pre-Op Diagnosis: Brain tumor ([place]) [D49.6]    Post-Op Diagnosis: Same       Procedure(s):  Left Craniotomy Resection of Tumor    Surgeon(s):  Illene Labrador, MD    Assistant:  Physician Assistant: Brayton El, PA-C    Anesthesia: General    Estimated Blood Loss (mL): 409     Complications: None    Specimens:   ID Type Source Tests Collected by Time Destination   A : Left Side Brain Tumor Tissue Brain [place], MD 07/31/2021 8119        Implants:  Implant Name Type Inv. Item Serial No. Manufacturer Lot No. LRB No. Used Action   PATCH DURAL SUB 2.5X2.5CM ONLAY LYCOPLANT - [id number]  PATCH DURAL SUB 2.5X2.5CM ONLAY LYCOPLANT  [employer]WD [id number] Left 1 Implanted   PLATE BUR H L HYQ65.7QI 5 H TI BENT W/O TAB NONCOMPRESSION - [id number]  PLATE BUR H L [id number] 5 H TI BENT W/O TAB NONCOMPRESSION  [employer]WD  Left 4 Implanted   SCREW 1.5X4MM - [id number]  SCREW 1.5X4MM  [employer] MICROFIXATION-WD  Left 20 Implanted         Drains: JP  Closed/Suction Drain Left Scalp Bulb (Active)       Urinary Catheter 07/31/21 2 Way (Active)       [REMOVED] Closed/Suction Drain Superior Bulb (Removed)   Site Description Clean, dry & intact 07/30/21 0800   Dressing Status Clean, dry & intact 07/30/21 0800   Drainage Appearance Serosanguinous 07/30/21 0800   Drain Status To bulb suction 07/30/21 0800   Output (ml) 10 ml 07/29/21 2324       [REMOVED] Urinary Catheter 07/26/21 Foley-Temperature (Removed)   Catheter Indications Perioperative use for selected surgical procedures 07/27/21 0745   Site Assessment No urethral drainage 07/27/21 0745   Urine Color Yellow 07/27/21 0745   Urine Appearance Clear 07/27/21 0745   Collection Container Standard 07/27/21 0745   Securement Method Securing device (Describe) 07/27/21 0745   Catheter Care  Soap and water 07/26/21 1630   Catheter  Best Practices  Drainage tube clipped to bed;Catheter secured to thigh;Tamper seal intact;Bag below bladder;Bag not on floor;Lack of dependent loop in tubing;Drainage bag less than half full 07/27/21 0745   Status Draining 07/27/21 0745   Output (mL) 300 mL 07/27/21 0745       Findings: Post left frontal parietal craniotomy for safe maximal resection of intercranial lesion    Electronically signed by Brayton El, PA-C on 07/31/2021 at 1:25 PM

## 2021-07-31 NOTE — Progress Notes (Signed)
[facility]  OCCUPATIONAL THERAPY MISSED TREATMENT NOTE  [facility] OR  [facility] OR (General) POOL R*      Date: 07/31/2021  Patient Name: Marcus Burnett        CSN: [id number]   DOB: Feb 27, 1961  (61 y.o.)  Gender: male   Referring Practitioner: Honor Loh, APRN-CNP  Diagnosis: neoplasm of brain causing mass effect on adjacent structures.         REASON FOR MISSED TREATMENT:  Pt off floor for surgery. Will follow up 2/15 as able/appropriate.

## 2021-07-31 NOTE — Progress Notes (Signed)
[place]  PHYSICAL THERAPY MISSED TREATMENT NOTE  [facility] OR    Date: 07/31/2021  Patient Name: Marcus Burnett        MRN: [id number]   DOB: 1960-10-04  (61 y.o.)  Gender: male   Referring Practitioner: Richardson Landry, APRN - CNP  Diagnosis: Neoplasm of brain causing mass effect on adjacent structures ([place])         REASON FOR MISSED TREATMENT:  Patient at testing and/or off unit.  Pt off of the floor at OR for planned Left Craniotomy Resection of Tumor. PT to reevaluate next available date as pt medically appropriate.     Merlene Morse PT, DPT

## 2021-07-31 NOTE — Plan of Care (Signed)
Problem: Discharge Planning  Goal: Discharge to home or other facility with appropriate resources  Outcome: Progressing  Flowsheets (Taken 07/31/2021 1500)  Discharge to home or other facility with appropriate resources:   Identify barriers to discharge with patient and caregiver   Arrange for needed discharge resources and transportation as appropriate   Identify discharge learning needs (meds, wound care, etc)   Arrange for interpreters to assist at discharge as needed     Problem: Safety - Adult  Goal: Free from fall injury  Outcome: Progressing  Flowsheets (Taken 07/30/2021 0933)  Free From Fall Injury:   Instruct family/caregiver on patient safety   Based on caregiver fall risk screen, instruct family/caregiver to ask for assistance with transferring infant if caregiver noted to have fall risk factors     Problem: Skin/Tissue Integrity  Goal: Absence of new skin breakdown  Description: 1.  Monitor for areas of redness and/or skin breakdown  2.  Assess vascular access sites hourly  3.  Every 4-6 hours minimum:  Change oxygen saturation probe site  4.  Every 4-6 hours:  If on nasal continuous positive airway pressure, respiratory therapy assess nares and determine need for appliance change or resting period.  Outcome: Progressing     Problem: ABCDS Injury Assessment  Goal: Absence of physical injury  Outcome: Progressing  Flowsheets (Taken 07/30/2021 0933)  Absence of Physical Injury: Implement safety measures based on patient assessment     Problem: Anxiety  Goal: Will report anxiety at manageable levels  Description: INTERVENTIONS:  1. Administer medication as ordered  2. Teach and rehearse alternative coping skills  3. Provide emotional support with 1:1 interaction with staff  Outcome: Progressing  Flowsheets (Taken 07/31/2021 1500)  Will report anxiety at manageable levels:   Administer medication as ordered   Teach and rehearse alternative coping skills   Provide emotional support with 1:1 interaction with  staff     Problem: Pain  Goal: Verbalizes/displays adequate comfort level or baseline comfort level  Outcome: Progressing  Flowsheets (Taken 07/31/2021 1500)  Verbalizes/displays adequate comfort level or baseline comfort level:   Encourage patient to monitor pain and request assistance   Assess pain using appropriate pain scale   Administer analgesics based on type and severity of pain and evaluate response     Problem: Nutrition Deficit:  Goal: Optimize nutritional status  07/31/2021 1808 by Kerby Nora, RN  Outcome: Progressing  Flowsheets (Taken 07/31/2021 1523 by Donalda Ewings, RD)  Nutrient intake appropriate for improving, restoring, or maintaining nutritional needs:   Assess nutritional status and recommend course of action   Monitor oral intake, labs, and treatment plans   Provide specific nutrition education to patient or family as appropriate   Recommend appropriate diets, oral nutritional supplements, and vitamin/mineral supplements  07/31/2021 1523 by Donalda Ewings, RD  Flowsheets (Taken 07/31/2021 1523)  Nutrient intake appropriate for improving, restoring, or maintaining nutritional needs:   Assess nutritional status and recommend course of action   Monitor oral intake, labs, and treatment plans   Provide specific nutrition education to patient or family as appropriate   Recommend appropriate diets, oral nutritional supplements, and vitamin/mineral supplements

## 2021-07-31 NOTE — Progress Notes (Signed)
Comprehensive Nutrition Assessment    Type and Reason for Visit:  Initial, RD Nutrition Re-Screen/LOS    Nutrition Recommendations/Plan:   Continue diet as ordered and encourage PO intake at best efforts.   Wound healing ONS ordered: Juven BID   Monitoring all nutrition aspects of care and will provide further nutrition recommendations as necessary.      Malnutrition Assessment:  Malnutrition Status:  At risk for malnutrition (Comment) (07/31/21 1511)    Context:  Acute Illness     Findings of the 6 clinical characteristics of malnutrition:  Energy Intake:  No significant decrease in energy intake  Weight Loss:  Unable to assess (limited weight hx to assess)     Body Fat Loss:  No significant body fat loss     Muscle Mass Loss:  No significant muscle mass loss    Fluid Accumulation:  Mild    Grip Strength:  Not Performed    Nutrition Assessment:     Pt. nutritionally compromised AEB increase demand for nutrition to support healing.  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       Nutrition Related Findings:    Pt. Report/Treatments/Miscellaneous: pt returning from OR upon assessment and very lethargic. RN confirms that patient is a great eater and denies need for supplement. However, wound healing supplement ordered as s/p two brain surgeries.   GI Status: last BM 2/12- RN denies GI concerns or complaints   Pertinent Labs: Na 142, K 4.2, BUN 22, creatinine 1, glucose 168, A1C 6.9 ( 07/24/21), hgb 10.1  Pertinent Meds: ancef, keppra      Wound Type:  (2/9: s/p resection of R brain mass. 2/14: s/p L craniotomy resection of brain mass)       Current Nutrition Intake & Therapies:    Average Meal Intake: 76-100% (RN  reports great intake)  Average Supplements Intake:  (wound healing supplement to start)  ADULT DIET; Regular; 4 carb choices (60 gm/meal); Low Fat/Low Chol/High Fiber/2 gm Na; 1500 ml  ADULT ORAL NUTRITION SUPPLEMENT; Breakfast, Dinner; Wound Healing Oral Supplement    Anthropometric Measures:  Height: 5\' 10"  (177.8 cm)  Ideal Body Weight (IBW): 166 lbs (75 kg)    Admission Body Weight: 257 lb 4.4 oz (116.7 kg) (2/6)  Current Body Weight: 258 lb 9.6 oz (117.3 kg) (2/14: generalized trace edema),   IBW. Weight Source: Standing Scale  Current BMI (kg/m2): 37.1  Usual Body Weight:  (limited weight hx available)                       BMI Categories: Obese Class 2 (BMI 35.0 -39.9)    Estimated Daily Nutrient Needs:  Energy Requirements Based On: Kcal/kg  Weight Used for Energy Requirements: Current (2/14: 117.3 kg)  Energy (kcal/day): 1750-2100 kcals/day (15-18 kcals/kg)  Weight Used for Protein Requirements: Ideal  Protein (g/day): 106-120 g/day (1.4-1.6 g/kg IBW)       Nutrition Diagnosis:   Increased nutrient needs related to increase demand for energy/nutrients as evidenced by wounds    Nutrition Interventions:   Food and/or Nutrient Delivery: Continue Current Diet, Start Oral Nutrition Supplement  Nutrition Education/Counseling: No recommendation at this time (pt just returned from surgery)  Coordination of Nutrition Care: Continue to  monitor while inpatient       Goals:     Goals: Meet at least 75% of estimated needs, by next RD assessment       Nutrition Monitoring and Evaluation:      Food/Nutrient Intake Outcomes: Food and Nutrient Intake, Supplement Intake  Physical Signs/Symptoms Outcomes: Biochemical Data, GI Status, Weight, Skin, Nutrition Focused Physical Findings, Meal Time Behavior, Fluid Status or Edema    Discharge Planning:    Too soon to determine     Donalda Ewings, RD  Contact: [phone]

## 2021-07-31 NOTE — Op Note (Signed)
[place]    Patient name: Marcus Burnett  Medical Record Number: [id number]  Account Number: [id number]      Date of Procedure: 07/31/2021    Pre-operative Diagnosis:      Large intra-axial cerebral tumor in left frontal area (metastatic brain tumor)   Multiple brain metastatic disease.  S/P Right posterior temporal and occipital craniotomy and resection of metastatic brain tumor in the right occipital area and in the right posterior temporal area).   Brain edema.  Brain midline shift.     Post-operative Diagnosis: The same     PROCEDURE:        Left frontal craniotomy.  Surgical resection of left frontal intraparenchymal metastatic tumor.  Using the surgical microscope.  Using the intraoperative neuro-navigation.  Utilizing intraoperative brain mapping (cortical and subcortical stimulation) and neurophysiology monitoring (SSPS, MEP, and EEG).   Application of [place] head 3-pins head fixation system.        Anesthesia: General endotracheal     Surgeons/Assistants: Donya Tomaro, MD      Estimated Blood Loss: 200 ml     Drains: 1 JP drain (under the skin).     Blood Transfusions: None      Complications: none immediately appreciated     Specimens: From Left frontal  brain tumor.      Indications for Procedure:      (Please see my initial consultation note and progress notes for more information).     (Please see my separate consultation note and progress note for this patient for more information).  This is 61  -year-old  M who has a history of  renal cell cancer who underwent brain MRI that showed findings  consistent with multiple brain metastatic tumor/disease in left frontal, right posterior temporal and right occipital area.  Patient underwent rRight posterior temporal and occipital craniotomy and resection resection of right posterior temporal and occipital  brain tumors.  Patient did well that surgery.  For this reason, another surgical intervention was recommended in the  form of left-sided craniotomy for maximum safe resection of left frontal brain tumor " especially that the patient is refusing to any radiation treatment at this time.       This new recommended  neurosurgical intervention was  discussed today again with patient and his family (wife and children) in detail (in front ICU nurse, neurosurgery PA and the ICU N, Amanda) , and the alternative treatment options, as well as the associated benefits and risks.   All questions and concerns were answered and addressed.             Patient and his family elected to proceed with the recommended surgical treatment option and patient signed the surgical consent.           PROCEDURE:      Patient was brought to the OR where he was placed initially supine on the OR table.  Then, IV lines, A-line  and Foley catheter were inserted and maintained.    Next, the neurophysiology team connected their needles and electrodes according to their protocol.  Next, patient's head was secured and fixed in [place] three-point head fixation system.  The patient's body was secured in the position by utilizing belts and tapes.   Next, we did registration between patient and the neuronavigation system and   as we were satisfied with the accuracy of neuronavigation system, we shaved the patient's head (left frontal area).  Next,  We  localized  the location of the tumor based of neuro navigation feedback.  The location of the tumor was in posterior left  frontal area . So we centered the up craniotomy on the left frontal area. Next, we proceeded in draping and prepping the patient in standard fashion. Then, we proceeded with marking the skin incision. Next, we draped and prepped the patient in standard fashion. After that I started with making the skin incision which was a  horseshoe skin incision in the posterior left frontal area . I dissected the soft tissue under the skin and the muscle all the way down to reach the bone.  I used the neuronavigation  to localize The location of the tumor again. Then, I proceeded with making the  bone flap using the high speed drill.  Then, we elevated the bone flap. Then, I localized again the tumor by the neuro-naviagtion.  Then I exposed the dura and did some coagulation of the dura.  After that, I made another localization for the tumor by utilizing the neuronavigation system. I applied several dural raising sutures.  Next, I proceeded with opening the dura in the cruciate fashion. I noted local significant swelling of the brain sulcus that that was almost  In the center to our surgical field. Then, I brought the microscope in, and under the microscope vision, Utilizing intraoperative brain mapping (cortical and subcortical stimulations) as well as with  the assistance of neuronavigation system, I able to localize the borders of the tumor after resected small amount of the cortic in the anterior aspect of the tumor .  After that, I sent several biopsies for frozen exam.     Then, I used a combination of microsurgical techniques, Sonopet and bipolar, as well as by changing the microscope light filters,  to debulk he tumor in piecemeal fashion.   At that time, the results of the frozen came back as more consistent with metastatic cancer. After I reached the point that I thought that I achieved what appeared to be a total resection for the tumor,  I proceeded with performing a meticulous hemostasis.  As I was satisfied with the hemostasis, I applied  FloSeal, fibrillar and Surgicel and then I did a copious irrigation.  Then, as the irrigated  Fluid kept returning back as a clear fluid and no obvious bleed, I proceeded  with closing the dura in a water-tight fashion using a piece of DuraGen and applied a layer of TISSEEL on the top of the dura.  Next, we returned back the bone flap and secured the bone flap with screws and plates.    Next, We did a meticulous irrigation again.  After that we closed the wound in a standard  fashion.    The patient tolerated the procedure very well without  intraoperative complications.  At the end of the surgery, patient was extubated and was transferred to the PACU for further observation and  treatment.     Brayton El PA-C ( Neurosurgery PA) assisted throughout the procedure with positioning, draping, retraction, wound closure, and dressing application.        Roetta Sessions, MD, MD  Electronically signed by me on 07/31/2021 at 1:28 PM

## 2021-07-31 NOTE — Progress Notes (Signed)
1328 Non reactive on arrival to PACU with  spontaneous resp ,simple mask and oral airway,  1332 Awake and looking around , oral airway removed , alert and oriented , denies any pain or nausea and drifts back to sleep easily , pt not understanding the pedal push and pull but pt moved both legs  1345 resting resp   1400 pt to Ct Scan with R.N at bedside   1425 pt care turned over to ICU R.N  ,

## 2021-07-31 NOTE — Progress Notes (Signed)
Patient was seen and examined in the pre op area in conjunction with neurosurgery PA Brayton El PA-C).    There are no changes in patient symptoms and neurological exam since yesterday day.  Today I discussed with patient and his family (wife and mother) again today planned/recommended surgical intervention as well as the associated risks and benefits and alternative.  All questions and concerns were addressed and answered.  Patient elected again to go with today planned surgical intervention.
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
## 2021-07-31 NOTE — Anesthesia Procedure Notes (Signed)
Airway  Date/Time: 07/31/2021 7:46 AM  Urgency: elective    Airway not difficult    General Information and Staff    Patient location during procedure: OR  Resident/CRNA: Laretta Alstrom, APRN - CRNA  Performed: resident/CRNA     Indications and Patient Condition  Indications for airway management: anesthesia  Spontaneous ventilation: present  Sedation level: deep  Preoxygenated: yes  Patient position: sniffing  MILS not maintained throughout  Mask difficulty assessment: unable to bag mask vent +/- NMBA (Pt has full beard seal unobtainable)    Final Airway Details  Final airway type: endotracheal airway      Successful airway: ETT  Cuffed: yes   Successful intubation technique: direct laryngoscopy  Facilitating devices/methods: intubating stylet  Endotracheal tube insertion site: oral  Blade: Miller  Blade size: #2  ETT size (mm): 7.5  Cormack-Lehane Classification: grade I - full view of glottis  Placement verified by: chest auscultation and capnometry   Inital cuff pressure (cm H2O): 6  Measured from: lips  ETT to lips (cm): 24  Number of attempts at approach: 1  Ventilation between attempts: bag mask  Number of other approaches attempted: 0    Additional Comments  Pt to OR: monitors applied: Anesthesia Time out preformed: site, surgeon, and operation confirmed: patients questions and concerns answered     Non-anticipated difficult airway: yes

## 2021-07-31 NOTE — Anesthesia Post-Procedure Evaluation (Signed)
Department of Anesthesiology  Postprocedure Note    Patient: Marcus Burnett  MRN: [id number]  Birthdate: 1960-09-19  Date of evaluation: 07/31/2021      Procedure Summary     Date: 07/31/21 Room / Location: [facility] OR 01 / [place]    Anesthesia Start: 0730 Anesthesia Stop: 7169    Procedure: Left Craniotomy Resection of Tumor (Left: Head) Diagnosis:       Brain tumor ([place])      (Brain tumor ([place]) [D49.6])    Surgeons: Illene Labrador, MD Responsible Provider: Gildardo Pounds Heitmeyer, DO    Anesthesia Type: General ASA Status: 3          Anesthesia Type: General    Aldrete Phase I: Aldrete Score: 5    Aldrete Phase II:        Anesthesia Post Evaluation    Patient location during evaluation: PACU  Patient participation: complete - patient participated  Level of consciousness: awake and alert  Airway patency: patent  Nausea & Vomiting: no nausea  Complications: no  Cardiovascular status: blood pressure returned to baseline and hemodynamically stable  Respiratory status: acceptable and spontaneous ventilation  Hydration status: euvolemic

## 2021-07-31 NOTE — Anesthesia Pre-Procedure Evaluation (Signed)
Department of Anesthesiology  Preprocedure Note       Name:  Marcus Burnett   Age:  61 y.o.  DOB:  31-Jul-1960                                          MRN:  [id number]         Date:  07/31/2021      Surgeon: Juliann Mule):  Asem Jonne Ply, MD    Procedure: Procedure(s):  Left Craniotomy Resection of Tumor    Medications prior to admission:   Prior to Admission medications    Medication Sig Start Date End Date Taking? Authorizing Provider   TRULICITY 1.5 CZ/6.6AY SOPN INJECT 0.5 ML (1.5MG  DOSE) INTO THE SKIN EVERY 7 DAYS 11/01/20   Historical Provider, MD   metFORMIN (GLUCOPHAGE) 500 MG tablet TAKE 2 TABLETS (1,000 MG TOTAL) BY MOUTH 2 TIMES DAILY WITH MEALS. 11/06/20   Historical Provider, MD   glipiZIDE (GLUCOTROL XL) 10 MG extended release tablet TAKE 1 TABLET BY MOUTH EVERY DAY 10/10/20   Historical Provider, MD   pioglitazone (ACTOS) 15 MG tablet TAKE 1 TABLET (15 MG TOTAL) BY MOUTH DAILY. 10/09/20   Historical Provider, MD   lisinopril (PRINIVIL;ZESTRIL) 40 MG tablet TAKE 1 TABLET BY MOUTH EVERY DAY 11/19/20   Historical Provider, MD   pravastatin (PRAVACHOL) 80 MG tablet TAKE 1 TABLET BY MOUTH EVERY DAY 10/29/20   Historical Provider, MD   aspirin 81 MG EC tablet Take 81 mg by mouth in the morning.    Historical Provider, MD       Current medications:    Current Facility-Administered Medications   Medication Dose Route Frequency Provider Last Rate Last Admin   ??? levETIRAcetam (KEPPRA) 1,000 mg in sodium chloride 0.9 % 100 mL IVPB  1,000 mg IntraVENous Q12H Brayton El, PA-C       ??? amLODIPine (NORVASC) tablet 10 mg  10 mg Oral Daily Christin Charlotta Newton, APRN - CNP   10 mg at 07/30/21 0759   ??? insulin glargine (LANTUS) injection vial 10 Units  10 Units SubCUTAneous Nightly Christin Charlotta Newton, APRN - CNP   10 Units at 07/30/21 2106   ??? levETIRAcetam (KEPPRA) tablet 500 mg  500 mg Oral BID Brayton El, PA-C   500 mg at 07/30/21 2108   ??? ceFAZolin (ANCEF) 2000 mg in 0.9% sodium chloride 50 mL IVPB   2,000 mg IntraVENous Q8H Brayton El, PA-C   Stopped at 07/31/21 0701   ??? sodium chloride 3 % solution  75 mL/hr IntraVENous Continuous Christin Charlotta Newton, APRN - CNP 75 mL/hr at 07/31/21 3016 Rate Verify at 07/31/21 0109   ??? sodium chloride flush 0.9 % injection 5-40 mL  5-40 mL IntraVENous PRN Brayton El, PA-C       ??? 0.9 % sodium chloride infusion   IntraVENous PRN Brayton El, PA-C   Stopped at 07/28/21 1018   ??? morphine (PF) injection 2 mg  2 mg IntraVENous Q2H PRN Brayton El, PA-C        Or   ??? morphine injection 4 mg  4 mg IntraVENous Q2H PRN Brayton El, PA-C       ??? HYDROcodone-acetaminophen (NORCO) 5-325 MG per tablet 1 tablet  1 tablet Oral Q4H PRN Brayton El, PA-C       ??? insulin lispro (HUMALOG) injection vial 0-8 Units  0-8 Units SubCUTAneous TID WC Iona Beard  Viviana Simpler, PA-C   2 Units at 07/30/21 1719   ??? insulin lispro (HUMALOG) injection vial 0-4 Units  0-4 Units SubCUTAneous Nightly Brayton El, PA-C       ??? glucose chewable tablet 16 g  4 tablet Oral PRN Brayton El, PA-C       ??? dextrose bolus 10% 125 mL  125 mL IntraVENous PRN Brayton El, PA-C        Or   ??? dextrose bolus 10% 250 mL  250 mL IntraVENous PRN Brayton El, PA-C       ??? glucagon (rDNA) injection 1 mg  1 mg SubCUTAneous PRN Brayton El, PA-C       ??? dextrose 10 % infusion   IntraVENous Continuous PRN Brayton El, PA-C       ??? sodium chloride flush 0.9 % injection 5-40 mL  5-40 mL IntraVENous 2 times per day Brayton El, PA-C   10 mL at 07/30/21 2108   ??? sodium chloride flush 0.9 % injection 5-40 mL  5-40 mL IntraVENous PRN Brayton El, PA-C       ??? ondansetron (ZOFRAN-ODT) disintegrating tablet 4 mg  4 mg Oral Q8H PRN Brayton El, PA-C        Or   ??? ondansetron [facility]) injection 4 mg  4 mg IntraVENous Q6H PRN Brayton El, PA-C       ??? polyethylene glycol (GLYCOLAX) packet 17 g  17 g Oral Daily PRN Brayton El, PA-C   17 g at 07/29/21 1249   ??? lisinopril (PRINIVIL;ZESTRIL) tablet 40 mg  40 mg Oral Daily Brayton El, PA-C   40 mg at 07/30/21 0759   ??? pravastatin (PRAVACHOL) tablet 80 mg  80 mg Oral Daily Brayton El, PA-C   80 mg at 07/30/21 9563       Allergies:  No Known Allergies    Problem List:    Patient Active Problem List   Diagnosis Code   ??? 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 Altered mental status R41.82       Past Medical History:  History reviewed. No pertinent past medical history.    Past Surgical History:        Procedure Laterality Date   ??? CRANIOTOMY Right 07/26/2021    RIGHT SIDED CRANIOTOMY FOR 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 performed by Illene Labrador, MD at [place] History:    Social History     Tobacco Use   ??? Smoking status: Never   ??? Smokeless tobacco: Never   Substance Use Topics   ??? Alcohol use: Never                                Counseling given: Not Answered      Vital Signs (Current):   Vitals:    07/31/21 0415 07/31/21 0430 07/31/21 0500 07/31/21 0600   BP:  (!) 150/94 (!) 140/98 (!) 152/84   Pulse:  64 67 56   Resp:  12 18    Temp:  98.6 ??F (37 ??C)     TempSrc:  Oral     SpO2:  98% 93% 94%   Weight: 258 lb 9.6 oz (117.3 kg)      Height:  BP Readings from Last 3 Encounters:   07/31/21 (!) 152/84   01/15/21 122/72       NPO Status:                                                                                 BMI:   Wt Readings from Last 3 Encounters:   07/31/21 258 lb 9.6 oz (117.3 kg)   07/27/21 285 lb 15 oz (129.7 kg)   07/24/21 257 lb (116.6 kg)     Body mass index is 37.11 kg/m??.    CBC:   Lab Results   Component Value Date/Time    WBC 9.6 07/31/2021 04:46 AM    RBC 4.16 07/31/2021 04:46 AM    HGB 12.1 07/31/2021 04:46 AM    HCT 36.1 07/31/2021 04:46 AM    MCV 86.8 07/31/2021 04:46 AM    RDW 15.0 03/20/2021 08:21 AM    PLT 294  07/31/2021 04:46 AM       CMP:   Lab Results   Component Value Date/Time    NA 142 07/31/2021 04:46 AM    K 4.2 07/31/2021 04:46 AM    K 4.2 07/28/2021 06:10 AM    CL 108 07/31/2021 04:46 AM    CO2 25 07/31/2021 04:46 AM    BUN 22 07/31/2021 04:46 AM    CREATININE 1.0 07/31/2021 04:46 AM    AGRATIO 1.3 03/20/2021 08:21 AM    LABGLOM >60 07/31/2021 04:46 AM    GLUCOSE 168 07/31/2021 04:46 AM    GLUCOSE 123 03/20/2021 08:21 AM    PROT 6.4 07/24/2021 04:12 AM    CALCIUM 8.9 07/31/2021 04:46 AM    BILITOT 0.3 07/24/2021 04:12 AM    ALKPHOS 93 07/24/2021 04:12 AM    AST 15 07/24/2021 04:12 AM    ALT 16 07/24/2021 04:12 AM       POC Tests:   Recent Labs     07/30/21  2051   POCGLU 181*       Coags: No results found for: PROTIME, INR, APTT    HCG (If Applicable): No results found for: PREGTESTUR, PREGSERUM, HCG, HCGQUANT     ABGs: No results found for: PHART, PO2ART, PCO2ART, HCO3ART, BEART, O2SATART     Type & Screen (If Applicable):  Lab Results   Component Value Date    LABRH POS 07/26/2021       Drug/Infectious Status (If Applicable):  No results found for: HIV, HEPCAB    COVID-19 Screening (If Applicable): No results found for: COVID19        Anesthesia Evaluation  Patient summary reviewed and Nursing notes reviewed no history of anesthetic complications:   Airway: Mallampati: II  TM distance: >3 FB   Neck ROM: full  Mouth opening: > = 3 FB   Dental:          Pulmonary:normal exam  breath sounds clear to auscultation                            ROS comment: Lung nodules   Cardiovascular:Negative CV ROS  Exercise tolerance: good (>4 METS),  Neuro/Psych:                ROS comment: 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          Pt had no PAT visit       Abdominal:             Vascular: negative vascular ROS.         Other Findings:           Anesthesia Plan      general     ASA 3       Induction: intravenous.  arterial  line  MIPS: Postoperative opioids intended and Prophylactic antiemetics administered.  Anesthetic plan and risks discussed with patient and spouse.      Plan discussed with CRNA.                    [place], DO   07/31/2021

## 2021-08-01 ENCOUNTER — Inpatient Hospital Stay: Admit: 2021-08-01 | Payer: BLUE CROSS/BLUE SHIELD | Primary: Family Medicine

## 2021-08-01 LAB — CBC WITH AUTO DIFFERENTIAL
Basophils Absolute: 0 10*3/uL (ref 0.0–0.1)
Basophils: 0.1 %
Eosinophils Absolute: 0.5 10*3/uL — ABNORMAL HIGH (ref 0.0–0.4)
Eosinophils: 4.5 %
Hematocrit: 33.1 % — ABNORMAL LOW (ref 42.0–52.0)
Hemoglobin: 11.4 gm/dl — ABNORMAL LOW (ref 14.0–18.0)
Immature Grans (Abs): 0.06 10*3/uL (ref 0.00–0.07)
Immature Granulocytes: 0.6 %
Lymphocytes Absolute: 1.5 10*3/uL (ref 1.0–4.8)
Lymphocytes: 14.6 %
MCH: 29.2 pg (ref 26.0–33.0)
MCHC: 34.4 gm/dl (ref 32.2–35.5)
MCV: 84.7 fL (ref 80.0–94.0)
MPV: 11 fL (ref 9.4–12.4)
Monocytes Absolute: 0.7 10*3/uL (ref 0.4–1.3)
Monocytes: 7 %
Platelet Estimate: ADEQUATE
Platelets: 202 10*3/uL (ref 130–400)
RBC: 3.91 10*6/uL — ABNORMAL LOW (ref 4.70–6.10)
RDW-CV: 14.1 % (ref 11.5–14.5)
RDW-SD: 43.4 fL (ref 35.0–45.0)
Seg Neutrophils: 73.2 %
Segs Absolute: 7.3 10*3/uL (ref 1.8–7.7)
WBC: 10 10*3/uL (ref 4.8–10.8)
nRBC: 0 /100 wbc

## 2021-08-01 LAB — POCT GLUCOSE
POC Glucose: 235 mg/dl — ABNORMAL HIGH (ref 70–108)
POC Glucose: 173 mg/dl — ABNORMAL HIGH (ref 70–108)
POC Glucose: 201 mg/dl — ABNORMAL HIGH (ref 70–108)
POC Glucose: 232 mg/dl — ABNORMAL HIGH (ref 70–108)

## 2021-08-01 LAB — BASIC METABOLIC PANEL
BUN: 18 mg/dL (ref 7–22)
CO2: 24 meq/L (ref 23–33)
Calcium: 8.4 mg/dL — ABNORMAL LOW (ref 8.5–10.5)
Chloride: 109 meq/L (ref 98–111)
Creatinine: 1 mg/dL (ref 0.4–1.2)
Glucose: 154 mg/dL — ABNORMAL HIGH (ref 70–108)
Potassium: 3.9 meq/L (ref 3.5–5.2)
Sodium: 141 meq/L (ref 135–145)

## 2021-08-01 LAB — SODIUM
Sodium: 139 meq/L (ref 135–145)
Sodium: 141 meq/L (ref 135–145)
Sodium: 141 meq/L (ref 135–145)
Sodium: 142 meq/L (ref 135–145)
Sodium: 156 meq/L — ABNORMAL HIGH (ref 135–145)

## 2021-08-01 LAB — SCAN OF BLOOD SMEAR

## 2021-08-01 LAB — GLOMERULAR FILTRATION RATE, ESTIMATED: Est, Glom Filt Rate: >60 mL/min/{1.73_m2} (ref >60)

## 2021-08-01 LAB — ANION GAP: Anion Gap: 8 meq/L (ref 8.0–16.0)

## 2021-08-01 MED ORDER — MORPHINE SULFATE (PF) 2 MG/ML IV SOLN
2 MG/ML | INTRAVENOUS | Status: DC | PRN
Start: 2021-08-01 — End: 2021-07-31

## 2021-08-01 MED ORDER — NORMAL SALINE FLUSH 0.9 % IV SOLN
0.9 % | INTRAVENOUS | Status: DC | PRN
Start: 2021-08-01 — End: 2021-08-01

## 2021-08-01 MED ORDER — SODIUM CHLORIDE 0.9 % IV SOLN
0.9 % | INTRAVENOUS | Status: DC | PRN
Start: 2021-08-01 — End: 2021-08-04

## 2021-08-01 MED ORDER — SODIUM CHLORIDE 0.9 % IV SOLN
0.9 % | INTRAVENOUS | Status: DC
Start: 2021-08-01 — End: 2021-07-31

## 2021-08-01 MED ORDER — NORMAL SALINE FLUSH 0.9 % IV SOLN
0.9 % | Freq: Two times a day (BID) | INTRAVENOUS | Status: DC
Start: 2021-08-01 — End: 2021-08-01
  Administered 2021-08-01 (×2): 10 mL via INTRAVENOUS

## 2021-08-01 MED ORDER — MORPHINE SULFATE 4 MG/ML IJ SOLN
4 MG/ML | INTRAMUSCULAR | Status: DC | PRN
Start: 2021-08-01 — End: 2021-07-31

## 2021-08-01 MED FILL — SODIUM CHLORIDE 3 % IV SOLN: 3 % | INTRAVENOUS | Qty: 500

## 2021-08-01 MED FILL — HUMALOG 100 UNIT/ML IJ SOLN: 100 UNIT/ML | INTRAMUSCULAR | Qty: 2

## 2021-08-01 MED FILL — LEVETIRACETAM 500 MG PO TABS: 500 MG | ORAL | Qty: 1

## 2021-08-01 MED FILL — AMLODIPINE BESYLATE 10 MG PO TABS: 10 MG | ORAL | Qty: 1

## 2021-08-01 MED FILL — PRAVASTATIN SODIUM 80 MG PO TABS: 80 MG | ORAL | Qty: 1

## 2021-08-01 MED FILL — LISINOPRIL 40 MG PO TABS: 40 MG | ORAL | Qty: 1

## 2021-08-01 MED FILL — HYDROCODONE-ACETAMINOPHEN 5-325 MG PO TABS: 5-325 MG | ORAL | Qty: 1

## 2021-08-01 MED FILL — CEFAZOLIN SODIUM 10 G IJ SOLR: 10 g | INTRAMUSCULAR | Qty: 2

## 2021-08-01 MED FILL — LANTUS 100 UNIT/ML SC SOLN: 100 UNIT/ML | SUBCUTANEOUS | Qty: 10

## 2021-08-01 MED FILL — CEFAZOLIN 2000 MG IN NS 50 ML IVPB: Qty: 50

## 2021-08-01 NOTE — Progress Notes (Signed)
1845-Patient sodium lab jumped from 139 @ 1430 to 156 @ 1802. Contacted phlebotomy and asked if she drew the sodium lab from the the arm the 3% was infusing in and she stated yes, it was not paused either. Put in stat order for sodium lab to be redrawn, also paused 3% sodium.
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
## 2021-08-01 NOTE — Plan of Care (Signed)
Problem: Discharge Planning  Goal: Discharge to home or other facility with appropriate resources  08/01/2021 0020 by Chestine Spore, RN  Outcome: Progressing  Flowsheets (Taken 08/01/2021 0020)  Discharge to home or other facility with appropriate resources:   Identify barriers to discharge with patient and caregiver   Identify discharge learning needs (meds, wound care, etc)   Arrange for needed discharge resources and transportation as appropriate     Problem: Safety - Adult  Goal: Free from fall injury  08/01/2021 0020 by Chestine Spore, RN  Outcome: Progressing  Flowsheets (Taken 08/01/2021 0020)  Free From Fall Injury: Instruct family/caregiver on patient safety     Problem: Skin/Tissue Integrity  Goal: Absence of new skin breakdown  Description: 1.  Monitor for areas of redness and/or skin breakdown  2.  Assess vascular access sites hourly  3.  Every 4-6 hours minimum:  Change oxygen saturation probe site  4.  Every 4-6 hours:  If on nasal continuous positive airway pressure, respiratory therapy assess nares and determine need for appliance change or resting period.  08/01/2021 0020 by Chestine Spore, RN  Outcome: Progressing  Note: No signs of skin breakdown.  Skin warm, dry, and intact.  Mucous membranes pink and moist.  Assistance with turns/ambulation provided PRN.  Will continue to monitor.       Problem: ABCDS Injury Assessment  Goal: Absence of physical injury  08/01/2021 0020 by Chestine Spore, RN  Outcome: Progressing  Flowsheets (Taken 08/01/2021 0020)  Absence of Physical Injury: Implement safety measures based on patient assessment     Problem: Anxiety  Goal: Will report anxiety at manageable levels  Description: INTERVENTIONS:  1. Administer medication as ordered  2. Teach and rehearse alternative coping skills  3. Provide emotional support with 1:1 interaction with staff  08/01/2021 0020 by Chestine Spore, RN  Outcome: Progressing  Flowsheets (Taken 08/01/2021 0020)  Will  report anxiety at manageable levels:   Teach and rehearse alternative coping skills   Provide emotional support with 1:1 interaction with staff     Problem: Pain  Goal: Verbalizes/displays adequate comfort level or baseline comfort level  08/01/2021 0020 by Chestine Spore, RN  Outcome: Progressing  Flowsheets (Taken 08/01/2021 0020)  Verbalizes/displays adequate comfort level or baseline comfort level:   Encourage patient to monitor pain and request assistance   Assess pain using appropriate pain scale   Administer analgesics based on type and severity of pain and evaluate response   Implement non-pharmacological measures as appropriate and evaluate response     Problem: Nutrition Deficit:  Goal: Optimize nutritional status  08/01/2021 0020 by Chestine Spore, RN  Outcome: Progressing  Flowsheets (Taken 08/01/2021 0020)  Nutrient intake appropriate for improving, restoring, or maintaining nutritional needs:   Assess nutritional status and recommend course of action   Monitor oral intake, labs, and treatment plans     Problem: Neurosensory - Adult  Goal: Achieves stable or improved neurological status  Outcome: Progressing  Flowsheets (Taken 08/01/2021 0020)  Achieves stable or improved neurological status:   Assess for and report changes in neurological status   Maintain blood pressure and fluid volume within ordered parameters to optimize cerebral perfusion and minimize risk of hemorrhage     Problem: Neurosensory - Adult  Goal: Achieves maximal functionality and self care  Outcome: Progressing  Flowsheets (Taken 08/01/2021 0020)  Achieves maximal functionality and self care: Encourage and assist patient to increase activity and self care with guidance from physical  therapy/occupational therapy     Problem: Metabolic/Fluid and Electrolytes - Adult  Goal: Electrolytes maintained within normal limits  Outcome: Progressing  Flowsheets (Taken 08/01/2021 0020)  Electrolytes maintained within normal limits: Monitor  labs and assess patient for signs and symptoms of electrolyte imbalances

## 2021-08-01 NOTE — Consults (Signed)
Received consult for "brain mets renal cell".  Introduced service, pt states "I refuse any chemotherapy and radiation, I do not want either one".  Pt does not want info.  Will not bill for consult.    Electronically signed by Epifania Gore, APRN - CNP on 08/01/2021 at 12:43 PM
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
## 2021-08-01 NOTE — Care Coordination-Inpatient (Signed)
08/01/21, 7:49 AM EST    DISCHARGE ON [facility] day: 9  Location: 4B-01/001-A Reason for admit: Neoplasm of brain causing mass effect on adjacent structures ([place]) [D49.6, G93.5]  Altered mental status, unspecified altered mental status type [R41.82]   Procedure:   2/6 CT Head: A left frontal lobe mass and a left parietotemporal lobe mass are noted as described above. There is midline shift to the left at the level of the septum pellucidum related to subfalcine herniation. Differential diagnoses would include metastatic disease, glioblastoma multiforme, abscess amongst other possibilities  2/6 CT Chest: Bilateral pulmonary nodules are noted. 1 of the nodules which is located in the left lower lobe appears larger compared to the prior study and measures 5-6 mm. Previously it measured 3 mm. Although this could be   due at least in part to differences in slice selection, neoplastic disease is questioned. Short-term follow-up is advised  2/6 CT Abd/pelvis: A few subcentimeter enhancing lesions are noted within the liver, these are better seen on the CT chest study performed on the same day. Neoplastic disease will need to be excluded. Follow-up nonurgent liver   MRI, without and with contrast, is advised; A subcentimeter sclerotic lesion is noted within the L2 vertebral body   which is new since the prior study. This is also suspicious for neoplastic disease.  2/6 CXR: No acute process  2/7 MRI Brain: Lobulated intra-axial 3 masses and associated blood products consistent with hemorrhage. This is most consistent with metastatic disease; Surrounding vasogenic edema with slight midline shift to the left; Mild severity chronic small vessel ischemic changes.  2/7 MRI Abdomen: No abnormal enhancing hepatic mass is seen; Status post left nephrectomy. No residual or recurrent disease in the surgical bed  2/7 Echo with EF 60%  2/7 PICC right basilic  2/8 MRI Lumbar spine: 8 mm  indeterminate lesion in L2. This could represent a small metastasis. Other etiologies are not excluded; No evidence of tumor within the spinal canal; Moderate severity right and mild left foraminal stenosis at the L5-S1 level due to degenerative changes  2/8 CT Head: Preoperative planning for the hemorrhagic masses in the left frontal lobe, right temporal lobe and right occipital lobe  2/9 RIGHT SIDED CRANIOTOMY FOR RESECTION OF OCCIPITAL TUMOR AND OCCIPITAL TEMPORAL TUMOR  2/9 CT Head: represent metastases from renal carcinoma; Large mass in the left frontal lobe measuring 3.6 x 3.3 cm in size with hemorrhage and surrounding edema; Area of encephalomalacia in the right frontal cortex; Probable ischemic changes in the white matter  2/10 CT Head: Stable  2/11 MRI Brain W WO Contrast 1. Status post resection of the right-sided metastases. There is a small amount of acute infarction along the resection cavity in the posterior right temporal lobe.      2. Stable left frontal lobe mass.     2/14 Left Craniotomy Resection of Tumor     Barriers to Discharge: Hgb 11.4, PT/OT/ST, wound drain management, IV Ancef, diabetes management, pain and nausea control, Keppra, Pravachol.   PCP: [name], MD  Readmission Risk Score: 11.8%  Patient Goals/Plan/Treatment Preferences: From home with spouse. Will follow therapy recommendations, clinical course for potential needs.

## 2021-08-01 NOTE — Progress Notes (Addendum)
Attending Note:     Patient was seen and examined by me in ICU in conjunction with neurosurgery PA/ANP.  Discussed with patient, his nurse and the ICU team as well.  All data and imaging reviewed by myself. I agree with examination assessment and plan as documented below.   Doing well.  There is no new neurological deficit.  PT and OT.  Patient to have consultation.  We will DC the drain tomorrow.  Sutures removal after 14 days of every surgeries(craniotomy).  Tapering the steroids over 1 week.  Follow-up the postop MRI.  Follow-up the recommendation of radiation and oncology teams.  Neurosurgery will follow.    Illene Labrador, MD        Neurosurgery Progress Note    Patient:  Marcus Burnett      Unit/Bed:4B-01/001-A    Date of Birth: 14-Jan-1961    MRN: [id number]     Acct: [id number]     Admit date: 07/23/2021    Chief Complaint   Patient presents with    Depression    Altered Mental Status       Patient Seen, Chart, Physician notes, Labs, Radiology studies reviewed.    Subjective: Patient is seen and evaluated on 4B room 1 along with Dr. Gardenia Phlegm where the patient remains postoperative day 6 from occipital craniectomy for safe maximal resection of intercranial lesions and day 1 from frontal craniectomy and resection of frontal lesion, both without complication.  Pain is well controlled at the time of exam this morning        Past, Family, Social History unchanged from admission.    Diet:  ADULT ORAL NUTRITION SUPPLEMENT; Breakfast, Dinner; Wound Healing Oral Supplement  ADULT DIET; Regular; 4 carb choices (60 gm/meal); Low Fat/Low Chol/High Fiber/2 gm Na    Medications:  Scheduled Meds:   sodium chloride flush  5-40 mL IntraVENous 2 times per day    amLODIPine  10 mg Oral Daily    insulin glargine  10 Units SubCUTAneous Nightly    levETIRAcetam  500 mg Oral BID    ceFAZolin  2,000 mg IntraVENous Q8H    insulin lispro  0-8 Units SubCUTAneous TID WC    insulin lispro  0-4 Units SubCUTAneous Nightly    sodium  chloride flush  5-40 mL IntraVENous 2 times per day    lisinopril  40 mg Oral Daily    pravastatin  80 mg Oral Daily     Continuous Infusions:   sodium chloride      sodium chloride 75 mL/hr (08/01/21 0750)    sodium chloride Stopped (07/28/21 1018)    dextrose       PRN Meds:sodium chloride flush, sodium chloride, sodium chloride flush, sodium chloride, morphine **OR** morphine, HYDROcodone 5 mg - acetaminophen, glucose, dextrose bolus **OR** dextrose bolus, glucagon (rDNA), dextrose, sodium chloride flush, ondansetron **OR** ondansetron, polyethylene glycol    Objective: The patient is observed sitting up in bed with head of the bed elevated greater than 30 degrees and pain well controlled.  Dressings are intact over flat dry incisions and the surgical drain was intact and functioning approximately 80 cc per shift.  He remained stable and intact on exam today and was without significant complaints other than mild incisional site discomfort.  He is verbal with clear appropriate speech and participated in discussions involving his care appropriately with purposeful movement x4 and obeys commands appropriately.    Vitals: BP 122/64    Pulse 55    Temp 98.8 ??F (37.1 ??C) (  Oral)    Resp 14    Ht 5\' 10"  (1.778 m)    Wt 258 lb 9.6 oz (117.3 kg)    SpO2 92%    BMI 37.11 kg/m??     Physical Exam     Physical Exam:  Alert and attentive.  Language appropriate, with no aphasia.  Pupils equal.  Facial strength symmetric.    Review of Systems     Constitutional:  Negative for fever.   HENT:  Negative for congestion.    Eyes:  Positive for visual disturbance (improved).   Respiratory:  Negative for apnea.    Cardiovascular:  Negative for chest pain.   Gastrointestinal:  Negative for abdominal distention.   Genitourinary:  Negative for difficulty urinating.   Musculoskeletal:  Negative for back pain.   Neurological:  Positive for weakness.   Psychiatric/Behavioral: Less confusion on exam this morning    24 hour  intake/output:  Intake/Output Summary (Last 24 hours) at 08/01/2021 0901  Last data filed at 08/01/2021 0751  Gross per 24 hour   Intake 4249.48 ml   Output 2955 ml   Net 1294.48 ml     Last 3 weights:  Wt Readings from Last 3 Encounters:   07/31/21 258 lb 9.6 oz (117.3 kg)   07/27/21 285 lb 15 oz (129.7 kg)   07/24/21 257 lb (116.6 kg)         CBC:   Recent Labs     07/30/21  0808 07/31/21  0446 07/31/21  1140 08/01/21  0415   WBC 8.5 9.6  --  10.0   HGB 12.7* 12.1* 10.1* 11.4*   PLT 260 294  --  202     BMP:    Recent Labs     07/30/21  0808 07/30/21  1134 07/31/21  0446 07/31/21  1135 07/31/21  1607 08/01/21  0048 08/01/21  0415 08/01/21  0818   NA 142   < > 142 142   < > 142 141 141   K 4.5  --  4.2 3.8  --   --  3.9  --    CL 108  --  108  --   --   --  109  --    CO2 26  --  25  --   --   --  24  --    BUN 15  --  22  --   --   --  18  --    CREATININE 1.0  --  1.0  --   --   --  1.0  --    GLUCOSE 165*  --  168*  --   --   --  154*  --     < > = values in this interval not displayed.     Calcium:  Recent Labs     08/01/21  0415   CALCIUM 8.4*     Magnesium:No results for input(s): MG in the last 72 hours.  Glucose:  Recent Labs     07/31/21  1619 07/31/21  2056 08/01/21  0754   POCGLU 120* 235* 173*     HgbA1C: No results for input(s): LABA1C in the last 72 hours.  Lipids: No results for input(s): CHOL, TRIG, HDL, LDL, LDLCALC in the last 72 hours.    Radiology reports as per the Radiologist  Radiology: CT HEAD WO CONTRAST    Result Date: 07/23/2021  PROCEDURE: CT HEAD WO CONTRAST CLINICAL INFORMATION:AMS COMPARISON: None TECHNIQUE: 5  mm axial imaging through the head without IV contrast. All CT scans at this facility use dose modulation, iterative reconstruction, and/or weight based dosing when appropriate to reduce the radiation dose to as low as reasonably achievable. FINDINGS: A 4.7 x 2.1 cm mass in the left frontal lobe with vasogenic edema. There is a 5.4 x 3.1 cm mass in the right parietotemporal lobe with  surrounding basal genic edema. There is mass effect. There is midline shift to the left 6 mm at the level of the septum  pellucidum. A hypodense region in the inferior right frontal lobe is noted that likely relates to underlying edematous change. Mild cerebral volume loss. No ventriculomegaly.  No acute intracranial hemorrhage. No intracranial collection. The posterior fossa is unremarkable. The craniocervical junction is unremarkable. No acute bony abnormality. Mild mucosal thickening of the maxillary sinuses, the ethmoid sinuses. Polyp or mucous retention cyst is seen in the right frontal sinus. Otherwise, the remaining paranasal sinuses are clear. The  mastoid air cells are clear. The orbits are unremarkable.     1. A left frontal lobe mass and a left parietotemporal lobe mass are noted as described above. There is midline shift to the left at the level of the septum pellucidum related to subfalcine herniation. Differential diagnoses would include metastatic disease, glioblastoma multiforme, abscess amongst other possibilities. The critical  finding(s) was called to Francisca December PA-C at 1807 hours on 07/23/2021 by Dr. Bobbye Charleston. Verbal acknowledgment and readback was given. **This report has been created using voice recognition software.  It may contain minor errors which are inherent in voice recognition technology.** Final report electronically signed by Dr Varney Biles on 07/23/2021 6:07 PM    CT CHEST W CONTRAST    Result Date: 07/23/2021  CT CHEST WITH CONTRAST COMPARISON: 03/21/2021. FINDINGS: There are no acute infiltrates, consolidations, or pleural effusions. A nodule in the right lower lobe measures 5-6 mm on axial image 32. This does not appear to be significantly changed. Tiny 2 mm nodule in the right middle lobe on axial image 37 does not appear to be significantly changed. 5-6 mm nodule in the right lower lobe on axial image 47 does not appear to be significantly changed. Multiple small nodules are  noted within the left lower lobe. 1 of the nodules, seen on axial image 37, appears larger compared to the prior study and measures 5-6 mm on axial image 37. Previously it measured approximately 3 mm. There is no adenopathy. Cardiac size is upper limits of normal. Thoracic aorta is partially obscured by motion/streak artifact but is normal in caliber without evidence of an aneurysm. Ovoid 9 mm nodule is suspected in the right thyroid lobe on axial image 8, this is partially obscured by streak artifact but does not appear to be significantly changed. The bone windows demonstrate no acute abnormalities. Old healed fracture is noted involving the right seventh rib. The CT abdomen/pelvis study will be reported separately.     1. Bilateral pulmonary nodules are noted. 1 of the nodules which is located in the left lower lobe appears larger compared to the prior study and measures 5-6 mm. Previously it measured 3 mm. Although this could be due at least in part to differences in slice selection, neoplastic disease is questioned. Short-term follow-up is advised. 2. Additional findings are detailed above. This document has been electronically signed by: Isaias Sakai, M.D. on 07/23/2021 10:53 PM All CTs at this facility use dose modulation techniques and iterative reconstructions, and/or weight-based dosing when appropriate  to reduce radiation to a low as reasonably achievable.    MRI LUMBAR SPINE W WO CONTRAST    Result Date: 07/25/2021  PROCEDURE: MRI LUMBAR SPINE W WO CONTRAST CLINICAL INFORMATION: evaluate L2 vertebral body neoplastic disease, history of RCC, new intracranial tumors. COMPARISON: CT abdomen and pelvis 07/23/2021. TECHNIQUE: Sagittal and axial T1 and T2-weighted images were obtained to the lumbar spine. Postcontrast axial and sagittal T1-weighted images were also obtained. FINDINGS: The lumbar vertebral bodies are normally aligned.  There is a 8 mm small indeterminate lesion in L2. This corresponds to the  finding on CT. There does appear to be some central enhancement. This could represent a small metastasis but is indeterminate. There are no other bone lesions. There is some edema associated with facet degenerative changes at the L4-5 level. There is some associated enhancement which appears degenerative in nature.   There are no compression fractures.  No pars defects are noted.  There is mild disc desiccation at the L3-4 and L5-S1 levels.  The visualized aspects of the distal spinal cord are normal. The nerve roots of the cauda equina and the tip of the conus are normal. There are no gross abnormalities in the distal thoracic spine. On the axial images, at T12-L1 through L2-3, there are no degenerative changes. There is no spinal canal or foraminal stenosis. At L3-4, there are mild facet degenerative changes. There is no spinal canal stenosis. There is no foraminal stenosis. At L4-5, there are mild facet degenerative changes. There is no focal disc abnormality. There is no spinal canal stenosis. There is no foraminal stenosis. At L5-S1, there are mild facet degenerative changes. There is a diffuse disc bulge. There is mild narrowing of the left subarticular recess. There is no spinal canal stenosis. There is moderate severity right and mild left foraminal stenosis. There is no abnormal enhancement within the spinal canal. There are no suspicious findings in the visualized aspects of the retroperitoneum and paraspinal soft tissues.      1. 8 mm indeterminate lesion in L2. This could represent a small metastasis. Other etiologies are not excluded. 2. No evidence of tumor within the spinal canal. 3. Moderate severity right and mild left foraminal stenosis at the L5-S1 level due to degenerative changes. **This report has been created using voice recognition software. It may contain minor errors which are inherent in voice recognition technology.** Final report electronically signed by Dr. Carmie End on 07/25/2021  6:06 AM    MRI ABDOMEN W WO CONTRAST    Result Date: 07/24/2021  PROCEDURE: MRI ABDOMEN W WO CONTRAST CLINICAL INFORMATION: evaluate liver enhancing lesion, history of RCC, new intracranial tumors COMPARISON: CT abdomen and pelvis 07/23/2021 TECHNIQUE: Multisequence and multiplanar MRI of the abdomen was performed without and with  contrast. T1 and T2 contrast information were emphasized. CONTRAST: 20  mL of ProHance  intravenously. FINDINGS: Motion artifacts degrade images. LOWER THORAX: No significant lower thoracic findings. HEPATOBILIARY: No focal hepatic mass.  Unremarkable hepatic surface morphology. The gallbladder, and biliary tree are unremarkable. PANCREAS AND SPLEEN: The pancreas is markedly atrophic. No peripancreatic abnormality.  The spleen is not enlarged RETROPERITONEUM: The left kidney is surgically absent. No residual mass is seen in the postoperative bed. The left adrenal gland is also surgically absent. The right adrenal gland is unremarkable. No hydronephrosis. Tiny right renal cyst is seen. BOWEL AND PERITONEUM:  No bowel obstruction or acute inflammatory bowel process.  VASCULAR: The abdominal aorta is not aneurysmal. The main portal vein, splenic vein  and superior mesenteric vein are patent. LYMPH NODES: No significantly enlarged lymph nodes are seen. BONES: No acute bony abnormality. OTHER: None.     1. No abnormal enhancing hepatic mass is seen. 2. Status post left nephrectomy. No residual or recurrent disease in the surgical bed. **This report has been created using voice recognition software.  It may contain minor errors which are inherent in voice recognition technology.** Final report electronically signed by Dr Varney Biles on 07/24/2021 9:21 PM    CT ABDOMEN PELVIS W IV CONTRAST Additional Contrast? None    Result Date: 07/23/2021  CT ABDOMEN AND PELVIS WITH CONTRAST COMPARISON: 02/27/2021. FINDINGS: A few enhancing lesions are noted within the liver. These lesions are better seen on the CT  chest study, for example located in the right hepatic lobe, measuring 7-8 mm on axial image 46. Another faint enhancing lesion located more inferiorly in the right hepatic lobe measures 6-7 mm on axial image 58. The gallbladder is unremarkable. The stomach is significantly underdistended which precludes accurate assessment. A small hiatal hernia is noted. The pancreas and spleen are unremarkable. Right adrenal gland is unremarkable. Subcentimeter low-attenuation lesion is again noted within the right kidney which is too small to characterize. A subcentimeter stone is noted in the upper pole of the right kidney, without hydronephrosis. Postoperative changes of a prior left-sided nephrectomy are again noted. Abdominal aorta is normal in caliber, without evidence of an aneurysm or dissection. 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 There is no evidence of a small bowel obstruction or free air. There is no pericolonic inflammation. Urinary bladder is underdistended. Fat-containing inguinal hernias are again noted. The bone windows demonstrate a subcentimeter sclerotic lesion within the L2 vertebral body level on coronal image 44, this is new since the prior study. Chronic mild loss of height of the L5 vertebral body level is again noted.     1. A few subcentimeter enhancing lesions are noted within the liver, these are better seen on the CT chest study performed on the same day. Neoplastic disease will need to be excluded. Follow-up nonurgent liver MRI, without and with contrast, is advised. 2. A subcentimeter sclerotic lesion is noted within the L2 vertebral body which is new since the prior study. This is also suspicious for neoplastic disease. 3. Additional findings are detailed above. This document has been electronically signed by: Isaias Sakai, M.D. on 07/23/2021 11:08 PM All CTs at this facility use dose modulation techniques and iterative  reconstructions, and/or weight-based dosing when appropriate to reduce radiation to a low as reasonably achievable.    XR CHEST PORTABLE    Result Date: 07/23/2021  PROCEDURE: XR CHEST PORTABLE CLINICAL INFORMATION: cough COMPARISON: None TECHNIQUE: AP portable chest radiograph performed. FINDINGS: No focal pulmonary consolidation. Cardiac silhouette is not enlarged. No pleural effusion. No pneumothorax. No acute bony abnormality.     1. No acute cardiopulmonary finding. **This report has been created using voice recognition software.  It may contain minor errors which are inherent in voice recognition technology.** Final report electronically signed by Dr Varney Biles on 07/23/2021 6:26 PM    MRI BRAIN W WO CONTRAST    Result Date: 07/24/2021  PROCEDURE: MRI BRAIN W [place] INFORMATIONEvaluate brain massesaltered mental status. COMPARISON: Head CT 07/23/2021. TECHNIQUE: Multiplanar and multiple spin echo T1 and T2-weighted images were obtained through the brain before and after the administration of intravenous contrast. Postcontrast images were obtained according to the [facility] protocol.  FINDINGS: In the posterior left frontal lobe there is a 4.4 x 4.3 x 3.2 cm heterogeneously enhancing intra-axial mass. This has lobulated margins. There is some central necrosis. There is some associated susceptibility artifact consistent with some associated blood products. In the right temporal occipital location, there is a 4.3 x 3.5 x 4.1 cm intra-axial mass. This has thick heterogeneous enhancement and lobulated margins. There are associated blood products. In the inferior right occipital lobe there is a lobulated intra-axial mass versus 2 adjacent masses which abut one another. This measures 1.7 x 1.6 x 1.1 cm. This has lobulated margins and relatively uniform enhancement. There are associated blood products. On the FLAIR sequence, there is a moderate amount of abnormal signal surrounding each of the masses  consistent with surrounding vasogenic edema. On the right, this involves portions of the occipital lobe, temporal lobe and extends into the right basal ganglia. This also extends into the right parietal lobe. On the left, this extends throughout the left frontal lobe. There is no restricted diffusion to suggest an acute infarct. The brain volume is mildly reduced.. There are no extra-axial collections.  There is mass effect upon the temporal horn and occipital horn the right lateral ventricle. There is some mild mass effect upon the body of the left lateral ventricle. There is slight midline shift to the left. There is no hydrocephalus. On the FLAIR and T2-weighted sequences, there is mild signal hyperintensity scattered in the white matter of the brain. This is most consistent with mild severity chronic small vessel ischemic changes. On the gradient echo T2-weighted images, there is susceptibility artifact associated with each of the intra-axial masses consistent with blood products.  The major intracranial vascular flow voids are present.  The midline craniocervical junction structures are normal.  The brainstem and pituitary gland are normal. The midline frontal sinus is opacified. There is mucosal thickening in the mastoid air cells.      1. Lobulated intra-axial 3 masses and associated blood products consistent with hemorrhage. This is most consistent with metastatic disease. 2. Surrounding vasogenic edema with slight midline shift to the left. 3. Mild severity chronic small vessel ischemic changes. **This report has been created using voice recognition software. It may contain minor errors which are inherent in voice recognition technology.** Final report electronically signed by Dr. Carmie End on 07/24/2021 7:10 AM                   Assessment: Status postoperative day 6 from a subtotal craniotomy and safe maximal resection of occipital lesions and postoperative day 1 from frontal craniotomy and safe maximal  resection of frontal lesion both performed Dr. 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 *        Plan: Seen and evaluated on 4B1 with Dr. Jonne Ply and critical care.  He is resting comfortably with pain very well controlled.  Dressings are intact over flat dry incisions and the surgical drain placed yesterday is intact and functioning approximately 80 cc.  He was verbal on exam this morning with clear appropriate speech and participated in discussions involving his care appropriately.  He demonstrates purposeful movement times all 4 extremities and obeyed commands appropriately.  PT and OT today as tolerated with up to a chair as tolerated.  Neurosurgery to follow.  Electronically signed by Brayton El, PA-C on 08/01/2021 at 9:01 AM    Neurosurgery

## 2021-08-01 NOTE — Progress Notes (Signed)
[facility] 4B  Occupational Therapy  Reassess/Daily Note  Time:    Time In: 1478  Time Out: 1111  Timed Code Treatment Minutes: 23 Minutes  Minutes: 23          Date: 08/01/2021  Patient Name: Marcus Burnett,   Gender: male      Room: 4B-01/001-A  MRN: [id number]  DOB: [birth year]  (61 y.o.)  Referring Practitioner: Honor Loh, APRN-CNP  Diagnosis: neoplasm of brain causing mass effect on adjacent structures.  Additional Pertinent Hx: per chart review; "Marcus Burnett is a 61 y.o. male who presents for evaluation. Apparently for the past 6 weeks patient has had a decline in his mental health and cognitive skills.Patient worked at [employer] and had a very stressful job.  They both attributed development of depression to job stress.  His behavior changed and slowed. His gait is different and he has blank stare on his face at times.  Patient has trouble communicating his thoughts. 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  Wife was told that "they got everything."  Her and her husband were aware that this type of cancer could spread to lung and brain however they were not informed that there was any metastasis. 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 s/p Left Craniotomy Resection of Tumor on 07/31/2021.    Restrictions/Precautions:  Restrictions/Precautions: General Precautions, Fall Risk, Surgical Protocols JP drain in place    SUBJECTIVE: cooperative, little impulsive, up in recliner upon arrival of therapist  **Pt wanting music playing in background throughout session    PAIN: 0/10: no c/o pain during session    Vitals: Blood Pressure: 139/69  Oxygen: 97%  Heart Rate: 80    COGNITION: Slow Processing, Decreased Safety Awareness, and Impulsive, flat affect noted    ADL:   Footwear Management: with set-up.  For donning slipper socks while seated in recliner (did cross BLE up)  Toileting: Special educational needs teacher.  Stood at toilet to urinate .    BALANCE:  Sitting Balance:  Stand By Assistance. EOB  Standing Balance: Furniture conservator/restorer.      BED MOBILITY:  Sit to Supine: Stand By Assistance      TRANSFERS:  Sit to Stand:  Furniture conservator/restorer.    Stand to Sit: Furniture conservator/restorer.      FUNCTIONAL MOBILITY:  Assistive Device: None  Assist Level:  Furniture conservator/restorer.   Distance: To and from bathroom and + 41ft in hallway  Mildly unsteady without LOB     ADDITIONAL ACTIVITIES:  BUE AROM WFL; MMT 4/5 Bilaterally, B grasp WFL    ASSESSMENT:     Activity Tolerance:  Patient tolerance of  treatment: fair.        Discharge Recommendations: Continue to assess pending progress  Equipment Recommendations: Equipment Needed: No  Plan: Times Per Week: 5x  Times Per Day: Once a day  Current Treatment Recommendations: Strengthening, Balance training, Functional mobility training, Endurance training, Safety education & training, Neuromuscular re-education, Patient/Caregiver education & training, Self-Care / ADL, Coordination training    Patient Education  Patient Education:  see above    Goals  Short Term Goals  Time Frame for Short Term Goals: by discharge  Short Term Goal 1: patient will tolerate standing >10 minutes with supervision in prep for ADL tasks.  Short Term Goal 2: patient will functionally ambulate house hold distances with (S)  with 0-1 cues for safety and sequencing.  Short Term Goal 3: patient will complete ADL routine with (S) with 0-1 cues for safety awareness.  Short Term Goal 4: patient will participate in moderate resistive UB Exer to increase UB strength for functional transfers.    Following session, patient left in safe position with all fall risk precautions in place.

## 2021-08-01 NOTE — Progress Notes (Signed)
Assessment:  This was a spiritual care encounter as a part of rounds. Visit was with patient's family, including children, grandchildren, mother, and spouse, Blair Promise, who were in the ICU waiting area.     Interventions:  Chaplain provided empathic listening and facilitated reflection with spouse about the impact of patient's illness on their family. Chaplain provided prayer and provided information regarding chaplain services and availability.    Outcomes:  Sheryl debriefed the events of the last months and shared that she is unsure how patient's health will continue to unfold.  She shared about the tremendous family support that they have right now but that she is also beginning to feel overwhelmed by everything that is going on, including having her mother at another hospital. She expressed gratitude for prayer and ongoing chaplain support.    Plan:  Spiritual care team will remain available to support patient and fmaily, PRN.      [place], [place].[facility]  [address] [place] [address]  [phone]

## 2021-08-02 ENCOUNTER — Inpatient Hospital Stay: Admit: 2021-08-02 | Payer: BLUE CROSS/BLUE SHIELD | Primary: Family Medicine

## 2021-08-02 DIAGNOSIS — C642 Malignant neoplasm of left kidney, except renal pelvis: Secondary | ICD-10-CM

## 2021-08-02 LAB — CBC WITH AUTO DIFFERENTIAL
Basophils Absolute: 0 10*3/uL (ref 0.0–0.1)
Basophils: 0.2 %
Eosinophils Absolute: 0.5 10*3/uL — ABNORMAL HIGH (ref 0.0–0.4)
Eosinophils: 5.6 %
Hematocrit: 33.3 % — ABNORMAL LOW (ref 42.0–52.0)
Hemoglobin: 10.3 gm/dl — ABNORMAL LOW (ref 14.0–18.0)
Immature Grans (Abs): 0.06 10*3/uL (ref 0.00–0.07)
Immature Granulocytes: 0.6 %
Lymphocytes Absolute: 1.6 10*3/uL (ref 1.0–4.8)
Lymphocytes: 17.2 %
MCH: 28.6 pg (ref 26.0–33.0)
MCHC: 30.9 gm/dl — ABNORMAL LOW (ref 32.2–35.5)
MCV: 92.5 fL (ref 80.0–94.0)
MPV: 10.7 fL (ref 9.4–12.4)
Monocytes Absolute: 1.1 10*3/uL (ref 0.4–1.3)
Monocytes: 11.4 %
Platelets: 206 10*3/uL (ref 130–400)
RBC: 3.6 10*6/uL — ABNORMAL LOW (ref 4.70–6.10)
RDW-CV: 13.7 % (ref 11.5–14.5)
RDW-SD: 46.5 fL — ABNORMAL HIGH (ref 35.0–45.0)
Seg Neutrophils: 65 %
Segs Absolute: 6.1 10*3/uL (ref 1.8–7.7)
WBC: 9.4 10*3/uL (ref 4.8–10.8)
nRBC: 0 /100 wbc

## 2021-08-02 LAB — BASIC METABOLIC PANEL
BUN: 14 mg/dL (ref 7–22)
CO2: 23 meq/L (ref 23–33)
Calcium: 8.2 mg/dL — ABNORMAL LOW (ref 8.5–10.5)
Chloride: 109 meq/L (ref 98–111)
Creatinine: 0.9 mg/dL (ref 0.4–1.2)
Glucose: 167 mg/dL — ABNORMAL HIGH (ref 70–108)
Potassium: 3.8 meq/L (ref 3.5–5.2)
Sodium: 141 meq/L (ref 135–145)

## 2021-08-02 LAB — ANION GAP: Anion Gap: 9 meq/L (ref 8.0–16.0)

## 2021-08-02 LAB — SODIUM
Sodium: 137 meq/L (ref 135–145)
Sodium: 139 meq/L (ref 135–145)
Sodium: 142 meq/L (ref 135–145)
Sodium: 143 meq/L (ref 135–145)
Sodium: 139 meq/L (ref 135–145)

## 2021-08-02 LAB — POCT GLUCOSE
POC Glucose: 244 mg/dl — ABNORMAL HIGH (ref 70–108)
POC Glucose: 171 mg/dl — ABNORMAL HIGH (ref 70–108)
POC Glucose: 233 mg/dl — ABNORMAL HIGH (ref 70–108)
POC Glucose: 245 mg/dl — ABNORMAL HIGH (ref 70–108)

## 2021-08-02 LAB — GLOMERULAR FILTRATION RATE, ESTIMATED: Est, Glom Filt Rate: >60 mL/min/{1.73_m2} (ref >60)

## 2021-08-02 MED ORDER — GADOTERIDOL 279.3 MG/ML IV SOLN
279.3 MG/ML | Freq: Once | INTRAVENOUS | Status: AC | PRN
Start: 2021-08-02 — End: 2021-08-02
  Administered 2021-08-02: 21:00:00 20 mL via INTRAVENOUS

## 2021-08-02 MED FILL — LEVETIRACETAM 500 MG PO TABS: 500 MG | ORAL | Qty: 1

## 2021-08-02 MED FILL — HUMALOG 100 UNIT/ML IJ SOLN: 100 UNIT/ML | INTRAMUSCULAR | Qty: 2

## 2021-08-02 MED FILL — SODIUM CHLORIDE 3 % IV SOLN: 3 % | INTRAVENOUS | Qty: 500

## 2021-08-02 MED FILL — PRAVASTATIN SODIUM 80 MG PO TABS: 80 MG | ORAL | Qty: 1

## 2021-08-02 MED FILL — LANTUS 100 UNIT/ML SC SOLN: 100 UNIT/ML | SUBCUTANEOUS | Qty: 10

## 2021-08-02 MED FILL — CEFAZOLIN SODIUM 10 G IJ SOLR: 10 g | INTRAMUSCULAR | Qty: 2

## 2021-08-02 MED FILL — CEFAZOLIN 2000 MG IN NS 50 ML IVPB: Qty: 50

## 2021-08-02 MED FILL — LISINOPRIL 40 MG PO TABS: 40 MG | ORAL | Qty: 1

## 2021-08-02 MED FILL — AMLODIPINE BESYLATE 10 MG PO TABS: 10 MG | ORAL | Qty: 1

## 2021-08-02 MED FILL — HYDROCODONE-ACETAMINOPHEN 5-325 MG PO TABS: 5-325 MG | ORAL | Qty: 1

## 2021-08-02 NOTE — Plan of Care (Signed)
Problem: Safety - Adult  Goal: Free from fall injury  08/02/2021 0928 by Carmell Austria, RN  Outcome: Progressing  Problem: Pain  Goal: Verbalizes/displays adequate comfort level or baseline comfort level  08/02/2021 0928 by Carmell Austria, RN  Outcome: Progressing  Flowsheets (Taken 08/02/2021 0815)  Verbalizes/displays adequate comfort level or baseline comfort level:   Encourage patient to monitor pain and request assistance   Assess pain using appropriate pain scale   Administer analgesics based on type and severity of pain and evaluate response

## 2021-08-02 NOTE — Care Coordination-Inpatient (Signed)
08/02/21, 12:11 PM EST    DISCHARGE ON GOING EVALUATION    [facility] day: 10  Location: 4B-01/001-A Reason for admit: Neoplasm of brain causing mass effect on adjacent structures ([facility]) [D49.6, G93.5]  Altered mental status, unspecified altered mental status type [R41.82]   Procedure:   2/6 CT Head: A left frontal lobe mass and a left parietotemporal lobe mass are noted as described above. There is midline shift to the left at the level of the septum pellucidum related to subfalcine herniation. Differential diagnoses would include metastatic disease, glioblastoma multiforme, abscess amongst other possibilities  2/6 CT Chest: Bilateral pulmonary nodules are noted. 1 of the nodules which is located in the left lower lobe appears larger compared to the prior study and measures 5-6 mm. Previously it measured 3 mm. Although this could be   due at least in part to differences in slice selection, neoplastic disease is questioned. Short-term follow-up is advised  2/6 CT Abd/pelvis: A few subcentimeter enhancing lesions are noted within the liver, these are better seen on the CT chest study performed on the same day. Neoplastic disease will need to be excluded. Follow-up nonurgent liver   MRI, without and with contrast, is advised; A subcentimeter sclerotic lesion is noted within the L2 vertebral body   which is new since the prior study. This is also suspicious for neoplastic disease.  2/6 CXR: No acute process  2/7 MRI Brain: Lobulated intra-axial 3 masses and associated blood products consistent with hemorrhage. This is most consistent with metastatic disease; Surrounding vasogenic edema with slight midline shift to the left; Mild severity chronic small vessel ischemic changes.  2/7 MRI Abdomen: No abnormal enhancing hepatic mass is seen; Status post left nephrectomy. No residual or recurrent disease in the surgical bed  2/7 Echo with EF 60%  2/7 PICC right basilic  2/8 MRI Lumbar spine: 8 mm  indeterminate lesion in L2. This could represent a small metastasis. Other etiologies are not excluded; No evidence of tumor within the spinal canal; Moderate severity right and mild left foraminal stenosis at the L5-S1 level due to degenerative changes  2/8 CT Head: Preoperative planning for the hemorrhagic masses in the left frontal lobe, right temporal lobe and right occipital lobe  2/9 RIGHT SIDED CRANIOTOMY FOR RESECTION OF OCCIPITAL TUMOR AND OCCIPITAL TEMPORAL TUMOR  2/9 CT Head: represent metastases from renal carcinoma; Large mass in the left frontal lobe measuring 3.6 x 3.3 cm in size with hemorrhage and surrounding edema; Area of encephalomalacia in the right frontal cortex; Probable ischemic changes in the white matter  2/10 CT Head: Stable  2/11 MRI Brain W WO Contrast 1. Status post resection of the right-sided metastases. There is a small amount of acute infarction along the resection cavity in the posterior right temporal lobe.      2. Stable left frontal lobe mass.      2/14 Left Craniotomy Resection of Tumor    2/15 CT Head WO Contrast 1. Status post recent left frontal and right parietal craniotomy.   Intraparenchymal blood products within the left frontal lobe, without   significant change since most recent prior study. Trace right temporal   blood products without significant change.   2. Moderate pneumocephalus with interval improvement.   3. Large areas of vasogenic edema within the right cerebral hemisphere   without significant change.      Barriers to Discharge: PT/OT/ST, Neurosurgery following, Pt declines any chemotherapy or radiation, IV  Ancef, diabetes management, Keppra, 3% saline infusing, Pravachol, pain and nausea control.   PCP: [name], MD  Readmission Risk Score: 12.5%  Patient Goals/Plan/Treatment Preferences: From home with spouse. Will follow therapy recommendations, clinical course for potential needs

## 2021-08-02 NOTE — Progress Notes (Signed)
[facility]  OCCUPATIONAL THERAPY MISSED TREATMENT NOTE  [facility] 4B  4B-01/001-A      Date: 08/02/2021  Patient Name: Marcus Burnett        CSN: [id number]   DOB: Apr 04, 1961  (61 y.o.)  Gender: male   Referring Practitioner: Honor Loh, APRN-CNP  Diagnosis: neoplasm of brain causing mass effect on adjacent structures.         REASON FOR MISSED TREATMENT:  Attempt x 1, PT with Pt. Attempt x 2, Pt finishing lunch & visiting with son. Will check back 2/17.

## 2021-08-02 NOTE — Progress Notes (Signed)
Patient was seen and examined in the ICU.    S/p right posterior temporal occipital craniotomy for resection metastatic brain tumors .  S/p Left frontal craniotomy for resection metastatic brain tumor .  No acute event over the night  Doing well  No new neurological deficit.  Awake alert oriented and follows command by 4.  Speech: improved  Postoperative MRI showed gross total resection of patient's three metastatic tumor.      patient recommended treatment and plan from neurosurgical perspective:     Continue the current medical management per ICU team.  PT and OT.  inPatient to have consultation.  We will DC the drain tomorrow.  Sutures removal after 14 days of every surgeries(craniotomy).  Tapering the steroids over 1 week.  Follow-up the recommendation of radiation and oncology teams.  Drained to be removed  tomorrow.  Discuss with pt and his family. Also the case was discussed with ICU team  Follow up with neurosurgery out patients clinic in 2 weeks from his discharge.     Illene Labrador, MD

## 2021-08-02 NOTE — Plan of Care (Signed)
Patient seen and examined.  Agree with current treatment.  MRI with and without contrast today showing expected postoperative changes.  Given concerns for adrenal insufficiency noted during ICU stay, will repeat morning cortisol.  Discussed plan of care with patient.  He has had discussions with both radiation and medical oncology.  At this time, he does not wish to pursue chemotherapy or radiation.  Hospitalist will assume care.    Electronically signed by Otilio Jefferson, PA-C on 08/02/21 at 6:55 PM EST

## 2021-08-02 NOTE — Progress Notes (Signed)
[facility] 4[facility]  DAILY NOTE    TIME   SLP Individual Minutes  Time In: 3557  Time Out: 1032  Minutes: 26  Timed Code Treatment Minutes: 26 Minutes       Date: 08/02/2021  Patient Name: Marcus Burnett      CSN: [id number]   DOB: Nov 02, 1960  (61 y.o.)  Gender: male   Referring Physician:  Illene Labrador, MD  Diagnosis: Neoplasm of brain causing mass effect on adjacent structures   Precautions: Fall risk, seizure precautions   Current Diet: Regular diet with thin liquids   Swallowing Strategies: Standard Universal Swallow Precautions  Date of Last MBS/FEES: Not Applicable    Pain:  0/62; head-upper right quadrant. Patient denies need for pain medication at this time.     Subjective:  RN approved ST session. Patient seen at bedside, alert and cooperative. Patient's wife and mother entered room, engaged within therapy session     Education provided to patient re: low stimulation post surgery to maximize brain healing. ST encouraging use of music with deferment of visual screens at this time to reduce stimulation; patient in agreement and verbalized understanding.     Patient's wife inquiring about need for ongoing speech therapy services upon discharge; ST supporting continued speech therapy services upon discharge via [place] or OP to maximize patient recovery. Patient's wife reporting "Or if he wants to return to work part time or something."     Short-Term Goals:  SHORT TERM GOAL #1:  Goal 1: Patient will complete immediate/delayed recall tasks with 80% accuracy given min cues to improve safe return to IADLs/ADLs  INTERVENTIONS:   Recall x4 item grocery list (coffee, chocolate, chips, pretzels):    Immediate recall: 3/4 indep, 1/4 unable to recall despite max cues, response provided to patient  ST then provided patient with written information to improve recall.   5 minute delay: 4/4 indep  10 minute delay: 4/4 indep     Orientation: 6/7 indep, 1/7 min cues     SHORT TERM GOAL  #2:  Goal 2: Patient will complete executive functioning/visual/verbal reasoning tasks with 80% given min cues to improve return to IADLs/ADLs  INTERVENTIONS:   Reasoning; generating x3 reasons per situation provided  Trial 1: 3/3 indep  Trial 2: 3/3 indep  Trial 3: 3/3 indep  Trial 4: 3/3 indep    SHORT TERM GOAL #3:  Goal 3: Patient will complete divergent naming/thought organization tasks with 80% accuracy given min cues to improve word finding abilities and return to IADLs and vocational activities  INTERVENTIONS: Divergent naming: WNL 11 members/60 sec  Trial 1 11 members/60 sec  Trial 2 11 members/60 sec  Trial 3 9 members/60 sec    SHORT TERM GOAL #4:  Goal 4: Patient will complete complex attention tasks with no more than 3 errors to improve safe return to driving and IADLs.  INTERVENTIONS: Patient completed entire therapy session with music on low volume. Patient very much enjoys music. Patient with self redirection x1 throughout therapy session.       Long-Term Goals: No LTGs due to short ELOS              EDUCATION:  Learner: Patient  Education:  Reviewed results and recommendations of this evaluation, Reviewed ST goals and Plan of Care, Reviewed recommendations for follow-up, and Education Related to Potential Risks and Complications Due to Impairment/Illness/Injury  Evaluation of Education: Verbalizes understanding and Demonstrates with assistance  ASSESSMENT/PLAN:  Activity Tolerance:  Patient tolerance of  treatment: good.      Assessment/Plan: Patient progressing toward established goals.  Continues to require skilled care of licensed speech pathologist to progress toward achievement of established goals and plan of care..     Plan for Next Session: Cognitive tx   Discharge Recommendations: [place] [place]
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
## 2021-08-02 NOTE — Plan of Care (Signed)
Problem: Discharge Planning  Goal: Discharge to home or other facility with appropriate resources  Outcome: Progressing  Flowsheets (Taken 08/01/2021 1934)  Discharge to home or other facility with appropriate resources: Identify barriers to discharge with patient and caregiver     Problem: Safety - Adult  Goal: Free from fall injury  Outcome: Progressing     Problem: Pain  Goal: Verbalizes/displays adequate comfort level or baseline comfort level  Outcome: Progressing     Problem: Neurosensory - Adult  Goal: Achieves stable or improved neurological status  Outcome: Progressing  Flowsheets (Taken 08/01/2021 1934)  Achieves stable or improved neurological status: Assess for and report changes in neurological status  Goal: Achieves maximal functionality and self care  Outcome: Progressing
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
## 2021-08-03 ENCOUNTER — Inpatient Hospital Stay: Discharge: 2021-08-07 | Payer: BLUE CROSS/BLUE SHIELD | Attending: Radiation Oncology | Primary: Family Medicine

## 2021-08-03 LAB — BASIC METABOLIC PANEL
BUN: 11 mg/dL (ref 7–22)
CO2: 25 meq/L (ref 23–33)
Calcium: 8.6 mg/dL (ref 8.5–10.5)
Chloride: 103 meq/L (ref 98–111)
Creatinine: 0.9 mg/dL (ref 0.4–1.2)
Glucose: 157 mg/dL — ABNORMAL HIGH (ref 70–108)
Potassium: 3.7 meq/L (ref 3.5–5.2)
Sodium: 137 meq/L (ref 135–145)

## 2021-08-03 LAB — CBC WITH AUTO DIFFERENTIAL
Basophils Absolute: 0 10*3/uL (ref 0.0–0.1)
Basophils: 0.1 %
Eosinophils Absolute: 0.4 10*3/uL (ref 0.0–0.4)
Eosinophils: 4.6 %
Hematocrit: 32.2 % — ABNORMAL LOW (ref 42.0–52.0)
Hemoglobin: 11 gm/dl — ABNORMAL LOW (ref 14.0–18.0)
Immature Grans (Abs): 0.06 10*3/uL (ref 0.00–0.07)
Immature Granulocytes: 0.7 %
Lymphocytes Absolute: 1.4 10*3/uL (ref 1.0–4.8)
Lymphocytes: 15.5 %
MCH: 29.1 pg (ref 26.0–33.0)
MCHC: 34.2 gm/dl (ref 32.2–35.5)
MCV: 85.2 fL (ref 80.0–94.0)
MPV: 10.5 fL (ref 9.4–12.4)
Monocytes Absolute: 0.8 10*3/uL (ref 0.4–1.3)
Monocytes: 9.5 %
Platelets: 251 10*3/uL (ref 130–400)
RBC: 3.78 10*6/uL — ABNORMAL LOW (ref 4.70–6.10)
RDW-CV: 13.7 % (ref 11.5–14.5)
RDW-SD: 42.5 fL (ref 35.0–45.0)
Seg Neutrophils: 69.6 %
Segs Absolute: 6.1 10*3/uL (ref 1.8–7.7)
WBC: 8.8 10*3/uL (ref 4.8–10.8)
nRBC: 0 /100 wbc

## 2021-08-03 LAB — ANION GAP: Anion Gap: 9 meq/L (ref 8.0–16.0)

## 2021-08-03 LAB — POCT GLUCOSE
POC Glucose: 286 mg/dl — ABNORMAL HIGH (ref 70–108)
POC Glucose: 205 mg/dl — ABNORMAL HIGH (ref 70–108)
POC Glucose: 196 mg/dl — ABNORMAL HIGH (ref 70–108)
POC Glucose: 304 mg/dl — ABNORMAL HIGH (ref 70–108)

## 2021-08-03 LAB — CORTISOL TOTAL: Cortisol: 4.35 ug/dL

## 2021-08-03 LAB — GLOMERULAR FILTRATION RATE, ESTIMATED: Est, Glom Filt Rate: >60 mL/min/{1.73_m2} (ref >60)

## 2021-08-03 MED ORDER — INSULIN GLARGINE 100 UNIT/ML SC SOLN
100 UNIT/ML | Freq: Every evening | SUBCUTANEOUS | Status: DC
Start: 2021-08-03 — End: 2021-08-04
  Administered 2021-08-04: 02:00:00 12 [IU] via SUBCUTANEOUS

## 2021-08-03 MED ORDER — FAMOTIDINE 20 MG PO TABS
20 MG | Freq: Two times a day (BID) | ORAL | Status: DC
Start: 2021-08-03 — End: 2021-08-04
  Administered 2021-08-03 – 2021-08-04 (×3): 20 mg via ORAL

## 2021-08-03 MED ORDER — INSULIN LISPRO 100 UNIT/ML IJ SOLN
100 UNIT/ML | Freq: Three times a day (TID) | INTRAMUSCULAR | Status: DC
Start: 2021-08-03 — End: 2021-08-04
  Administered 2021-08-03 – 2021-08-04 (×3): 4 [IU] via SUBCUTANEOUS

## 2021-08-03 MED FILL — LISINOPRIL 40 MG PO TABS: 40 MG | ORAL | Qty: 1

## 2021-08-03 MED FILL — HUMALOG 100 UNIT/ML IJ SOLN: 100 UNIT/ML | INTRAMUSCULAR | Qty: 4

## 2021-08-03 MED FILL — LEVETIRACETAM 500 MG PO TABS: 500 MG | ORAL | Qty: 1

## 2021-08-03 MED FILL — LANTUS 100 UNIT/ML SC SOLN: 100 UNIT/ML | SUBCUTANEOUS | Qty: 12

## 2021-08-03 MED FILL — AMLODIPINE BESYLATE 10 MG PO TABS: 10 MG | ORAL | Qty: 1

## 2021-08-03 MED FILL — PRAVASTATIN SODIUM 80 MG PO TABS: 80 MG | ORAL | Qty: 1

## 2021-08-03 MED FILL — CEFAZOLIN 2000 MG IN NS 50 ML IVPB: Qty: 50

## 2021-08-03 MED FILL — CEFAZOLIN SODIUM 10 G IJ SOLR: 10 g | INTRAMUSCULAR | Qty: 2

## 2021-08-03 MED FILL — FAMOTIDINE 20 MG PO TABS: 20 MG | ORAL | Qty: 1

## 2021-08-03 MED FILL — HUMALOG 100 UNIT/ML IJ SOLN: 100 UNIT/ML | INTRAMUSCULAR | Qty: 2

## 2021-08-03 NOTE — Other (Signed)
08/03/21 1015   Safe Environment   Safety Measures Other (comment)  (vn rounds complete pt unable to hear me on the camera , pt did not respond to audio , enabled video , pt could not see or hear me , no signs of distress noted , staff presently in room with pt)
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
## 2021-08-03 NOTE — Progress Notes (Signed)
Pt presented alert and oriented x4.  Pt has clear speech with delayed responses.  Pt pupils are round equal and reactive to light.  Pt states pain at a constant 2 with dull radiating pain throughout body. Mucous membranes are pink and moist dentures not currently in. Radial pulse is strong but not bounding at a +3.  Respirations are regular, unlabored, and symmetrical with no abnormalities bilat, with no cough on room air.   Hand grasps are equal and strong bilat. Capillary refill is under 3 seconds in hands.  Pt has a left peripheral IV.  Pt abdomen is round, soft, and nontender.  Bowel sounds are active in all four quadrants.  Pt voiced that last bowel movement was yesterday.  Legs have no present edema and pt has no numbness or tingling.  Pt feet are intact with no open wounds. Pedal push and pull were unsteady and moderate and equal bilat and pedal pulse is strong at a +3 . Skin is dry and intact with no redness on bony prominences.  Skin is warm and dry with a dressing on front left head and back of the head with dressings dry and intact on the front and some dark red discharge in the back. Pt was left with bed alarm on, call light in reach, and door closed to promote rest  Prettig RSCSN

## 2021-08-03 NOTE — Progress Notes (Signed)
Pt is eating in bed listening to music.  Bed is in lowest position with alarm on, call light in reach, and door closed for rest  Prettig RSCSN

## 2021-08-03 NOTE — Progress Notes (Signed)
[place]  OCCUPATIONAL THERAPY MISSED TREATMENT NOTE  [facility] 4A  4A-17/017-A      Date: 08/03/2021  Patient Name: Marcus Burnett        CSN: [id number]   DOB: 01/02/1961  (61 y.o.)  Gender: male   Referring Practitioner: Honor Loh, APRN-CNP  Diagnosis: neoplasm of brain causing mass effect on adjacent structures.         REASON FOR MISSED TREATMENT:  Neurosurgery in patient's room removing surgical drain on OT arrival. Will try back later as time allows

## 2021-08-03 NOTE — Progress Notes (Signed)
Pt was in bed as his wife was visiting with him. He had brain surgery to remove tumor. He was doing well and wanted prayer. He was encouraged and blessed.    08/03/21 1616   Encounter Summary   Service Provided For: Patient and family together   Referral/Consult From: Rounding   Support System Spouse   Last Encounter  08/03/21   Complexity of Encounter Moderate   Begin Time 1530   End Time  1542   Total Time Calculated 12 min   Spiritual/Emotional needs   Type Spiritual Support   Assessment/Intervention/Outcome   Assessment Hopeful   Intervention Empowerment

## 2021-08-03 NOTE — Plan of Care (Signed)
Problem: Discharge Planning  Goal: Discharge to home or other facility with appropriate resources  Outcome: Progressing  Flowsheets (Taken 08/03/2021 1959)  Discharge to home or other facility with appropriate resources:   Identify barriers to discharge with patient and caregiver   Arrange for needed discharge resources and transportation as appropriate   Identify discharge learning needs (meds, wound care, etc)     Problem: Safety - Adult  Goal: Free from fall injury  Outcome: Progressing  Flowsheets (Taken 08/03/2021 2234)  Free From Fall Injury: Instruct family/caregiver on patient safety     Problem: Skin/Tissue Integrity  Goal: Absence of new skin breakdown  Description: 1.  Monitor for areas of redness and/or skin breakdown  2.  Assess vascular access sites hourly  3.  Every 4-6 hours minimum:  Change oxygen saturation probe site  4.  Every 4-6 hours:  If on nasal continuous positive airway pressure, respiratory therapy assess nares and determine need for appliance change or resting period.  Outcome: Progressing  Note: No signs and/or symptoms of infection noted during this shift. Patient afebrile during this shift. No new skin breakdown noted during this shift. Patient able to turn self in bed; staff assistance provided as needed. Foot of bed elevated.     Problem: ABCDS Injury Assessment  Goal: Absence of physical injury  Outcome: Progressing  Flowsheets (Taken 08/03/2021 2234)  Absence of Physical Injury: Implement safety measures based on patient assessment     Problem: Anxiety  Goal: Will report anxiety at manageable levels  Description: INTERVENTIONS:  1. Administer medication as ordered  2. Teach and rehearse alternative coping skills  3. Provide emotional support with 1:1 interaction with staff  Outcome: Progressing  Flowsheets (Taken 08/03/2021 1959)  Will report anxiety at manageable levels: Administer medication as ordered     Problem: Pain  Goal: Verbalizes/displays adequate comfort level or baseline  comfort level  Outcome: Progressing  Flowsheets  Taken 08/03/2021 1959 by Marvis Moeller, RN  Verbalizes/displays adequate comfort level or baseline comfort level:   Encourage patient to monitor pain and request assistance   Assess pain using appropriate pain scale   Administer analgesics based on type and severity of pain and evaluate response   Implement non-pharmacological measures as appropriate and evaluate response  Taken 08/03/2021 1600 by April Turner, RN  Verbalizes/displays adequate comfort level or baseline comfort level:   Encourage patient to monitor pain and request assistance   Assess pain using appropriate pain scale     Problem: Nutrition Deficit:  Goal: Optimize nutritional status  Outcome: Progressing  Flowsheets (Taken 08/03/2021 2234)  Nutrient intake appropriate for improving, restoring, or maintaining nutritional needs:   Assess nutritional status and recommend course of action   Monitor oral intake, labs, and treatment plans     Problem: Neurosensory - Adult  Goal: Achieves stable or improved neurological status  Outcome: Progressing  Flowsheets (Taken 08/03/2021 1959)  Achieves stable or improved neurological status: Assess for and report changes in neurological status     Problem: Neurosensory - Adult  Goal: Achieves maximal functionality and self care  Outcome: Progressing  Flowsheets (Taken 08/03/2021 1959)  Achieves maximal functionality and self care:   Monitor swallowing and airway patency with patient fatigue and changes in neurological status   Encourage and assist patient to increase activity and self care with guidance from physical therapy/occupational therapy     Problem: Metabolic/Fluid and Electrolytes - Adult  Goal: Electrolytes maintained within normal limits  Outcome: Progressing  Flowsheets (Taken 08/03/2021  1959)  Electrolytes maintained within normal limits:   Monitor labs and assess patient for signs and symptoms of electrolyte imbalances   Administer electrolyte replacement  as ordered   Monitor response to electrolyte replacements, including repeat lab results as appropriate    Care plan reviewed with patient. Patient verbalizes understanding of the plan of care and contributes to goal setting.

## 2021-08-03 NOTE — Progress Notes (Addendum)
Neurosurgery Progress Note    Patient:  Marcus Burnett      Unit/Bed:4A-17/017-A    Date of Birth: [birth year]    MRN: [id number]     Acct: [id number]     Admit date: 07/23/2021    Chief Complaint   Patient presents with    Depression    Altered Mental Status       Patient Seen, Chart, Physician notes, Labs, Radiology studies reviewed.    Subjective: Marcus Burnett is seen and evaluated on the floor having transition from the ICU setting without incident.  He is resting comfortably this morning with pain very well controlled.        Past, Family, Social History unchanged from admission.    Diet:  ADULT ORAL NUTRITION SUPPLEMENT; Breakfast, Dinner; Wound Healing Oral Supplement  ADULT DIET; Regular; 4 carb choices (60 gm/meal); Low Fat/Low Chol/High Fiber/2 gm Na    Medications:  Scheduled Meds:   amLODIPine  10 mg Oral Daily    insulin glargine  10 Units SubCUTAneous Nightly    levETIRAcetam  500 mg Oral BID    ceFAZolin  2,000 mg IntraVENous Q8H    insulin lispro  0-8 Units SubCUTAneous TID WC    insulin lispro  0-4 Units SubCUTAneous Nightly    sodium chloride flush  5-40 mL IntraVENous 2 times per day    lisinopril  40 mg Oral Daily    pravastatin  80 mg Oral Daily     Continuous Infusions:   sodium chloride      sodium chloride Stopped (07/28/21 1018)    dextrose       PRN Meds:sodium chloride, sodium chloride, morphine **OR** morphine, HYDROcodone 5 mg - acetaminophen, glucose, dextrose bolus **OR** dextrose bolus, glucagon (rDNA), dextrose, sodium chloride flush, ondansetron **OR** ondansetron, polyethylene glycol    Objective: Patient is observed lying in bed with the head of the bed mildly elevated and pain well controlled.  Dressings are intact over flat dry incision and the surgical drain is intact and functioning with trace output today.  Stable and intact neurologically to his baseline with no additional significant changes noted overnight.    Vitals: BP 133/80    Pulse 64    Temp 97.7 ??F (36.5 ??C) (Oral)     Resp 16    Ht 5\' 10"  (1.778 m)    Wt 264 lb (119.7 kg)    SpO2 92%    BMI 37.88 kg/m??     Physical Exam     Physical Exam:  Alert and attentive.  Language appropriate, with no aphasia.  Pupils equal.  Facial strength symmetric.    Review of Systems     Constitutional:  Negative for fever.   HENT:  Negative for congestion.    Eyes:  Positive for visual disturbance (improved).   Respiratory:  Negative for apnea.    Cardiovascular:  Negative for chest pain.   Gastrointestinal:  Negative for abdominal distention.   Genitourinary:  Negative for difficulty urinating.   Musculoskeletal:  Negative for back pain.   Neurological:  Positive for weakness.   Psychiatric/Behavioral: Less confusion on exam this morning    24 hour intake/output:  Intake/Output Summary (Last 24 hours) at 08/03/2021 0747  Last data filed at 08/03/2021 0729  Gross per 24 hour   Intake 1088.08 ml   Output 5435 ml   Net -4346.92 ml     Last 3 weights:  Wt Readings from Last 3 Encounters:   08/02/21 264 lb (119.7 kg)  08/02/21 263 lb 14.3 oz (119.7 kg)   07/27/21 285 lb 15 oz (129.7 kg)         CBC:   Recent Labs     08/01/21  0415 08/02/21  0451 08/03/21  0406   WBC 10.0 9.4 8.8   HGB 11.4* 10.3* 11.0*   PLT 202 206 251     BMP:    Recent Labs     08/01/21  0415 08/01/21  0818 08/02/21  0451 08/02/21  0811 08/02/21  1210 08/03/21  0406   NA 141   < > 141 143 139 137   K 3.9  --  3.8  --   --  3.7   CL 109  --  109  --   --  103   CO2 24  --  23  --   --  25   BUN 18  --  14  --   --  11   CREATININE 1.0  --  0.9  --   --  0.9   GLUCOSE 154*  --  167*  --   --  157*    < > = values in this interval not displayed.     Calcium:  Recent Labs     08/03/21  0406   CALCIUM 8.6     Magnesium:No results for input(s): MG in the last 72 hours.  Glucose:  Recent Labs     08/02/21  1653 08/02/21  2018 08/03/21  0624   POCGLU 245* 286* 205*     HgbA1C: No results for input(s): LABA1C in the last 72 hours.  Lipids: No results for input(s): CHOL, TRIG, HDL, LDL, LDLCALC  in the last 72 hours.    Radiology reports as per the Radiologist  Radiology: CT HEAD WO CONTRAST    Result Date: 07/23/2021  PROCEDURE: CT HEAD WO CONTRAST CLINICAL INFORMATION:AMS COMPARISON: None TECHNIQUE: 5 mm axial imaging through the head without IV contrast. All CT scans at this facility use dose modulation, iterative reconstruction, and/or weight based dosing when appropriate to reduce the radiation dose to as low as reasonably achievable. FINDINGS: A 4.7 x 2.1 cm mass in the left frontal lobe with vasogenic edema. There is a 5.4 x 3.1 cm mass in the right parietotemporal lobe with surrounding basal genic edema. There is mass effect. There is midline shift to the left 6 mm at the level of the septum  pellucidum. A hypodense region in the inferior right frontal lobe is noted that likely relates to underlying edematous change. Mild cerebral volume loss. No ventriculomegaly.  No acute intracranial hemorrhage. No intracranial collection. The posterior fossa is unremarkable. The craniocervical junction is unremarkable. No acute bony abnormality. Mild mucosal thickening of the maxillary sinuses, the ethmoid sinuses. Polyp or mucous retention cyst is seen in the right frontal sinus. Otherwise, the remaining paranasal sinuses are clear. The  mastoid air cells are clear. The orbits are unremarkable.     1. A left frontal lobe mass and a left parietotemporal lobe mass are noted as described above. There is midline shift to the left at the level of the septum pellucidum related to subfalcine herniation. Differential diagnoses would include metastatic disease, glioblastoma multiforme, abscess amongst other possibilities. The critical  finding(s) was called to Marcus December PA-C at 1807 hours on 07/23/2021 by Dr. Bobbye Charleston. Verbal acknowledgment and readback was given. **This report has been created using voice recognition software.  It may contain minor errors which are inherent in voice  Programmer, applications.** Final  report electronically signed by Dr Varney Biles on 07/23/2021 6:07 PM    CT CHEST W CONTRAST    Result Date: 07/23/2021  CT CHEST WITH CONTRAST COMPARISON: 03/21/2021. FINDINGS: There are no acute infiltrates, consolidations, or pleural effusions. A nodule in the right lower lobe measures 5-6 mm on axial image 32. This does not appear to be significantly changed. Tiny 2 mm nodule in the right middle lobe on axial image 37 does not appear to be significantly changed. 5-6 mm nodule in the right lower lobe on axial image 47 does not appear to be significantly changed. Multiple small nodules are noted within the left lower lobe. 1 of the nodules, seen on axial image 37, appears larger compared to the prior study and measures 5-6 mm on axial image 37. Previously it measured approximately 3 mm. There is no adenopathy. Cardiac size is upper limits of normal. Thoracic aorta is partially obscured by motion/streak artifact but is normal in caliber without evidence of an aneurysm. Ovoid 9 mm nodule is suspected in the right thyroid lobe on axial image 8, this is partially obscured by streak artifact but does not appear to be significantly changed. The bone windows demonstrate no acute abnormalities. Old healed fracture is noted involving the right seventh rib. The CT abdomen/pelvis study will be reported separately.     1. Bilateral pulmonary nodules are noted. 1 of the nodules which is located in the left lower lobe appears larger compared to the prior study and measures 5-6 mm. Previously it measured 3 mm. Although this could be due at least in part to differences in slice selection, neoplastic disease is questioned. Short-term follow-up is advised. 2. Additional findings are detailed above. This document has been electronically signed by: Isaias Sakai, M.D. on 07/23/2021 10:53 PM All CTs at this facility use dose modulation techniques and iterative reconstructions, and/or weight-based dosing when appropriate to reduce  radiation to a low as reasonably achievable.    MRI LUMBAR SPINE W WO CONTRAST    Result Date: 07/25/2021  PROCEDURE: MRI LUMBAR SPINE W WO CONTRAST CLINICAL INFORMATION: evaluate L2 vertebral body neoplastic disease, history of RCC, new intracranial tumors. COMPARISON: CT abdomen and pelvis 07/23/2021. TECHNIQUE: Sagittal and axial T1 and T2-weighted images were obtained to the lumbar spine. Postcontrast axial and sagittal T1-weighted images were also obtained. FINDINGS: The lumbar vertebral bodies are normally aligned.  There is a 8 mm small indeterminate lesion in L2. This corresponds to the finding on CT. There does appear to be some central enhancement. This could represent a small metastasis but is indeterminate. There are no other bone lesions. There is some edema associated with facet degenerative changes at the L4-5 level. There is some associated enhancement which appears degenerative in nature.   There are no compression fractures.  No pars defects are noted.  There is mild disc desiccation at the L3-4 and L5-S1 levels.  The visualized aspects of the distal spinal cord are normal. The nerve roots of the cauda equina and the tip of the conus are normal. There are no gross abnormalities in the distal thoracic spine. On the axial images, at T12-L1 through L2-3, there are no degenerative changes. There is no spinal canal or foraminal stenosis. At L3-4, there are mild facet degenerative changes. There is no spinal canal stenosis. There is no foraminal stenosis. At L4-5, there are mild facet degenerative changes. There is no focal disc abnormality. There is no spinal canal stenosis. There is no foraminal  stenosis. At L5-S1, there are mild facet degenerative changes. There is a diffuse disc bulge. There is mild narrowing of the left subarticular recess. There is no spinal canal stenosis. There is moderate severity right and mild left foraminal stenosis. There is no abnormal enhancement within the spinal canal. There  are no suspicious findings in the visualized aspects of the retroperitoneum and paraspinal soft tissues.      1. 8 mm indeterminate lesion in L2. This could represent a small metastasis. Other etiologies are not excluded. 2. No evidence of tumor within the spinal canal. 3. Moderate severity right and mild left foraminal stenosis at the L5-S1 level due to degenerative changes. **This report has been created using voice recognition software. It may contain minor errors which are inherent in voice recognition technology.** Final report electronically signed by Dr. Carmie End on 07/25/2021 6:06 AM    MRI ABDOMEN W WO CONTRAST    Result Date: 07/24/2021  PROCEDURE: MRI ABDOMEN W WO CONTRAST CLINICAL INFORMATION: evaluate liver enhancing lesion, history of RCC, new intracranial tumors COMPARISON: CT abdomen and pelvis 07/23/2021 TECHNIQUE: Multisequence and multiplanar MRI of the abdomen was performed without and with  contrast. T1 and T2 contrast information were emphasized. CONTRAST: 20  mL of ProHance  intravenously. FINDINGS: Motion artifacts degrade images. LOWER THORAX: No significant lower thoracic findings. HEPATOBILIARY: No focal hepatic mass.  Unremarkable hepatic surface morphology. The gallbladder, and biliary tree are unremarkable. PANCREAS AND SPLEEN: The pancreas is markedly atrophic. No peripancreatic abnormality.  The spleen is not enlarged RETROPERITONEUM: The left kidney is surgically absent. No residual mass is seen in the postoperative bed. The left adrenal gland is also surgically absent. The right adrenal gland is unremarkable. No hydronephrosis. Tiny right renal cyst is seen. BOWEL AND PERITONEUM:  No bowel obstruction or acute inflammatory bowel process.  VASCULAR: The abdominal aorta is not aneurysmal. The main portal vein, splenic vein and superior mesenteric vein are patent. LYMPH NODES: No significantly enlarged lymph nodes are seen. BONES: No acute bony abnormality. OTHER: None.     1. No  abnormal enhancing hepatic mass is seen. 2. Status post left nephrectomy. No residual or recurrent disease in the surgical bed. **This report has been created using voice recognition software.  It may contain minor errors which are inherent in voice recognition technology.** Final report electronically signed by Dr Varney Biles on 07/24/2021 9:21 PM    CT ABDOMEN PELVIS W IV CONTRAST Additional Contrast? None    Result Date: 07/23/2021  CT ABDOMEN AND PELVIS WITH CONTRAST COMPARISON: 02/27/2021. FINDINGS: A few enhancing lesions are noted within the liver. These lesions are better seen on the CT chest study, for example located in the right hepatic lobe, measuring 7-8 mm on axial image 46. Another faint enhancing lesion located more inferiorly in the right hepatic lobe measures 6-7 mm on axial image 58. The gallbladder is unremarkable. The stomach is significantly underdistended which precludes accurate assessment. A small hiatal hernia is noted. The pancreas and spleen are unremarkable. Right adrenal gland is unremarkable. Subcentimeter low-attenuation lesion is again noted within the right kidney which is too small to characterize. A subcentimeter stone is noted in the upper pole of the right kidney, without hydronephrosis. Postoperative changes of a prior left-sided nephrectomy are again noted. Abdominal aorta is normal in caliber, without evidence of an aneurysm or dissection. 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 There is no evidence of a small bowel obstruction or free  air. There is no pericolonic inflammation. Urinary bladder is underdistended. Fat-containing inguinal hernias are again noted. The bone windows demonstrate a subcentimeter sclerotic lesion within the L2 vertebral body level on coronal image 44, this is new since the prior study. Chronic mild loss of height of the L5 vertebral body level is again noted.     1. A few subcentimeter  enhancing lesions are noted within the liver, these are better seen on the CT chest study performed on the same day. Neoplastic disease will need to be excluded. Follow-up nonurgent liver MRI, without and with contrast, is advised. 2. A subcentimeter sclerotic lesion is noted within the L2 vertebral body which is new since the prior study. This is also suspicious for neoplastic disease. 3. Additional findings are detailed above. This document has been electronically signed by: Isaias Sakai, M.D. on 07/23/2021 11:08 PM All CTs at this facility use dose modulation techniques and iterative reconstructions, and/or weight-based dosing when appropriate to reduce radiation to a low as reasonably achievable.    XR CHEST PORTABLE    Result Date: 07/23/2021  PROCEDURE: XR CHEST PORTABLE CLINICAL INFORMATION: cough COMPARISON: None TECHNIQUE: AP portable chest radiograph performed. FINDINGS: No focal pulmonary consolidation. Cardiac silhouette is not enlarged. No pleural effusion. No pneumothorax. No acute bony abnormality.     1. No acute cardiopulmonary finding. **This report has been created using voice recognition software.  It may contain minor errors which are inherent in voice recognition technology.** Final report electronically signed by Dr Varney Biles on 07/23/2021 6:26 PM    MRI BRAIN W WO CONTRAST    Result Date: 07/24/2021  PROCEDURE: MRI BRAIN W [place] INFORMATIONEvaluate brain massesaltered mental status. COMPARISON: Head CT 07/23/2021. TECHNIQUE: Multiplanar and multiple spin echo T1 and T2-weighted images were obtained through the brain before and after the administration of intravenous contrast. Postcontrast images were obtained according to the [facility] protocol. FINDINGS: In the posterior left frontal lobe there is a 4.4 x 4.3 x 3.2 cm heterogeneously enhancing intra-axial mass. This has lobulated margins. There is some central necrosis. There is some associated susceptibility artifact consistent  with some associated blood products. In the right temporal occipital location, there is a 4.3 x 3.5 x 4.1 cm intra-axial mass. This has thick heterogeneous enhancement and lobulated margins. There are associated blood products. In the inferior right occipital lobe there is a lobulated intra-axial mass versus 2 adjacent masses which abut one another. This measures 1.7 x 1.6 x 1.1 cm. This has lobulated margins and relatively uniform enhancement. There are associated blood products. On the FLAIR sequence, there is a moderate amount of abnormal signal surrounding each of the masses consistent with surrounding vasogenic edema. On the right, this involves portions of the occipital lobe, temporal lobe and extends into the right basal ganglia. This also extends into the right parietal lobe. On the left, this extends throughout the left frontal lobe. There is no restricted diffusion to suggest an acute infarct. The brain volume is mildly reduced.. There are no extra-axial collections.  There is mass effect upon the temporal horn and occipital horn the right lateral ventricle. There is some mild mass effect upon the body of the left lateral ventricle. There is slight midline shift to the left. There is no hydrocephalus. On the FLAIR and T2-weighted sequences, there is mild signal hyperintensity scattered in the white matter of the brain. This is most consistent with mild severity chronic small vessel ischemic changes. On the gradient echo T2-weighted images, there  is susceptibility artifact associated with each of the intra-axial masses consistent with blood products.  The major intracranial vascular flow voids are present.  The midline craniocervical junction structures are normal.  The brainstem and pituitary gland are normal. The midline frontal sinus is opacified. There is mucosal thickening in the mastoid air cells.      1. Lobulated intra-axial 3 masses and associated blood products consistent with hemorrhage. This is  most consistent with metastatic disease. 2. Surrounding vasogenic edema with slight midline shift to the left. 3. Mild severity chronic small vessel ischemic changes. **This report has been created using voice recognition software. It may contain minor errors which are inherent in voice recognition technology.** Final report electronically signed by Dr. Carmie End on 07/24/2021 7:10 AM                   Assessment: Postoperative day 8 from a septal craniectomy for safe maximal resection of intracranial lesions postoperative day 3 from frontal craniectomy and resection of frontal lesion, both without complication.    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 *        Plan: Patient is seen and evaluated on the floor having transition from the ICU setting without incident.  He is resting comfortably with pain well controlled.  On exam he is stable and intact neurologically to his baseline with no additional significant changes noted overnight.  Surgery anticipates a dressing change following removal of the surgical drain to be performed at bedside this afternoon.  Neurosurgery further recommends suture removal in 4 days for posterior surgical incision and in 10 days for frontal surgical incisions.  PT and OT with up to a chair as tolerated today.  Neurosurgery to follow    Addendum: The surgical drain  was discontinued and removed at bedside today with new dressings applied to the incision site. [phone])      Electronically signed by Brayton El, PA-C on 08/03/2021 at 7:47 AM    Neurosurgery

## 2021-08-03 NOTE — Care Coordination-Inpatient (Signed)
08/03/21, 1:49 PM EST    Patient goals/plan/ treatment preferences discussed by Case Manager and Education officer, museum.  Patient goals/plan/ treatment preferences reviewed with patient/ family.  Patient/ family verbalize understanding of discharge plan and are in agreement with goal/plan/treatment preferences.  Understanding was demonstrated using the teach back method.  AVS provided by RN at time of discharge, which includes all necessary medical information pertaining to the patients current course of illness, treatment, post-discharge goals of care, and treatment preferences.     Services At/After Discharge: None  Potential discharge this weekend. He denies needs or services.

## 2021-08-03 NOTE — Progress Notes (Signed)
[facility]  INPATIENT SPEECH THERAPY  [facility] 4A   DAILY NOTE    TIME   SLP Individual Minutes  Time In: 0932  Time Out: 1202  Minutes: 10  Timed Code Treatment Minutes: 10 Minutes       Date: 08/03/2021  Patient Name: Marcus Burnett      CSN: [id number]   DOB: December 07, 1960  (61 y.o.)  Gender: male   Referring Physician:  Illene Labrador, MD  Diagnosis: Neoplasm of brain causing mass effect on adjacent structures   Precautions: Fall risk, seizure precautions   Current Diet: Regular diet with thin liquids   Swallowing Strategies: Standard Universal Swallow Precautions  Date of Last MBS/FEES: Not Applicable    Pain:  None reported     Subjective:  RN, April approved session. Patient seen upright in bed. Alert and cooperative.     Short-Term Goals:  SHORT TERM GOAL #1:  Goal 1: Patient will complete immediate/delayed recall tasks with 80% accuracy given min cues to improve safe return to IADLs/ADLs  INTERVENTIONS:   Recall of 5 item grocery list:   Immediate recall: 4/5 indep, 1/5 FO2  Repetition: 4/5 indep, 1/5 extra time   5 minute delay: 5/5 indep     SHORT TERM GOAL #2:  Goal 2: Patient will complete executive functioning/visual/verbal reasoning tasks with 80% given min cues to improve return to IADLs/ADLs  INTERVENTIONS: Calculating bill/coin amounts: 5/6 indep, 1/6 mod cues     SHORT TERM GOAL #3:  Goal 3: Patient will complete divergent naming/thought organization tasks with 80% accuracy given min cues to improve word finding abilities and return to IADLs and vocational activities  INTERVENTIONS: Complex mental math (change making): 5/6 indep, 1/6 min cues     SHORT TERM GOAL #4:  Goal 4: Patient will complete complex attention tasks with no more than 3 errors to improve safe return to driving and IADLs.  INTERVENTIONS: DNT d/t focus on other goals       Long-Term Goals: No LTGs due to short ELOS              EDUCATION:  Learner: Patient  Education:  Reviewed ST goals and Plan of Care  Evaluation  of Education: Verbalizes understanding and Demonstrates with assistance    ASSESSMENT/PLAN:  Activity Tolerance:  Patient tolerance of  treatment: good.      Assessment/Plan: Patient progressing toward established goals.  Continues to require skilled care of licensed speech pathologist to progress toward achievement of established goals and plan of care..     Plan for Next Session: Cognitive tx   Discharge Recommendations: Home Health vs OP      Heywood Footman, M.S. CCC-SLP [phone] 08/03/2021
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
## 2021-08-03 NOTE — Progress Notes (Signed)
Reported off to primary nurse Evalee Mutton RSCSN

## 2021-08-03 NOTE — Progress Notes (Signed)
Pt presents resting in bed.  Pedal push and pull was monitored still as unsteady bilat.  Pt still presents with delayed response verbally and physically. Posterior head dressing is consistent with previous assessment, minimal dark red drainage same as before.  Pt was left resting with bed alarm on, belongings, and call light within reach, door was left closed to promote rest  Prettig RSCSN

## 2021-08-04 LAB — POCT GLUCOSE
POC Glucose: 321 mg/dl — ABNORMAL HIGH (ref 70–108)
POC Glucose: 202 mg/dl — ABNORMAL HIGH (ref 70–108)
POC Glucose: 238 mg/dl — ABNORMAL HIGH (ref 70–108)

## 2021-08-04 MED ORDER — AMLODIPINE BESYLATE 10 MG PO TABS
10 MG | ORAL_TABLET | Freq: Every day | ORAL | 3 refills | Status: DC
Start: 2021-08-04 — End: 2021-08-24

## 2021-08-04 MED ORDER — AMLODIPINE BESYLATE 10 MG PO TABS
10 MG | ORAL_TABLET | Freq: Every day | ORAL | 3 refills | Status: DC
Start: 2021-08-04 — End: 2021-08-04

## 2021-08-04 MED ORDER — LEVETIRACETAM 500 MG PO TABS
500 MG | ORAL_TABLET | Freq: Two times a day (BID) | ORAL | 0 refills | Status: AC
Start: 2021-08-04 — End: 2021-10-12

## 2021-08-04 MED ORDER — FAMOTIDINE 20 MG PO TABS
20 MG | ORAL_TABLET | Freq: Two times a day (BID) | ORAL | 3 refills | Status: DC
Start: 2021-08-04 — End: 2021-08-24

## 2021-08-04 MED ORDER — HYDROCODONE-ACETAMINOPHEN 5-325 MG PO TABS
5-325 MG | ORAL_TABLET | ORAL | 0 refills | Status: AC | PRN
Start: 2021-08-04 — End: 2021-08-07

## 2021-08-04 MED FILL — HUMALOG 100 UNIT/ML IJ SOLN: 100 UNIT/ML | INTRAMUSCULAR | Qty: 4

## 2021-08-04 MED FILL — LEVETIRACETAM 500 MG PO TABS: 500 MG | ORAL | Qty: 1

## 2021-08-04 MED FILL — LANTUS 100 UNIT/ML SC SOLN: 100 UNIT/ML | SUBCUTANEOUS | Qty: 12

## 2021-08-04 MED FILL — PRAVASTATIN SODIUM 80 MG PO TABS: 80 MG | ORAL | Qty: 1

## 2021-08-04 MED FILL — AMLODIPINE BESYLATE 10 MG PO TABS: 10 MG | ORAL | Qty: 1

## 2021-08-04 MED FILL — LISINOPRIL 40 MG PO TABS: 40 MG | ORAL | Qty: 1

## 2021-08-04 MED FILL — FAMOTIDINE 20 MG PO TABS: 20 MG | ORAL | Qty: 1

## 2021-08-04 NOTE — Discharge Summary (Signed)
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   Result Value Ref Range    Sodium 139 135 - 145 meq/L   Sodium    Collection Time: 08/02/21 12:09 AM   Result Value Ref Range    Sodium 142 135 - 145 meq/L   Basic Metabolic Panel    Collection Time: 08/02/21  4:51 AM   Result Value Ref Range    Sodium 141 135 - 145 meq/L    Potassium 3.8 3.5 - 5.2 meq/L    Chloride 109 98 - 111 meq/L    CO2 23 23 - 33 meq/L    Glucose 167 (H) 70 - 108 mg/dL    BUN 14 7 - 22 mg/dL    Creatinine 0.9 0.4 - 1.2 mg/dL    Calcium 8.2 (L) 8.5 - 10.5 mg/dL   CBC with Auto Differential    Collection Time: 08/02/21  4:51 AM   Result Value Ref Range    WBC 9.4 4.8 - 10.8 thou/mm3    RBC 3.60 (L) 4.70 - 6.10 mill/mm3    Hemoglobin 10.3 (L) 14.0 - 18.0 gm/dl    Hematocrit 33.3 (L) 42.0 - 52.0 %    MCV 92.5 80.0 - 94.0 fL    MCH 28.6 26.0 - 33.0 pg    MCHC 30.9 (L) 32.2 - 35.5 gm/dl    RDW-CV 13.7 11.5 - 14.5 %    RDW-SD 46.5 (H) 35.0 - 45.0 fL    Platelets 206 130 - 400 thou/mm3    MPV 10.7 9.4 - 12.4 fL    Seg Neutrophils 65.0 %    Lymphocytes 17.2 %    Monocytes 11.4 %    Eosinophils 5.6 %    Basophils 0.2 %    Immature Granulocytes 0.6 %    Segs Absolute 6.1 1.8 - 7.7 thou/mm3    Lymphocytes Absolute 1.6 1.0 - 4.8 thou/mm3    Monocytes Absolute 1.1 0.4 - 1.3 thou/mm3    Eosinophils Absolute 0.5 (H) 0.0 - 0.4 thou/mm3     Basophils Absolute 0.0 0.0 - 0.1 thou/mm3    Immature Grans (Abs) 0.06 0.00 - 0.07 thou/mm3    nRBC 0 /100 wbc   Anion Gap    Collection Time: 08/02/21  4:51 AM   Result Value Ref Range    Anion Gap 9.0 8.0 - 16.0 meq/L   Glomerular Filtration Rate, Estimated    Collection Time: 08/02/21  4:51 AM   Result Value Ref Range    Est, Glom Filt Rate >60 >60 ml/min/1.58m2   POCT glucose    Collection Time: 08/02/21  8:10 AM   Result Value Ref Range    POC Glucose 171 (H) 70 - 108 mg/dl   Sodium    Collection Time: 08/02/21  8:11 AM   Result Value Ref Range    Sodium 143 135 - 145 meq/L   POCT glucose    Collection Time: 08/02/21 11:13 AM   Result Value Ref Range    POC Glucose 233 (H) 70 - 108 mg/dl   Sodium    Collection Time: 08/02/21 12:10 PM   Result Value Ref Range    Sodium 139 135 - 145 meq/L   POCT glucose    Collection Time: 08/02/21  4:53 PM   Result Value  Ref Range    POC Glucose 245 (H) 70 - 108 mg/dl   POCT glucose    Collection Time: 08/02/21  8:18 PM   Result Value Ref Range    POC Glucose 286 (H) 70 - 108 mg/dl   Basic Metabolic Panel    Collection Time: 08/03/21  4:06 AM   Result Value Ref Range    Sodium 137 135 - 145 meq/L    Potassium 3.7 3.5 - 5.2 meq/L    Chloride 103 98 - 111 meq/L    CO2 25 23 - 33 meq/L    Glucose 157 (H) 70 - 108 mg/dL    BUN 11 7 - 22 mg/dL    Creatinine 0.9 0.4 - 1.2 mg/dL    Calcium 8.6 8.5 - 10.5 mg/dL   CBC with Auto Differential    Collection Time: 08/03/21  4:06 AM   Result Value Ref Range    WBC 8.8 4.8 - 10.8 thou/mm3    RBC 3.78 (L) 4.70 - 6.10 mill/mm3    Hemoglobin 11.0 (L) 14.0 - 18.0 gm/dl    Hematocrit 32.2 (L) 42.0 - 52.0 %    MCV 85.2 80.0 - 94.0 fL    MCH 29.1 26.0 - 33.0 pg    MCHC 34.2 32.2 - 35.5 gm/dl    RDW-CV 13.7 11.5 - 14.5 %    RDW-SD 42.5 35.0 - 45.0 fL    Platelets 251 130 - 400 thou/mm3    MPV 10.5 9.4 - 12.4 fL    Seg Neutrophils 69.6 %    Lymphocytes 15.5 %    Monocytes 9.5 %    Eosinophils 4.6 %    Basophils 0.1 %    Immature Granulocytes 0.7 %     Segs Absolute 6.1 1.8 - 7.7 thou/mm3    Lymphocytes Absolute 1.4 1.0 - 4.8 thou/mm3    Monocytes Absolute 0.8 0.4 - 1.3 thou/mm3    Eosinophils Absolute 0.4 0.0 - 0.4 thou/mm3    Basophils Absolute 0.0 0.0 - 0.1 thou/mm3    Immature Grans (Abs) 0.06 0.00 - 0.07 thou/mm3    nRBC 0 /100 wbc   Anion Gap    Collection Time: 08/03/21  4:06 AM   Result Value Ref Range    Anion Gap 9.0 8.0 - 16.0 meq/L   Glomerular Filtration Rate, Estimated    Collection Time: 08/03/21  4:06 AM   Result Value Ref Range    Est, Glom Filt Rate >60 >60 ml/min/1.63m2   POCT glucose    Collection Time: 08/03/21  6:24 AM   Result Value Ref Range    POC Glucose 205 (H) 70 - 108 mg/dl   Cortisol Total    Collection Time: 08/03/21  6:27 AM   Result Value Ref Range    Cortisol 4.35 ug/dL   POCT glucose    Collection Time: 08/03/21 10:36 AM   Result Value Ref Range    POC Glucose 196 (H) 70 - 108 mg/dl   POCT glucose    Collection Time: 08/03/21  3:18 PM   Result Value Ref Range    POC Glucose 304 (H) 70 - 108 mg/dl   POCT glucose    Collection Time: 08/03/21  7:31 PM   Result Value Ref Range    POC Glucose 321 (H) 70 - 108 mg/dl   POCT glucose    Collection Time: 08/04/21  6:50 AM   Result Value Ref Range    POC Glucose 202 (H) 70 - 108 mg/dl  POCT glucose    Collection Time: 08/04/21 10:49 AM   Result Value Ref Range    POC Glucose 238 (H) 70 - 108 mg/dl       Discharge condition: good  Disposition: Home  Time spent on discharge: 45 minutes    Electronically signed by Kathrynn Ducking, DO on 08/04/2021 at 11:55 AM

## 2021-08-04 NOTE — Progress Notes (Signed)
Discharge instructions reviewed with pt's spouse including f/u appts and medication education. Pt is going to have outpatient therapies, paperwork regarding therapies given to pt's spouse. Both pt and pt's spouse verbalized understanding.

## 2021-08-04 NOTE — Discharge Instructions (Addendum)
Referral for Oncologist sent, see follow up providers for phone number if interested in pursuing the follow up.     Dr. Jonne Ply would like the suture removal on 2/21 and 2/27 (approximately 12 days after each surgery which was on 2/9 and 2/14). Please call to schedule these appointments on Monday.     Referrals for Physical, Occupational, and Speech therapy has been sent to [facility] (located at the [facility]).     Follow up with Dr. Humphrey Rolls for Urology as needed, pending goals moving forwards.

## 2021-08-04 NOTE — Progress Notes (Signed)
[facility] 4A  Occupational Therapy  Daily Note  Time:   Time In: 3664  Time Out: 1123  Timed Code Treatment Minutes: 26 Minutes  Minutes: 26      Date: 08/04/2021  Patient Name: Marcus Burnett,   Gender: male      Room: 4A-17/017-A  MRN: [id number]  DOB: [birth year]  (60 y.o.)  Referring Practitioner: Honor Loh, APRN-CNP  Diagnosis: neoplasm of brain causing mass effect on adjacent structures.  Additional Pertinent Hx: per chart review; "Marcus Burnett is a 61 y.o. male who presents for evaluation. Apparently for the past 6 weeks patient has had a decline in his mental health and cognitive skills.Patient worked at [employer] and had a very stressful job.  They both attributed development of depression to job stress.  His behavior changed and slowed. His gait is different and he has blank stare on his face at times.  Patient has trouble communicating his thoughts. 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  Wife was told that "they got everything."  Her and her husband were aware that this type of cancer could spread to lung and brain however they were not informed that there was any metastasis. 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 s/p Left Craniotomy Resection of Tumor on 07/31/2021.    Restrictions/Precautions:  Restrictions/Precautions: General Precautions, Fall Risk, Surgical Protocols     SUBJECTIVE: Patient pleasant and cooperative. Agreeable to OT. RN ok'ed session.     PAIN: Denies     Vitals: Vitals not assessed per clinical judgement, see nursing flowsheet    COGNITION: Slow Processing    ADL:   Grooming: Supervision.  Hand hygiene   Toileting: Supervision.  No LOB  Toilet Transfer: Supervision.   Marland Kitchen  Appropriate sequencing of toileting routine, no external cue'ing required     BALANCE:  Sitting Balance:  Independent.    Standing Balance: Supervision.      BED MOBILITY:  Not  Tested    TRANSFERS:  Sit to Stand:  Supervision. Recliner, EOM. Good hand placement.  Stand to Sit: Supervision.      FUNCTIONAL MOBILITY:  Assistive Device: None  Assist Level:  Stand By Assistance.   Distance: To and from bathroom, to/from 4A gym   No LOB      ADDITIONAL ACTIVITIES:  Patient completed dynamic standing / card activity that promoted: thought organization and sequencing as well as facilitated standing balance without UE support and standing. Pt stood > 10 min with supervision and no LOB. Appropriate with numerical sequencing. Pt was also able to explain card game details with min increased time but no cues required for thought organization. All completed to challenge: standing endurance, balance and functional cognition for return to IADL skills.     Patient completed BUE strengthening exercises with skilled education on HEP: completed x15 reps x1 set with a med resistive band in all joints and all planes in order to improve UE strength and activity tolerance required for BADL routine and toilet / shower transfers. Patient tolerated well, requiring min rest breaks. Patient also required min cues for technique. HEP handout provided         ASSESSMENT:     Activity Tolerance:  Patient tolerance of  treatment: Good treatment tolerance      Discharge Recommendations: outpatient OT if pt has transport vs Home with Home Health OT  Equipment Recommendations: Equipment Needed:  No  Plan: Times Per Week: 5x  Times Per Day: Once a day  Current Treatment Recommendations: Strengthening, Balance training, Functional mobility training, Endurance training, Safety education & training, Neuromuscular re-education, Patient/Caregiver education & training, Self-Care / ADL, Coordination training    Patient Education  Patient Education:  strength/ HEP, balance/endurance building     Goals  Short Term Goals  Time Frame for Short Term Goals: by discharge  Short Term Goal 1: patient will tolerate standing >10 minutes with  supervision in prep for ADL tasks.  Short Term Goal 2: patient will functionally ambulate house hold distances with (S) with 0-1 cues for safety and sequencing.  Short Term Goal 3: patient will complete ADL routine with (S) with 0-1 cues for safety awareness.  Short Term Goal 4: patient will participate in moderate resistive UB Exer to increase UB strength for functional transfers.    Following session, patient left in safe position with all fall risk precautions in place.
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
## 2021-08-04 NOTE — Care Coordination-Inpatient (Addendum)
08/04/21, 11:25 AM EST    Spoke with [name]; he declined a Rolling walker as he has several at home. To  continue OP PT, OT, SLP      Orders to be faxed [facility] facility.    Called wife; no answer.  LM.      1250- spoke with wife; updated on discharge plan.  Denied questions.        Patient goals/plan/ treatment preferences discussed by Case Manager and Education officer, museum.  Patient goals/plan/ treatment preferences reviewed with patient/ family.  Patient/ family verbalize understanding of discharge plan and are in agreement with goal/plan/treatment preferences.  Understanding was demonstrated using the teach back method.  AVS provided by RN at time of discharge, which includes all necessary medical information pertaining to the patients current course of illness, treatment, post-discharge goals of care, and treatment preferences.     Services At/After Discharge: Outpatient, OT, PT, and SLP

## 2021-08-04 NOTE — Discharge Instructions (Signed)
No driving or operating heavy machinery until cleared by neurology.

## 2021-08-04 NOTE — Discharge Instructions (Addendum)
Good nutrition is important when healing from an illness, injury, or surgery.  Follow any nutrition recommendations given to you during your hospital stay.   If you were given an oral nutrition supplement while in the hospital, continue to take this supplement at home.  You can take it with meals, in-between meals, and/or before bedtime. These supplements can be purchased at most local grocery stores, pharmacies, and chain super-stores.   If you have any questions about your diet or nutrition, call the hospital and ask for the dietitian.

## 2021-08-08 ENCOUNTER — Inpatient Hospital Stay
Admit: 2021-08-08 | Discharge: 2021-08-08 | Disposition: A | Discharge status: Home / Self Care | Payer: BLUE CROSS/BLUE SHIELD | Attending: Emergency Medicine

## 2021-08-08 DIAGNOSIS — L7682 Other postprocedural complications of skin and subcutaneous tissue: Secondary | ICD-10-CM

## 2021-08-08 DIAGNOSIS — T8189XA Other complications of procedures, not elsewhere classified, initial encounter: Secondary | ICD-10-CM

## 2021-08-08 MED ORDER — BACITRACIN 500 UNIT/GM EX OINT
500 UNIT/GM | Freq: Once | CUTANEOUS | Status: AC
Start: 2021-08-08 — End: 2021-08-08
  Administered 2021-08-08: 17:00:00 via TOPICAL

## 2021-08-08 MED FILL — BACITRACIN 500 UNIT/GM EX OINT: 500 UNIT/GM | CUTANEOUS | Qty: 28

## 2021-08-08 NOTE — ED Notes (Signed)
Pt to ED via intake with c/o post op bleeding from their craniotomy on 2/14. Dr Jonne Ply completed both craniotomies. Pt is A&Ox4. Pt denies pain. Pt VSS.      Levy Sjogren, RN  08/08/21 [phone]

## 2021-08-08 NOTE — Telephone Encounter (Signed)
Patient wife Blair Promise called stating patient is "leaking fluid" from the left side where he underwent a craniotomy. I spoke with Brayton El PA-C informed him of what Sheryl stated. Iona Beard states patient needs to go to the ER to be evaluated to make sure there is no infection in the wound. Iona Beard states infectious disease may need contacted for evaluation. I called back and spoke to the patient and informed him of what Iona Beard stated. Patient verbalized understanding.

## 2021-08-08 NOTE — ED Provider Notes (Signed)
[facility]      CHIEF COMPLAINT       Chief Complaint   Patient presents with    Post-op Problem       Nurses Notes reviewed and I agree except as noted in the HPI.      HISTORY OF PRESENT ILLNESS    Marcus Burnett is a 61 y.o. male who presents with complaint of drainage from a recent surgical site, patient had 2 craniotomies, discharged home approximately a week ago.  The patient said that he was taking a shower this morning, noticed that her dressing on the left parietal region had some clear drainage.  Denies any neck pain, no trauma, no headaches, no change in sensorium.  Onset: Acute  Duration: Prior to arrival  Timing:   Location of Pain: No pain  Intesity/severity: Mild drainage  Modifying Factors: Recent craniotomy  Relieved by;  Previous Episodes;  Tx Before arrival: None  REVIEW OF SYSTEMS      Review of Systems   Constitutional: Negative for fever, chills, diaphoresis and fatigue.   HENT: Negative for congestion, drooling, facial swelling and sore throat.    Eyes: Negative for photophobia, pain and discharge.   Respiratory: Negative for cough, shortness of breath, wheezing and stridor.    Cardiovascular: Negative for chest pain, palpitations and leg swelling.   Gastrointestinal: Negative for abdominal pain, blood in stool and abdominal distention.   Genitourinary: Negative for dysuria, urgency, hematuria and difficulty urinating.   Musculoskeletal: Negative for gait problem, neck pain and neck stiffness.   Skin; No rash, No itching  Neurological: Negative for seizures, weakness and numbness.   Hematological: Negative for adenopathy. Does not bruise/bleed easily.     PAST MEDICAL HISTORY    has no past medical history on file.    SURGICAL HISTORY      has a past surgical history that includes craniotomy (Right, 07/26/2021) and craniotomy (Left, 07/31/2021).    CURRENT MEDICATIONS       Discharge Medication List as of 08/08/2021 11:28 AM        CONTINUE these medications which have NOT CHANGED     Details   levETIRAcetam (KEPPRA) 500 MG tablet Take 1 tablet by mouth 2 times daily, Disp-60 tablet, R-0Normal      famotidine (PEPCID) 20 MG tablet Take 1 tablet by mouth 2 times daily, Disp-60 tablet, R-3Normal      amLODIPine (NORVASC) 10 MG tablet Take 0.5 tablets by mouth daily, Disp-30 tablet, [id number] Sig      TRULICITY 1.5 VP/7.1GG SOPN INJECT 0.5 ML (1.5MG  DOSE) INTO THE SKIN EVERY 7 DAYS, DAWHistorical Med      metFORMIN (GLUCOPHAGE) 500 MG tablet TAKE 2 TABLETS (1,000 MG TOTAL) BY MOUTH 2 TIMES DAILY WITH MEALS.Historical Med      glipiZIDE (GLUCOTROL XL) 10 MG extended release tablet TAKE 1 TABLET BY MOUTH EVERY DAYHistorical Med      pioglitazone (ACTOS) 15 MG tablet TAKE 1 TABLET (15 MG TOTAL) BY MOUTH DAILY.Historical Med      lisinopril (PRINIVIL;ZESTRIL) 40 MG tablet TAKE 1 TABLET BY MOUTH EVERY DAYHistorical Med      pravastatin (PRAVACHOL) 80 MG tablet TAKE 1 TABLET BY MOUTH EVERY DAYHistorical Med             ALLERGIES     has No Known Allergies.    FAMILY HISTORY     has no family status information on file.    family history is not on file.  SOCIAL HISTORY      reports that he has never smoked. He has never used smokeless tobacco. He reports that he does not drink alcohol and does not use drugs.    PHYSICAL EXAM     INITIAL VITALS:  oral temperature is 97.8 ??F (36.6 ??C). His blood pressure is 131/84 and his pulse is 68. His respiration is 17 and oxygen saturation is 98%.    Physical Exam   Constitutional:  well-developed and well-nourished.   HENT: Head: Normocephalic, atraumatic, Bilateral external ears normal, Oropharynx mosit, No oral exudates, Nose normal.   Incision sites are intact to left cranium, right posterior cranium, no drainage, no leakage.  Eyes: PERRL, EOMI, Conjunctiva normal, No discharge. No scleral icterus  Neck: Normal range of motion, No tenderness, Supple  Cardiovascular: Normal rate, regular rhythm, S1 normal and S2 normal.  Exam reveals no gallop.     Pulmonary/Chest: Effort normal and breath sounds normal. No accessory muscle usage or stridor. No respiratory distress.  no wheezes. has no rales. exhibits no tenderness.   Abdominal: Soft. Bowel sounds are normal.  exhibits no distension. There is no tenderness. There is no rebound and no guarding.   Extremities: No edema, no tenderness, no cyanosis, no clubbing.    Musculoskeletal: Good range of motion in major joints is observed.  No major deformities noted.  Neurological: Alert and oriented ??3, normal motor function, normal sensory function, no focal deficits.  GCS 15  Skin: Skin is warm, dry and intact. No rash noted. No erythema.   Psychiatric: Affect normal, judgment normal, mood normal.  DIFFERENTIAL DIAGNOSIS:           MEDICAL DECISION MAKING / ED COURSE:     1) Number and Complexity of Problems            Problem List This Visit:         Chief Complaint   Patient presents with    Post-op Problem            Differential Diagnosis includes (but not limited to):  Surgical site infection, wound dehiscence        Diagnoses Considered but I have low suspicion of:   Wound dehiscence             Pertinent Comorbid Conditions:    Recent craniotomy    2)  Data Reviewed (none if left blank)          My Independent interpretations:     EKG:      None    Imaging: None    Labs:      None                 Decision Rules/Clinical Scores utilized:              External Documentation Reviewed:         Previous patient encounter documents & history available on EMR was reviewed              See Formal Diagnostic Results above for the lab and radiology tests and orders.    3)  Treatment and Disposition         ED Reassessment: Stable         Case discussed with consulting clinician: Neurosurgery         Shared Decision-Making was performed and disposition discussed with the        Patient/Family and questions answered          Social  determinants of health impacting treatment or disposition:           Code Status:   FULL      Summary of Patient Presentation:      MDM  /   Vitals Reviewed:    Vitals:    08/08/21 0931 08/08/21 0933   BP:  131/84   Pulse: 69 68   Resp: 17 17   Temp:  97.8 ??F (36.6 ??C)   TempSrc:  Oral   SpO2: 98%        The patient was seen and examined. Appropriate diagnostic testing was performed and results reviewed with the patient.      The results of pertinent diagnostic studies and exam findings were discussed. The patient???s provisional diagnosis and plan of care were discussed with the patient and present family who expressed understanding. Any medications were reviewed and indications and risks of medications were discussed with the patient /family present. Strict verbal and written return precautions, instructions and appropriate follow-up provided to  the patient      ED Medications administered this visit:  (None if blank)  Medications   bacitracin ointment ( Topical Given 08/08/21 1202)               DIAGNOSTIC RESULTS     EKG: All EKG's are interpreted by the Emergency Department Physician who either signs or Co-signs this chart in the absence of a cardiologist.      RADIOLOGY: non-plain film images(s) such as CT, Ultrasound and MRI are read by the radiologist.  Plain radiographic images are visualized and preliminarily interpreted by the emergency physician unless otherwise stated below.      LABS:   Labs Reviewed - No data to display    EMERGENCY DEPARTMENT COURSE:   Vitals:    Vitals:    08/08/21 0931 08/08/21 0933   BP:  131/84   Pulse: 69 68   Resp: 17 17   Temp:  97.8 ??F (36.6 ??C)   TempSrc:  Oral   SpO2: 98%      Dressing removed, as incision line appears intact, no drainage.  Patient evaluated by neurosurgery in the ED, cleared for discharge.    CRITICAL CARE:       CONSULTS:  None    PROCEDURES:  none    FINAL IMPRESSION      1. Swelling of surgical site, initial encounter          DISPOSITION/PLAN   Decision To Discharge    PATIENT REFERRED TO:  Illene Labrador, MD  [address]  [address]  [place] [address]  [phone]    Schedule an appointment as soon as possible for a visit   Follow-up    DISCHARGE MEDICATIONS:  Discharge Medication List as of 08/08/2021 11:28 AM          (Please note that portions of this note were completed with a voice recognition program.  Efforts were made to edit the dictations but occasionally words are mis-transcribed.)    Humberto Leep, DO             Humberto Leep, DO  08/09/21 1146

## 2021-08-13 ENCOUNTER — Ambulatory Visit
Admit: 2021-08-13 | Discharge: 2021-08-13 | Payer: BLUE CROSS/BLUE SHIELD | Attending: Neurological Surgery | Primary: Family Medicine

## 2021-08-13 DIAGNOSIS — C7931 Secondary malignant neoplasm of brain: Secondary | ICD-10-CM

## 2021-08-13 NOTE — Progress Notes (Signed)
[place] [address]  [place] [address]  Dept: [phone]  Dept Fax: [phone]  Loc: [facility] Follow Visit  Visit Date: 08/13/2021      Marcus Burnett  is a 61 y.o. male who is returning to the office today for a post-op follow-up visit. 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    Patient was evaluated today and is doing well overall.  No new complaints were voiced.  Patient  lives with their family  Wound: wound healing as expected  Follow-up Studies:   Orders Placed This Encounter   Procedures    MRI BRAIN W WO CONTRAST        Assessment/Plan:  Status Post craniotomy  Doing well overall  Encouraged gradual increase in physical and mental activity.  Patient refused to do any radiation or chemo therapies.  Fall precaution and home safety education provided to patient.  Follow-up: 4 to 6 weeks with new brain MRI with and without contrast  The case was discussed in detail with the patient and his wife all agreement with the recommendation treatment plan at this time.      Electronically signed by Illene Labrador, MD on 08/13/21 at 10:12 AM EST

## 2021-08-16 ENCOUNTER — Ambulatory Visit
Admit: 2021-08-16 | Discharge: 2021-08-16 | Payer: MEDICAID | Attending: Hematology & Oncology | Primary: Family Medicine

## 2021-08-16 ENCOUNTER — Inpatient Hospital Stay: Admit: 2021-08-16 | Payer: MEDICAID | Primary: Family Medicine

## 2021-08-16 DIAGNOSIS — C642 Malignant neoplasm of left kidney, except renal pelvis: Secondary | ICD-10-CM
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
## 2021-08-16 NOTE — Patient Instructions (Addendum)
Reviewed labs and recent medical history.  Discussed treatment options including radiation and immunotherapy.  Patient will continue to consider radiation to his brain. Encouraged him to call [name] to get an appt.  [phone]  Discussed immunotherapy options including Keytruda and Axitinib. These are medications that we can consider after radiation.  Discussed Palliative Care including Hospice Care if patient decides not to pursue treatment.  Will have Chaplain call patient and spouse about Living Will/Durable Power of [place].  Does not need a follow up appt at this time. They will decide and let us know about a return appt.

## 2021-08-16 NOTE — Progress Notes (Signed)
See Physician's progress note.

## 2021-08-17 ENCOUNTER — Inpatient Hospital Stay: Admit: 2021-08-17 | Payer: BLUE CROSS/BLUE SHIELD | Primary: Family Medicine
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
## 2021-08-21 ENCOUNTER — Inpatient Hospital Stay: Admit: 2021-08-21 | Payer: BLUE CROSS/BLUE SHIELD | Primary: Family Medicine
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
## 2021-08-22 ENCOUNTER — Emergency Department: Admit: 2021-08-22 | Payer: MEDICAID | Primary: Family Medicine

## 2021-08-22 ENCOUNTER — Inpatient Hospital Stay
Admit: 2021-08-22 | Discharge: 2021-08-24 | Disposition: A | Discharge status: Hospice | Payer: MEDICAID | Admitting: Student in an Organized Health Care Education/Training Program

## 2021-08-22 DIAGNOSIS — T8131XA Disruption of external operation (surgical) wound, not elsewhere classified, initial encounter: Secondary | ICD-10-CM

## 2021-08-22 DIAGNOSIS — T8130XA Disruption of wound, unspecified, initial encounter: Secondary | ICD-10-CM

## 2021-08-22 LAB — COVID-19 & INFLUENZA COMBO
INFLUENZA A: NOT DETECTED
INFLUENZA B: NOT DETECTED
SARS-CoV-2 RNA, RT PCR: NOT DETECTED

## 2021-08-22 LAB — CBC WITH AUTO DIFFERENTIAL
Basophils Absolute: 0.1 10*3/uL (ref 0.0–0.1)
Basophils: 1 %
Eosinophils Absolute: 0.2 10*3/uL (ref 0.0–0.4)
Eosinophils: 2.8 %
Hematocrit: 35.4 % — ABNORMAL LOW (ref 42.0–52.0)
Hemoglobin: 11.7 gm/dl — ABNORMAL LOW (ref 14.0–18.0)
Immature Grans (Abs): 0.02 10*3/uL (ref 0.00–0.07)
Immature Granulocytes: 0.3 %
Lymphocytes Absolute: 1.6 10*3/uL (ref 1.0–4.8)
Lymphocytes: 23.8 %
MCH: 29 pg (ref 26.0–33.0)
MCHC: 33.1 gm/dl (ref 32.2–35.5)
MCV: 87.8 fL (ref 80.0–94.0)
MPV: 9.6 fL (ref 9.4–12.4)
Monocytes Absolute: 0.6 10*3/uL (ref 0.4–1.3)
Monocytes: 8 %
Platelets: 375 10*3/uL (ref 130–400)
RBC: 4.03 10*6/uL — ABNORMAL LOW (ref 4.70–6.10)
RDW-CV: 13.7 % (ref 11.5–14.5)
RDW-SD: 43.8 fL (ref 35.0–45.0)
Seg Neutrophils: 64.1 %
Segs Absolute: 4.4 10*3/uL (ref 1.8–7.7)
WBC: 6.9 10*3/uL (ref 4.8–10.8)
nRBC: 0 /100 wbc

## 2021-08-22 LAB — BASIC METABOLIC PANEL
BUN: 22 mg/dL (ref 7–22)
CO2: 22 meq/L — ABNORMAL LOW (ref 23–33)
Calcium: 9.2 mg/dL (ref 8.5–10.5)
Chloride: 104 meq/L (ref 98–111)
Creatinine: 1.2 mg/dL (ref 0.4–1.2)
Glucose: 188 mg/dL — ABNORMAL HIGH (ref 70–108)
Potassium: 4.2 meq/L (ref 3.5–5.2)
Sodium: 137 meq/L (ref 135–145)

## 2021-08-22 LAB — ANION GAP: Anion Gap: 11 meq/L (ref 8.0–16.0)

## 2021-08-22 LAB — APTT: aPTT: 27.6 seconds (ref 22.0–38.0)

## 2021-08-22 LAB — PROTIME-INR: INR: 0.96 (ref 0.85–1.13)

## 2021-08-22 LAB — GLOMERULAR FILTRATION RATE, ESTIMATED: Est, Glom Filt Rate: >60 mL/min/{1.73_m2} (ref >60)

## 2021-08-22 LAB — OSMOLALITY: Osmolality Calc: 282.1 mOsmol/kg (ref 275.0–300.0)

## 2021-08-22 MED ORDER — PHENYLEPHRINE HCL 10 MG/ML SOLN (MIXTURES ONLY)
10 MG/ML | Status: DC | PRN
Start: 2021-08-22 — End: 2021-08-22
  Administered 2021-08-22 (×2): 200 via INTRAVENOUS
  Administered 2021-08-22: 22:00:00 100 via INTRAVENOUS

## 2021-08-22 MED ORDER — CEFAZOLIN SODIUM 1 G IJ SOLR
1 g | INTRAMUSCULAR | Status: AC
Start: 2021-08-22 — End: ?

## 2021-08-22 MED ORDER — SUGAMMADEX SODIUM 200 MG/2ML IV SOLN
200 MG/2ML | INTRAVENOUS | Status: DC | PRN
Start: 2021-08-22 — End: 2021-08-22
  Administered 2021-08-22: 23:00:00 200 via INTRAVENOUS

## 2021-08-22 MED ORDER — ONDANSETRON HCL 4 MG/2ML IJ SOLN
4 MG/2ML | INTRAMUSCULAR | Status: DC | PRN
Start: 2021-08-22 — End: 2021-08-22
  Administered 2021-08-22: 23:00:00 4 via INTRAVENOUS

## 2021-08-22 MED ORDER — PROPOFOL 200 MG/20ML IV EMUL
200 MG/20ML | INTRAVENOUS | Status: DC | PRN
Start: 2021-08-22 — End: 2021-08-22
  Administered 2021-08-22: 22:00:00 200 via INTRAVENOUS

## 2021-08-22 MED ORDER — SODIUM CHLORIDE 0.9 % IV SOLN
0.9 % | INTRAVENOUS | Status: DC | PRN
Start: 2021-08-22 — End: 2021-08-22

## 2021-08-22 MED ORDER — PROPOFOL 200 MG/20ML IV EMUL
200 MG/20ML | INTRAVENOUS | Status: AC
Start: 2021-08-22 — End: ?

## 2021-08-22 MED ORDER — EPHEDRINE SULFATE-NACL 50-0.9 MG/5ML-% IV SOSY
INTRAVENOUS | Status: AC
Start: 2021-08-22 — End: ?

## 2021-08-22 MED ORDER — SUCCINYLCHOLINE CHLORIDE 200 MG/10ML IV SOSY
200 MG/10ML | INTRAVENOUS | Status: AC
Start: 2021-08-22 — End: ?

## 2021-08-22 MED ORDER — MORPHINE SULFATE (PF) 2 MG/ML IV SOLN
2 MG/ML | INTRAVENOUS | Status: DC | PRN
Start: 2021-08-22 — End: 2021-08-22

## 2021-08-22 MED ORDER — BACITRACIN 500 UNIT/GM EX OINT
500 UNIT/GM | CUTANEOUS | Status: AC
Start: 2021-08-22 — End: ?

## 2021-08-22 MED ORDER — LIDOCAINE HCL (CARDIAC) 100 MG/5ML IV SOSY
100 MG/5ML | INTRAVENOUS | Status: AC
Start: 2021-08-22 — End: ?

## 2021-08-22 MED ORDER — ONDANSETRON HCL 4 MG/2ML IJ SOLN
4 MG/2ML | INTRAMUSCULAR | Status: AC
Start: 2021-08-22 — End: ?

## 2021-08-22 MED ORDER — BACITRACIN 500 UNIT/GM EX OINT
500 UNIT/GM | CUTANEOUS | Status: DC | PRN
Start: 2021-08-22 — End: 2021-08-22
  Administered 2021-08-22: 23:00:00 1 via TOPICAL

## 2021-08-22 MED ORDER — NORMAL SALINE FLUSH 0.9 % IV SOLN
0.9 % | INTRAVENOUS | Status: DC | PRN
Start: 2021-08-22 — End: 2021-08-22

## 2021-08-22 MED ORDER — FENTANYL CITRATE (PF) 100 MCG/2ML IJ SOLN
100 MCG/2ML | INTRAMUSCULAR | Status: DC | PRN
Start: 2021-08-22 — End: 2021-08-22
  Administered 2021-08-22: 22:00:00 100 via INTRAVENOUS

## 2021-08-22 MED ORDER — CEFAZOLIN SODIUM 1 G IJ SOLR
1 g | INTRAMUSCULAR | Status: DC | PRN
Start: 2021-08-22 — End: 2021-08-22
  Administered 2021-08-22: 22:00:00 2 via INTRAVENOUS

## 2021-08-22 MED ORDER — ROCURONIUM BROMIDE 50 MG/5ML IV SOLN
50 MG/5ML | INTRAVENOUS | Status: DC | PRN
Start: 2021-08-22 — End: 2021-08-22
  Administered 2021-08-22: 22:00:00 20 via INTRAVENOUS

## 2021-08-22 MED ORDER — LABETALOL HCL 5 MG/ML IV SOLN
5 MG/ML | INTRAVENOUS | Status: DC | PRN
Start: 2021-08-22 — End: 2021-08-22

## 2021-08-22 MED ORDER — FENTANYL CITRATE (PF) 100 MCG/2ML IJ SOLN
100 MCG/2ML | INTRAMUSCULAR | Status: DC | PRN
Start: 2021-08-22 — End: 2021-08-22

## 2021-08-22 MED ORDER — SODIUM CHLORIDE 0.9 % IV SOLN
0.9 % | INTRAVENOUS | Status: DC | PRN
Start: 2021-08-22 — End: 2021-08-22
  Administered 2021-08-22: 22:00:00 via INTRAVENOUS

## 2021-08-22 MED ORDER — NORMAL SALINE FLUSH 0.9 % IV SOLN
0.9 % | Freq: Two times a day (BID) | INTRAVENOUS | Status: DC
Start: 2021-08-22 — End: 2021-08-22

## 2021-08-22 MED ORDER — SUCCINYLCHOLINE CHLORIDE 200 MG/10ML IV SOSY
200 MG/10ML | INTRAVENOUS | Status: DC | PRN
Start: 2021-08-22 — End: 2021-08-22
  Administered 2021-08-22: 22:00:00 160 via INTRAVENOUS

## 2021-08-22 MED ORDER — SUGAMMADEX SODIUM 200 MG/2ML IV SOLN
200 MG/2ML | INTRAVENOUS | Status: AC
Start: 2021-08-22 — End: ?

## 2021-08-22 MED ORDER — FENTANYL CITRATE (PF) 100 MCG/2ML IJ SOLN
100 MCG/2ML | INTRAMUSCULAR | Status: AC
Start: 2021-08-22 — End: ?

## 2021-08-22 MED ORDER — EPHEDRINE SULFATE-NACL 50-0.9 MG/5ML-% IV SOSY
INTRAVENOUS | Status: DC | PRN
Start: 2021-08-22 — End: 2021-08-22
  Administered 2021-08-22 (×3): 10 via INTRAVENOUS
  Administered 2021-08-22: 22:00:00 20 via INTRAVENOUS

## 2021-08-22 MED ORDER — LIDOCAINE HCL (CARDIAC) 100 MG/5ML IV SOSY
100 MG/5ML | INTRAVENOUS | Status: DC | PRN
Start: 2021-08-22 — End: 2021-08-22
  Administered 2021-08-22: 22:00:00 100 via INTRAVENOUS

## 2021-08-22 MED ORDER — IOPAMIDOL 76 % IV SOLN
76 % | Freq: Once | INTRAVENOUS | Status: AC | PRN
Start: 2021-08-22 — End: 2021-08-22
  Administered 2021-08-22: 20:00:00 75 mL via INTRAVENOUS

## 2021-08-22 MED ORDER — ROCURONIUM BROMIDE 50 MG/5ML IV SOLN
50 MG/5ML | INTRAVENOUS | Status: AC
Start: 2021-08-22 — End: ?

## 2021-08-22 MED FILL — PROPOFOL 200 MG/20ML IV EMUL: 200 MG/20ML | INTRAVENOUS | Qty: 20

## 2021-08-22 MED FILL — ONDANSETRON HCL 4 MG/2ML IJ SOLN: 4 MG/2ML | INTRAMUSCULAR | Qty: 2

## 2021-08-22 MED FILL — FENTANYL CITRATE (PF) 100 MCG/2ML IJ SOLN: 100 MCG/2ML | INTRAMUSCULAR | Qty: 2

## 2021-08-22 MED FILL — ROCURONIUM BROMIDE 50 MG/5ML IV SOLN: 50 MG/5ML | INTRAVENOUS | Qty: 5

## 2021-08-22 MED FILL — EPHEDRINE SULFATE-NACL 50-0.9 MG/5ML-% IV SOSY: INTRAVENOUS | Qty: 5

## 2021-08-22 MED FILL — BACITRACIN 500 UNIT/GM EX OINT: 500 UNIT/GM | CUTANEOUS | Qty: 28

## 2021-08-22 MED FILL — CEFAZOLIN SODIUM 1 G IJ SOLR: 1 g | INTRAMUSCULAR | Qty: 1000

## 2021-08-22 MED FILL — LIDOCAINE HCL (CARDIAC) 100 MG/5ML IV SOSY: 100 MG/5ML | INTRAVENOUS | Qty: 5

## 2021-08-22 MED FILL — SUCCINYLCHOLINE CHLORIDE 200 MG/10ML IV SOSY: 200 MG/10ML | INTRAVENOUS | Qty: 10

## 2021-08-22 MED FILL — BRIDION 200 MG/2ML IV SOLN: 200 MG/2ML | INTRAVENOUS | Qty: 2

## 2021-08-22 NOTE — Progress Notes (Signed)
Patient admitted to 4A Room 19 from surgery  Complaint upon arrival to the room post op I & D  Vital signs obtained. Assessment and data collection initiated. Oriented to room. Policies and procedures for 4a explained All questions answered with no further questions at this time. Fall prevention and safety brochure discussed with patient. 2 person skin check completed.

## 2021-08-22 NOTE — ED Notes (Signed)
Presents to ER with concern of wound opening after brain surgery 02. Pt reports no fever,chilld or foul odor. There is yellow pus like drainage. Surgical hardware exposed underneath drainage. Dr. Jonne Ply is neurologist.      Rozanna Boer, RN  08/22/21 1120

## 2021-08-22 NOTE — Plan of Care (Signed)
Problem: Discharge Planning  Goal: Discharge to home or other facility with appropriate resources  Outcome: Progressing  Flowsheets (Taken 08/22/2021 2158)  Discharge to home or other facility with appropriate resources: Identify barriers to discharge with patient and caregiver     Problem: Safety - Adult  Goal: Free from fall injury  Outcome: Progressing  Flowsheets (Taken 08/22/2021 2158)  Free From Fall Injury: Instruct family/caregiver on patient safety     Problem: Skin/Tissue Integrity  Goal: Absence of new skin breakdown  Description: 1.  Monitor for areas of redness and/or skin breakdown  2.  Assess vascular access sites hourly  3.  Every 4-6 hours minimum:  Change oxygen saturation probe site  4.  Every 4-6 hours:  If on nasal continuous positive airway pressure, respiratory therapy assess nares and determine need for appliance change or resting period.  Outcome: Progressing  Note: No signs of skin breakdown.  Skin warm, dry, and intact.  Mucous membranes pink and moist.  Assistance with turns/ambulation provided PRN.  Will continue to monitor.       Problem: Pain  Goal: Verbalizes/displays adequate comfort level or baseline comfort level  Outcome: Progressing  Flowsheets (Taken 08/22/2021 2158)  Verbalizes/displays adequate comfort level or baseline comfort level:   Encourage patient to monitor pain and request assistance   Assess pain using appropriate pain scale   Care plan reviewed with patient.  Patient verbalizes understanding of the plan of care and contributed to goal setting.

## 2021-08-22 NOTE — Progress Notes (Signed)
This Virtual RN completed admission requirements. Call light within reach. Educated patient to use call light and to notify bedside nurse if there is a change in condition or if any help is needed.

## 2021-08-22 NOTE — Progress Notes (Signed)
Arrived in OR holding, checked in. Cell phone given to family. Family directed to waiting room.

## 2021-08-22 NOTE — Anesthesia Pre-Procedure Evaluation (Signed)
Department of Anesthesiology  Preprocedure Note       Name:  Marcus Burnett   Age:  61 y.o.  DOB:  [birth year]                                          MRN:  [id number]         Date:  08/22/2021      Surgeon: Juliann Mule):  Darrol Angel, MD    Procedure: Procedure(s):  I & D AND CLOSURE FOR WOUND DEHISENCE    Medications prior to admission:   Prior to Admission medications    Medication Sig Start Date End Date Taking? Authorizing Provider   levETIRAcetam (KEPPRA) 500 MG tablet Take 1 tablet by mouth 2 times daily 08/04/21 09/03/21  Amy Ardeen Jourdain, DO   famotidine (PEPCID) 20 MG tablet Take 1 tablet by mouth 2 times daily 08/04/21   Amy M Darel Hong, DO   amLODIPine (NORVASC) 10 MG tablet Take 0.5 tablets by mouth daily 08/05/21   Artis Delay, DO   TRULICITY 1.5 ZS/0.1UX SOPN INJECT 0.5 ML (1.'5MG'$  DOSE) INTO THE SKIN EVERY 7 DAYS 11/01/20   Historical Provider, MD   metFORMIN (GLUCOPHAGE) 500 MG tablet TAKE 2 TABLETS (1,000 MG TOTAL) BY MOUTH 2 TIMES DAILY WITH MEALS. 11/06/20   Historical Provider, MD   glipiZIDE (GLUCOTROL XL) 10 MG extended release tablet TAKE 1 TABLET BY MOUTH EVERY DAY 10/10/20   Historical Provider, MD   pioglitazone (ACTOS) 15 MG tablet TAKE 1 TABLET (15 MG TOTAL) BY MOUTH DAILY. 10/09/20   Historical Provider, MD   lisinopril (PRINIVIL;ZESTRIL) 40 MG tablet TAKE 1 TABLET BY MOUTH EVERY DAY 11/19/20   Historical Provider, MD   pravastatin (PRAVACHOL) 80 MG tablet TAKE 1 TABLET BY MOUTH EVERY DAY 10/29/20   Historical Provider, MD       Current medications:    No current facility-administered medications for this encounter.     Current Outpatient Medications   Medication Sig Dispense Refill   ??? levETIRAcetam (KEPPRA) 500 MG tablet Take 1 tablet by mouth 2 times daily 60 tablet 0   ??? famotidine (PEPCID) 20 MG tablet Take 1 tablet by mouth 2 times daily 60 tablet 3   ??? amLODIPine (NORVASC) 10 MG tablet Take 0.5 tablets by mouth daily 30 tablet 3   ??? TRULICITY 1.5 NA/3.5TD SOPN INJECT 0.5 ML (1.'5MG'$  DOSE) INTO THE  SKIN EVERY 7 DAYS     ??? metFORMIN (GLUCOPHAGE) 500 MG tablet TAKE 2 TABLETS (1,000 MG TOTAL) BY MOUTH 2 TIMES DAILY WITH MEALS.     ??? glipiZIDE (GLUCOTROL XL) 10 MG extended release tablet TAKE 1 TABLET BY MOUTH EVERY DAY     ??? pioglitazone (ACTOS) 15 MG tablet TAKE 1 TABLET (15 MG TOTAL) BY MOUTH DAILY.     ??? lisinopril (PRINIVIL;ZESTRIL) 40 MG tablet TAKE 1 TABLET BY MOUTH EVERY DAY     ??? pravastatin (PRAVACHOL) 80 MG tablet TAKE 1 TABLET BY MOUTH EVERY DAY         Allergies:  No Known Allergies    Problem List:    Patient Active Problem List   Diagnosis Code   ??? 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 Altered mental status R41.82   ??? 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 Renal cell carcinoma ([place])  C64.9   ??? Goals of care, counseling/discussion Z71.89       Past Medical History:        Diagnosis Date   ??? Cancer ([place])    ??? Diabetes mellitus ([place])    ??? Hypertension        Past Surgical History:        Procedure Laterality Date   ??? CRANIOTOMY Right 07/26/2021    RIGHT SIDED CRANIOTOMY FOR 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 performed by Illene Labrador, MD at [place]   ??? CRANIOTOMY Left 07/31/2021    Left Craniotomy Resection of Tumor performed by Illene Labrador, MD at [place]   ??? KIDNEY REMOVAL         Social History:    Social History     Tobacco Use   ??? Smoking status: Never   ??? Smokeless tobacco: Never   Substance Use Topics   ??? Alcohol use: Never                                Counseling given: Not Answered      Vital Signs (Current):   Vitals:    08/22/21 1124 08/22/21 1247 08/22/21 1423 08/22/21 1553   BP:  119/68     Pulse:  65 65 60   Resp:  '19 17 17   '$ Temp:       TempSrc:       SpO2:  97% 95% 97%   Weight: 256 lb (116.1 kg)                                                 BP Readings from Last 3 Encounters:   08/22/21 119/68   08/16/21 131/74   08/16/21 131/74       NPO Status:                                                                                  BMI:   Wt Readings from Last 3 Encounters:   08/22/21 256 lb (116.1 kg)   08/16/21 256 lb (116.1 kg)   08/16/21 256 lb (116.1 kg)     Body mass index is 36.73 kg/m??.    CBC:   Lab Results   Component Value Date/Time    WBC 6.9 08/22/2021 12:36 PM    RBC 4.03 08/22/2021 12:36 PM    HGB 11.7 08/22/2021 12:36 PM    HCT 35.4 08/22/2021 12:36 PM    MCV 87.8 08/22/2021 12:36 PM    RDW 15.0 03/20/2021 08:21 AM    PLT 375 08/22/2021 12:36 PM       CMP:   Lab Results   Component Value Date/Time    NA 137 08/22/2021 12:36 PM    K 4.2 08/22/2021 12:36 PM    K 4.2 07/28/2021 06:10 AM    CL 104 08/22/2021 12:36 PM    CO2 22 08/22/2021 12:36 PM    BUN 22 08/22/2021 12:36 PM  CREATININE 1.2 08/22/2021 12:36 PM    AGRATIO 1.3 03/20/2021 08:21 AM    LABGLOM >60 08/22/2021 12:36 PM    GLUCOSE 188 08/22/2021 12:36 PM    GLUCOSE 123 03/20/2021 08:21 AM    PROT 6.4 07/24/2021 04:12 AM    CALCIUM 9.2 08/22/2021 12:36 PM    BILITOT 0.3 07/24/2021 04:12 AM    ALKPHOS 93 07/24/2021 04:12 AM    AST 15 07/24/2021 04:12 AM    ALT 16 07/24/2021 04:12 AM       POC Tests: No results for input(s): POCGLU, POCNA, POCK, POCCL, POCBUN, POCHEMO, POCHCT in the last 72 hours.    Coags:   Lab Results   Component Value Date/Time    INR 0.96 08/22/2021 12:36 PM    APTT 27.6 08/22/2021 12:36 PM       HCG (If Applicable): No results found for: PREGTESTUR, PREGSERUM, HCG, HCGQUANT     ABGs: No results found for: PHART, PO2ART, PCO2ART, HCO3ART, BEART, O2SATART     Type & Screen (If Applicable):  Lab Results   Component Value Date    LABRH POS 07/26/2021       Drug/Infectious Status (If Applicable):  No results found for: HIV, HEPCAB    COVID-19 Screening (If Applicable):   Lab Results   Component Value Date/Time    COVID19 NOT DETECTED 08/22/2021 02:15 PM           Anesthesia Evaluation    Airway: Mallampati: II  TM distance: >3 FB   Neck ROM: full  Mouth opening: > = 3 FB   Dental:    (+) upper dentures      Pulmonary:   (+) decreased  breath sounds                            Cardiovascular:    (+) hypertension:,         Rhythm: regular                      Neuro/Psych:               GI/Hepatic/Renal:   (+) renal disease:,           Endo/Other:    (+) Diabetes, .                 Abdominal:   (+) obese,           Vascular:          Other Findings:           Anesthesia Plan      general     ASA 4       Induction: intravenous.    MIPS: Postoperative opioids intended and Prophylactic antiemetics administered.  Anesthetic plan and risks discussed with patient and spouse.      Plan discussed with CRNA.                    Elesa Massed, MD   08/22/2021

## 2021-08-22 NOTE — Anesthesia Post-Procedure Evaluation (Signed)
Department of Anesthesiology  Postprocedure Note    Patient: Nilan Iddings  MRN: [id number]  Birthdate: [birth year]  Date of evaluation: 08/22/2021      Procedure Summary     Date: 08/22/21 Room / Location: [facility] OR 01 / [place]    Anesthesia Start: 1646 Anesthesia Stop: 1838    Procedure: I & D AND CLOSURE FOR LEFT CRANIAL WOUND DEHISENCE (Head) Diagnosis:       Wound dehiscence      (Wound dehiscence [T81.30XA])    Surgeons: Darrol Angel, MD Responsible Provider: Elesa Massed, MD    Anesthesia Type: general ASA Status: 4          Anesthesia Type: No value filed.    Aldrete Phase I: Aldrete Score: 9    Aldrete Phase II:        Anesthesia Post Evaluation    Patient location during evaluation: PACU  Patient participation: complete - patient participated  Level of consciousness: awake  Airway patency: patent  Nausea & Vomiting: no vomiting and no nausea  Complications: no  Cardiovascular status: hemodynamically stable  Respiratory status: acceptable and nasal cannula  Hydration status: stable

## 2021-08-22 NOTE — H&P (Signed)
Hospitalist History and Physical          Patient: Marcus Burnett  DOB: 1961/01/02  MRN: [id number]     Acct: [id number]    PCP: [name], MD  Date of Admission: 08/22/2021  Date of Service: Pt seen/examined on 08/22/21  and Admitted to Inpatient with expected LOS greater than two midnights due to medical therapy.             Assessment and Plan:    Cranial Wound dehiscence  I and D and closure per NeuroSx  Monitor for complications related to procedure- infection, bleeding, hematoma  Continue keppra     Type II diabetes  PO and injectable meds held  Inpatient BS goal 140 -180  Start lantus 5 units and titrate to control  Low correction insulin   Hypoglycemia protocol     Primary hypertension  Lisinopril, norvasc    HLD  pravastatin      =======================================================================      Chief Complaint:  wound dehiscence     History Of Present Illness:  Marcus Burnett is a 61 y.o. male with PMHx of  who presents to [facility] with wound dehiscence . Patient states his wife had noticed when helping with dressing change that she could see surgical hardware from his cranial procedure. Patient underwent craniotomy on 2/9 and 2/14 for resection of brain metastases by Dr. Jonne Ply  Patient and wife called Dr. Quentin Mulling office where they were instructed to go the Emergency department. Patient denies fever or chills, pain, chest pain, shortness of breath, nausea or vomiting.    At the time of my assessment patient noted his only pain post-operatively was 2/10        Past Medical History:        Diagnosis Date    Cancer ([place])     Diabetes mellitus ([place])     Hypertension        Past Surgical History:        Procedure Laterality Date    CRANIOTOMY Right 07/26/2021    RIGHT SIDED CRANIOTOMY FOR 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 performed by Illene Labrador, MD at [place] Left 07/31/2021    Left Craniotomy Resection of Tumor performed by Asem Salma, MD  at [place]         Medications Prior to Admission:   Prior to Admission medications    Medication Sig Start Date End Date Taking? Authorizing Provider   levETIRAcetam (KEPPRA) 500 MG tablet Take 1 tablet by mouth 2 times daily 08/04/21 09/03/21  Amy Ardeen Jourdain, DO   famotidine (PEPCID) 20 MG tablet Take 1 tablet by mouth 2 times daily 08/04/21   Amy M Darel Hong, DO   amLODIPine (NORVASC) 10 MG tablet Take 0.5 tablets by mouth daily 08/05/21   Artis Delay, DO   TRULICITY 1.5 QQ/7.6PP SOPN INJECT 0.5 ML (1.'5MG'$  DOSE) INTO THE SKIN EVERY 7 DAYS 11/01/20   Historical Provider, MD   metFORMIN (GLUCOPHAGE) 500 MG tablet TAKE 2 TABLETS (1,000 MG TOTAL) BY MOUTH 2 TIMES DAILY WITH MEALS. 11/06/20   Historical Provider, MD   glipiZIDE (GLUCOTROL XL) 10 MG extended release tablet TAKE 1 TABLET BY MOUTH EVERY DAY 10/10/20   Historical Provider, MD   pioglitazone (ACTOS) 15 MG tablet TAKE 1 TABLET (15 MG TOTAL) BY MOUTH DAILY. 10/09/20   Historical Provider, MD   lisinopril (PRINIVIL;ZESTRIL) 40 MG tablet TAKE 1 TABLET  BY MOUTH EVERY DAY 11/19/20   Historical Provider, MD   pravastatin (PRAVACHOL) 80 MG tablet TAKE 1 TABLET BY MOUTH EVERY DAY 10/29/20   Historical Provider, MD       Allergies:  Patient has no known allergies.    Social History:    The patient currently lives at home.   Tobacco use:   reports that he has never smoked. He has never used smokeless tobacco.  Alcohol use:   reports no history of alcohol use.  Drug use:  reports no history of drug use.     Family History:   as follows:      Problem Relation Age of Onset    Kidney Disease Father     Heart Disease Father     Diabetes Father        Review of Systems:   Pertinent positives and negatives as noted in the HPI. Otherwise complete ROS negative.    Physical Exam:    BP 128/71    Pulse 72    Temp 97.5 ??F (36.4 ??C) (Oral)    Resp 18    Ht '5\' 10"'$  (1.778 m)    Wt 256 lb 4.8 oz (116.3 kg)    SpO2 97%    BMI 36.78 kg/m??       General appearance: No apparent distress,  appears stated age.  Eyes:  Pupils equal, round, and reactive to light. Conjunctivae/corneas clear.  HENT: Head has bandage covering left side of head/skull from surgergy otherwise unremarkable  Neck: Supple, with full range of motion. Trachea midline.  No gross JVD appreciated.  Respiratory:  Normal respiratory effort. Clear to auscultation, bilaterally without rales or wheezes or rhonchi.  Cardiovascular: Normal rate, regular rhythm with normal S1/S2 without murmurs.  No lower extremity edema.   Abdomen: Soft, non-tender, non-distended with normal bowel sounds.  Musculoskeletal: No joint swelling or tenderness. Normal tone. No abnormal movements.   Skin: Warm and dry. No rashes or lesions.  Neurologic:  No focal sensory/motor deficits in the upper and lower extremities. Cranial nerves:  grossly non-focal 2-12.     Psychiatric: Alert and oriented, normal insight and thought content.   Capillary Refill: Brisk,< 3 seconds.  Peripheral Pulses: +2 palpable, equal bilaterally.         Labs:     Recent Labs     08/22/21  1236   WBC 6.9   HGB 11.7*   HCT 35.4*   PLT 375     Recent Labs     08/22/21  1236   NA 137   K 4.2   CL 104   CO2 22*   BUN 22   CREATININE 1.2   CALCIUM 9.2     No results for input(s): AST, ALT, BILIDIR, BILITOT, ALKPHOS in the last 72 hours.  Recent Labs     08/22/21  1236   INR 0.96     No results for input(s): CKTOTAL, TROPONINI in the last 72 hours.  Lab Results   Component Value Date/Time    NITRU NEGATIVE 07/23/2021 06:00 PM    BLOODU NEGATIVE 07/23/2021 06:00 PM    GLUCOSEU NEGATIVE 07/23/2021 06:00 PM         Radiology:     CT HEAD W WO CONTRAST   Final Result       1. Redemonstration of left frontal parietal craniotomy with underlying resection cavity. Postsurgical pneumocephalus at the convexities has resolved but there is persistent fluid and foci of air within the resection  cavity with thickening and enhancement    of the dura overlying the cavity and subjacent to the craniotomy plate. No  definite empyema or cerebritis is identified based on this exam although can't definitely be excluded.   2. Redemonstration of right parietal occipital craniotomy with underlying resection cavity with interval resolution of postsurgical air subjacent to the craniotomy plate.   3. 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               **This report has been created using voice recognition software. It may contain minor errors which are inherent in voice recognition technology.**      Final report electronically signed by Dr. Adrienne Mocha, MD on 08/22/2021 3:47 PM                     Diet: regular post-op  DVT prophylaxis: lovenox   Code Status: Prior  Disposition: admit to 4A     Thank you [name], MD for the opportunity to be involved in this patient's care.    Electronically signed by Georgann Housekeeper, PA on 08/22/2021 at 7:48 PM.

## 2021-08-22 NOTE — Consults (Signed)
NEUROSURGERY  CONSULT NOTE       Requesting Physician: Vangie Bicker, MD     Reason for Consult:  Evaluate for Cranial wound deshiscense    History of Present Illness:  Marcus Burnett is a 61 y.o. male admitted to [facility] on 08/22/2021.    He is a 61 year old male with a past medical history of hypertension, T2DM, renal cell carcinoma s/p nephrectomy, hyperlipidemia, and a lifetime smoker.  He presents to the emergency department for evaluation of wound dehiscence.  Patient underwent craniotomy on 2/9 and 2/14 for resection of brain metastases by Dr. Jonne Ply.  Patient states that while changing the wound dressing yesterday, his wife noted that the wound seemed to come apart and they were able to visualize surgical hardware underneath.  They called Dr. Hedy Camara office and were told to come to the emergency department for evaluation.  Patient denies fever or chills, pain, chest pain, shortness of breath, nausea or vomiting.  They report that they did notice some drainage from the wound but states that it may be related to the bacitracin they have been applying to the wound.    Past Medical History:        Diagnosis Date    Cancer ([place])     Diabetes mellitus ([place])     Hypertension            Procedure Laterality Date    CRANIOTOMY Right 07/26/2021    RIGHT SIDED CRANIOTOMY FOR 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 performed by Illene Labrador, MD at [place] Left 07/31/2021    Left Craniotomy Resection of Tumor performed by Asem Salma, MD at [place] History:  Social History     Tobacco Use   Smoking Status Never   Smokeless Tobacco Never     Social History     Substance and Sexual Activity   Alcohol Use Never     Social History     Substance and Sexual Activity   Drug Use Never     Married    Family History:       Problem Relation Age of Onset    Kidney Disease Father     Heart Disease Father     Diabetes Father        Review of  Systems:  CONSTITUTIONAL:  negative for fever or recent illness  EYE:  No recent visual changes  ENT:  negative for sore throat  RESPIRATORY:  negative for dyspnea  CARDIOVASCULAR:  negative for chest pain  GASTROINTESTINAL:  negative for nausea  HEMATOLOGIC/LYMPHATIC:  negative for unusual bleeding  MUSCULOSKELETAL:    BEHAVIOR/PSYCH:    SKIN: negative for rash  GENITOURINARY: negative for dysuria  NEUROLOGIC: No neurological deficits    Allergies:    No Known Allergies     Current Medications:   No current facility-administered medications for this encounter.       Not in a hospital admission.    Physical Exam:  BP 119/68    Pulse 65    Temp 98.2 ??F (36.8 ??C) (Oral)    Resp 19    Wt 256 lb (116.1 kg)    SpO2 97%    BMI 36.73 kg/m??  I Body mass index is 36.73 kg/m??. I   Wt Readings from Last 1 Encounters:   08/22/21 256 lb (116.1 kg)  GENERAL: he is in no apparent distress, and appears stated age   EYE:  No conjunctival discharge.  Pupils equal.   ENT:  Moist mucosa.  No pharyngeal erythema or exudate.  CARDIOVASCULAR:  Heart regular rate and rhythm.  No significant pretibial edema.  RESPIRATORY:  No apparent respiratory distress.  Lungs clear to auscultation.  ABDOMEN:  Soft and non-tender.  SKIN:  Warm and dry; turgor seems normal.  NEUROLOGIC: No neurological deficits.  Left frontal binder incision open and showing bone fixation plates.  Yellowish crust on the borders of the wound were also seen.  Level of Alertness: alert  Orientation: oriented to person, place and time  Memory and Fund of Knowledge:  normal  Attention/Concentration: normal  Language: no aphasia  Cranial Nerves: pupils are equal; extraocular muscles intact; facial strength and sensation are intact; hearing is intact to soft voice; the palate raises midline, and the tongue protrudes midline; shoulder shrug is symmetric  Motor Exam: Normal tone in all extremities.  Strength is MRC grade 5 in all extremities bilaterally.  Sensory: Sensory  symmetric to light touch  Coordination: Cerebellar function is intact for the nose-finger-nose maneuver, and for rapid alternating movements  Deep Tendon Reflexes: Reflexes are intact and bilaterally symmetric  Plantar Responses:  Downgoing  Abnormal movements: none  Station and gait:  Did not attempt to get patient out of bed    Diagnostics:  CBC:   Lab Results   Component Value Date/Time    WBC 6.9 08/22/2021 12:36 PM    RBC 4.03 08/22/2021 12:36 PM    HGB 11.7 08/22/2021 12:36 PM    HCT 35.4 08/22/2021 12:36 PM    MCV 87.8 08/22/2021 12:36 PM    MCH 29.0 08/22/2021 12:36 PM    MCHC 33.1 08/22/2021 12:36 PM    RDW 15.0 03/20/2021 08:21 AM    PLT 375 08/22/2021 12:36 PM    MPV 9.6 08/22/2021 12:36 PM     CMP:    Lab Results   Component Value Date/Time    NA 137 08/22/2021 12:36 PM    K 4.2 08/22/2021 12:36 PM    K 4.2 07/28/2021 06:10 AM    CL 104 08/22/2021 12:36 PM    CO2 22 08/22/2021 12:36 PM    BUN 22 08/22/2021 12:36 PM    CREATININE 1.2 08/22/2021 12:36 PM    AGRATIO 1.3 03/20/2021 08:21 AM    LABGLOM >60 08/22/2021 12:36 PM    GLUCOSE 188 08/22/2021 12:36 PM    GLUCOSE 123 03/20/2021 08:21 AM    PROT 6.4 07/24/2021 04:12 AM    LABALBU 3.9 07/24/2021 04:12 AM    CALCIUM 9.2 08/22/2021 12:36 PM    BILITOT 0.3 07/24/2021 04:12 AM    ALKPHOS 93 07/24/2021 04:12 AM    AST 15 07/24/2021 04:12 AM    ALT 16 07/24/2021 04:12 AM     PT/INR:  No results found for: PROTIME, INR        Impression:  Patient with above history was evaluated.  After careful evaluation of imaging and physical elements the patient exhibits a left frontal barnyard wound open with bone metal fixation exposed.  Yellowish crust around the wound edges were seen but no active exudates or collections were seen.  Patient will explain that this need to be addressed as soon as possible to prevent the possibility of brain infection.  Arrangements have been made with the OR for cleansing and debridement today in the late afternoon.  Head CT with  and  without contrast was also ordered to rule out any infectious collections underneath the wound.  Wound will be cultured Intra-Op to rule out any infectious process.  Patient wife and patient were oriented of the plan and note questions were present at the closure of the interview.    Recommendations :  1.  N.p.o.  2.  Presurgical lab work-up.    3.  On-call to the OR today at the earliest availability.  Cleansing and debridement of wound and flap relaxation to close the wound appropriately.  Also Intra-Op wound culture will also be ordered.    4.  Head CT with and without contrast to assess for any infectious process.    5.  Admission to ICU.      I appreciate the opportunity to participate in this pleasant patient's care.    Electronically signed by Darrol Angel, MD on 08/22/2021 at 1:49 PM

## 2021-08-22 NOTE — Progress Notes (Signed)
1835: pt arrives to pacu. Pt on room air and placed on 3L nasal cannula. VSS. Respirations unlabored. Pt denies pain. Pt responds to verbal stimulation.   1846: pt sitting up in bed and eating ice chips   1850: DR. At bedside talking to pt   1905: pt meets discharge criteria from pacu   1908: report called to [facility]: transport arrives. Pt transported to [facility]

## 2021-08-22 NOTE — ED Provider Notes (Addendum)
[facility]      EMERGENCY MEDICINE     Pt Name: Marcus Burnett  MRN: [id number]  [place] 05-Sep-1960  Date of evaluation: 08/22/2021  Provider: Vangie Bicker, MD    CHIEF COMPLAINT       Chief Complaint   Patient presents with    Wound Dehiscence     HISTORY OF PRESENT ILLNESS   Marcus Burnett is a pleasant 61 y.o. male who presents to the emergency department for evaluation of wound dehiscence.  Patient underwent craniotomy on 2/9 and 2/14 for resection of brain metastases Dr. Jonne Ply.  Patient states that while changing the wound dressing yesterday, his wife noted that the wound seemed to come apart and they were able to visualize surgical hardware underneath.  They called Dr. Hedy Camara office and were told to come to the emergency department for evaluation.  Patient denies fever or chills, pain, chest pain, shortness of breath, nausea or vomiting.  They report that they did notice some drainage from the wound but states that it may be related to the bacitracin they have been applying to the wound.    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 performed by Illene Labrador, MD at [facility] Left 07/31/2021    Left Craniotomy Resection of Tumor performed by Illene Labrador, MD at [facility]       Current Discharge Medication List        CONTINUE  these medications which have NOT CHANGED    Details   levETIRAcetam (KEPPRA) 500 MG tablet Take 1 tablet by mouth 2 times daily  Qty: 60 tablet, Refills: 0      famotidine (PEPCID) 20 MG tablet Take 1 tablet by mouth 2 times daily  Qty: 60 tablet, Refills: 3      amLODIPine (NORVASC) 10 MG tablet Take 0.5 tablets by mouth daily  Qty: 30 tablet, Refills: 3      TRULICITY 1.5 WU/9.8JX SOPN INJECT 0.5 ML (1.'5MG'$  DOSE) INTO THE SKIN EVERY 7 DAYS      metFORMIN (GLUCOPHAGE) 500 MG tablet TAKE 2 TABLETS (1,000 MG TOTAL) BY MOUTH 2 TIMES DAILY WITH MEALS.      glipiZIDE (GLUCOTROL XL) 10 MG extended release tablet TAKE 1 TABLET BY MOUTH EVERY DAY      pioglitazone (ACTOS) 15 MG tablet TAKE 1 TABLET (15 MG TOTAL) BY MOUTH DAILY.      lisinopril (PRINIVIL;ZESTRIL) 40 MG tablet TAKE 1 TABLET BY MOUTH EVERY DAY      pravastatin (PRAVACHOL) 80 MG tablet TAKE 1 TABLET BY MOUTH EVERY DAY  ALLERGIES     has No Known Allergies.    FAMILY HISTORY     He indicated that his mother is alive. He indicated that his father is deceased.       SOCIAL HISTORY       Social History     Tobacco Use    Smoking status: Never    Smokeless tobacco: Never   Vaping Use    Vaping Use: Never used   Substance Use Topics    Alcohol use: Never    Drug use: Never       PHYSICAL EXAM       ED Triage Vitals   BP Temp Temp Source Heart Rate Resp SpO2 Height Weight   08/22/21 1113 08/22/21 1113 08/22/21 1113 08/22/21 1113 08/22/21 1113 08/22/21 1113 -- 08/22/21 1124   (!) 143/77 98.2 ??F (36.8 ??C) Oral 72 20 98 %  256 lb (116.1 kg)       Additional Vital Signs:  Vitals:    08/23/21 0300   BP: (!) 108/56   Pulse: 66   Resp: 18   Temp: 97.5 ??F (36.4 ??C)   SpO2: 96%     Physical Exam  Vitals and nursing note reviewed.   Constitutional:       General: He is not in acute distress.     Appearance: Normal appearance.   HENT:      Head: Normocephalic and atraumatic.      Comments: Left parietal surgical incision with two areas of dehiscence. Surgical hardware  visible at base of wound. No significant erythema or purulence to the wound      Mouth/Throat:      Mouth: Mucous membranes are moist.   Eyes:      Extraocular Movements: Extraocular movements intact.      Conjunctiva/sclera: Conjunctivae normal.      Pupils: Pupils are equal, round, and reactive to light.   Cardiovascular:      Rate and Rhythm: Normal rate and regular rhythm.      Pulses: Normal pulses.      Heart sounds: Normal heart sounds.   Pulmonary:      Effort: Pulmonary effort is normal.      Breath sounds: Normal breath sounds.   Abdominal:      General: Abdomen is flat.      Palpations: Abdomen is soft.      Tenderness: There is no abdominal tenderness.   Musculoskeletal:         General: Normal range of motion.      Cervical back: Normal range of motion and neck supple.   Skin:     General: Skin is warm and dry.      Capillary Refill: Capillary refill takes less than 2 seconds.   Neurological:      General: No focal deficit present.      Mental Status: He is alert and oriented to person, place, and time.   Psychiatric:         Mood and Affect: Mood normal.         Behavior: Behavior normal.       FORMAL DIAGNOSTIC RESULTS     RADIOLOGY: Interpretation per the Radiologist below, if available at the time of this note (none if blank):    CT HEAD W WO CONTRAST   Final Result       1. Redemonstration of left frontal parietal craniotomy with underlying resection cavity. Postsurgical pneumocephalus at the convexities has resolved but there is persistent  fluid and foci of air within the resection cavity with thickening and enhancement    of the dura overlying the cavity and subjacent to the craniotomy plate. No definite empyema or cerebritis is identified based on this exam although can't definitely be excluded.   2. Redemonstration of right parietal occipital craniotomy with underlying resection cavity with interval resolution of postsurgical air subjacent to the craniotomy plate.   3. 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               **This report has been created using voice recognition software. It may contain minor errors which are inherent in voice recognition technology.**      Final report electronically signed by Dr. Adrienne Mocha, MD on 08/22/2021 3:47 PM          LABS: (none if blank)  Labs Reviewed   CBC WITH AUTO DIFFERENTIAL - Abnormal; Notable for the following components:       Result Value    RBC 4.03 (*)     Hemoglobin 11.7 (*)     Hematocrit 35.4 (*)     All other components within normal limits   BASIC METABOLIC PANEL - Abnormal; Notable for the following components:    CO2 22 (*)     Glucose 188 (*)     All other components within normal limits   POCT GLUCOSE - Abnormal; Notable for the following components:    POC Glucose 121 (*)     All other components within normal limits   COVID-19 & INFLUENZA COMBO   CULTURE, ANAEROBIC AND AEROBIC    Narrative:     Source: head       Site: swab left cranial          Current Antibiotics: not stated   CULTURE, ANAEROBIC AND AEROBIC    Narrative:     Source: head       Site: swab left cranial          Current Antibiotics: not stated   GLOMERULAR FILTRATION RATE, ESTIMATED   OSMOLALITY   ANION GAP   PROTIME-INR   APTT   SURGICAL PATHOLOGY   SURGICAL PATHOLOGY   POCT GLUCOSE   POCT GLUCOSE       (Any cultures that may have been sent were not resulted at the time of this patient visit)    [place] / ED COURSE:     1) Number and Complexity of Problems            Problem List This Visit:         Chief Complaint   Patient presents with    Wound Dehiscence            Differential Diagnosis includes (but not limited to):  Wound dehiscence, infection, abscess, cellulitis              Pertinent Comorbid Conditions:    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    3)  Treatment and Disposition         ED Reassessment:  No change in clinical status, aware for plan for neurosurgery intervention         Case discussed with consulting clinician:  Neurosurgery         Shared Decision-Making was performed and disposition discussed with the        Patient/Family and questions answered          Code Status:  Limited      Summary of Patient Presentation:      MDM  Number of Diagnoses or Management Options  History of craniotomy  Wound dehiscence  Diagnosis management comments: Marcus Burnett is a pleasant 61 y.o. male who presents to the emergency department for evaluation of wound dehiscence.  Patient is hemodynamically stable and in no distress.  No systemic signs of illness at this time.  Basic labs unremarkable.  Neurosurgery was consulted and evaluated patient in the emergency department with plan to take to the OR for I&D and washout.  Additional labs and imaging obtained per neurosurgery request.  Patient transferred to the OR in stable condition.    /   Vitals Reviewed:    Vitals:    08/22/21 2200 08/22/21 2300 08/22/21 2309 08/23/21 0300   BP: (!) 100/58 (!) 111/53 (!) 111/53 (!) 108/56   Pulse: 74 69 72 66   Resp:  18  18   Temp:  97.5 ??F (36.4 ??C) 97.5 ??F (36.4 ??C) 97.5 ??F (36.4 ??C)   TempSrc:  Oral  Oral   SpO2:  96% 98% 96%   Weight:       Height:           The patient was seen and examined. Appropriate diagnostic testing was performed and results reviewed with the patient.      The results of pertinent diagnostic studies and exam findings were discussed. The patient???s provisional diagnosis and plan of care were discussed with the patient and present family who expressed understanding.     ED Medications administered  this visit:  (None if blank)  Medications   sodium chloride flush 0.9 % injection 10 mL (10 mLs IntraVENous Not Given 08/22/21 2100)   sodium chloride flush 0.9 % injection 10 mL (has no administration in time range)   0.9 % sodium chloride infusion (has no administration in time range)   ondansetron (ZOFRAN-ODT) disintegrating tablet 4 mg (has no administration in time range)     Or   ondansetron (ZOFRAN) injection 4 mg (has no administration in time range)   polyethylene glycol (GLYCOLAX) packet 17 g (has no administration in time range)   acetaminophen (TYLENOL) tablet 650 mg (650 mg Oral Given 08/22/21 2317)     Or   acetaminophen (TYLENOL) suppository 650 mg ( Rectal See Alternative 08/22/21 2317)   enoxaparin Sodium (LOVENOX) injection 30 mg (30 mg SubCUTAneous Given 08/22/21 2058)   amLODIPine (NORVASC) tablet 5 mg (has no administration in time range)   famotidine (PEPCID) tablet 20 mg (20 mg Oral Given 08/22/21 2058)   levETIRAcetam (KEPPRA) tablet 500 mg (500 mg Oral Given 08/22/21 2059)   lisinopril (PRINIVIL;ZESTRIL) tablet 40 mg (40 mg Oral Given 08/22/21 2059)   pravastatin (PRAVACHOL) tablet 80 mg (80 mg Oral Given 08/22/21 2059)   insulin  lispro (HUMALOG) injection vial 0-4 Units (has no administration in time range)   insulin lispro (HUMALOG) injection vial 0-4 Units (0 Units SubCUTAneous Not Given 08/22/21 2101)   insulin glargine (LANTUS) injection vial 5 Units (5 Units SubCUTAneous Given 08/22/21 2102)   glucose chewable tablet 16 g (has no administration in time range)   dextrose bolus 10% 125 mL (has no administration in time range)     Or   dextrose bolus 10% 250 mL (has no administration in time range)   glucagon (rDNA) injection 1 mg (has no administration in time range)   dextrose 10 % infusion (has no administration in time range)   iopamidol (ISOVUE-370) 76 % injection 75 mL (75 mLs IntraVENous Given 08/22/21 1458)         PROCEDURES: (None if blank)  Procedures:     CRITICAL CARE: (None if  blank)      DISCHARGE PRESCRIPTIONS: (None if blank)  Current Discharge Medication List          FINAL IMPRESSION      1. Wound dehiscence    2. History of craniotomy          DISPOSITION/PLAN   DISPOSITION Decision To Admit 08/23/2021 03:33:13 AM      OUTPATIENT FOLLOW UP THE PATIENT:  No follow-up provider specified.    Vangie Bicker, MD       Vangie Bicker, MD  08/22/21 [place], MD  08/23/21 [phone]

## 2021-08-22 NOTE — Brief Op Note (Signed)
Brief Postoperative Note      Patient: Marcus Burnett  Date of Birth: 13-Jan-1961  MRN: [id number]    Date of Procedure: 08/22/2021    Pre-Op Diagnosis: Wound dehiscence [T81.30XA]    Post-Op Diagnosis: Same       Procedure(s):  I & D AND CLOSURE FOR LEFT CRANIAL WOUND DEHISENCE    Surgeon(s):  Darrol Angel, MD    Assistant:  First Assistant: Darolyn Rua    Anesthesia: General    Estimated Blood Loss (mL): Minimal    Complications: None    Specimens:   ID Type Source Tests Collected by Time Destination   A : Left cranial swab #1  Swab Head SURGICAL PATHOLOGY, CULTURE, ANAEROBIC AND AEROBIC Darrol Angel, MD 08/22/2021 1708    B : Left cranial wound #2 Swab Head SURGICAL PATHOLOGY, CULTURE, ANAEROBIC AND AEROBIC Darrol Angel, MD 08/22/2021 1715        Implants:  * No implants in log *      Drains:   [REMOVED] Closed/Suction Drain Superior Bulb (Removed)   Site Description Clean, dry & intact 07/30/21 0800   Dressing Status Clean, dry & intact 07/30/21 0800   Drainage Appearance Serosanguinous 07/30/21 0800   Drain Status To bulb suction 07/30/21 0800   Output (ml) 10 ml 07/29/21 2324       [REMOVED] Closed/Suction Drain Left Scalp Bulb (Removed)   Site Description Unable to view 08/03/21 1644   Dressing Status Clean, dry & intact 08/03/21 1644   Drainage Appearance Serosanguinous 08/03/21 1644   Drain Status To bulb suction 08/03/21 1644   Output (ml) 20 ml 08/03/21 0607       [REMOVED] Urinary Catheter 07/26/21 Foley-Temperature (Removed)   Catheter Indications Perioperative use for selected surgical procedures 07/27/21 0745   Site Assessment No urethral drainage 07/27/21 0745   Urine Color Yellow 07/27/21 0745   Urine Appearance Clear 07/27/21 0745   Collection Container Standard 07/27/21 0745   Securement Method Securing device (Describe) 07/27/21 0745   Catheter Care  Soap and water 07/26/21 1630   Catheter Best Practices  Drainage tube clipped to bed;Catheter secured to thigh;Tamper seal  intact;Bag below bladder;Bag not on floor;Lack of dependent loop in tubing;Drainage bag less than half full 07/27/21 0745   Status Draining 07/27/21 0745   Output (mL) 300 mL 07/27/21 0745       [REMOVED] Urinary Catheter 07/31/21 2 Way (Removed)       Findings: Wound had yellowish crust on a barn yard incision on the left frontal area.  When the wound was opened no clear evidence of pus or any indication of infectious process.  Granular tissue was seen on the borders as well.    Electronically signed by Darrol Angel, MD on 08/22/2021 at 6:40 PM

## 2021-08-22 NOTE — Op Note (Signed)
Operative Note      Patient: Marcus Burnett  Date of Birth: 1960/11/10  MRN: [id number]    Date of Procedure: 08/22/2021    Pre-Op Diagnosis: Wound dehiscence [T81.30XA]    Post-Op Diagnosis: Same       Procedure(s):  I & D AND CLOSURE FOR LEFT CRANIAL WOUND DEHISENCE    Surgeon(s):  Darrol Angel, MD    Assistant:   First Assistant: Darolyn Rua    Anesthesia: General    Estimated Blood Loss (mL): Minimal    Complications: None    Specimens:   ID Type Source Tests Collected by Time Destination   A : Left cranial swab #1  Swab Head SURGICAL PATHOLOGY, CULTURE, ANAEROBIC AND AEROBIC Darrol Angel, MD 08/22/2021 1708    B : Left cranial wound #2 Swab Head SURGICAL PATHOLOGY, CULTURE, ANAEROBIC AND AEROBIC Darrol Angel, MD 08/22/2021 1715        Implants:  * No implants in log *      Drains:   [REMOVED] Closed/Suction Drain Superior Bulb (Removed)   Site Description Clean, dry & intact 07/30/21 0800   Dressing Status Clean, dry & intact 07/30/21 0800   Drainage Appearance Serosanguinous 07/30/21 0800   Drain Status To bulb suction 07/30/21 0800   Output (ml) 10 ml 07/29/21 2324       [REMOVED] Closed/Suction Drain Left Scalp Bulb (Removed)   Site Description Unable to view 08/03/21 1644   Dressing Status Clean, dry & intact 08/03/21 1644   Drainage Appearance Serosanguinous 08/03/21 1644   Drain Status To bulb suction 08/03/21 1644   Output (ml) 20 ml 08/03/21 0607       [REMOVED] Urinary Catheter 07/26/21 Foley-Temperature (Removed)   Catheter Indications Perioperative use for selected surgical procedures 07/27/21 0745   Site Assessment No urethral drainage 07/27/21 0745   Urine Color Yellow 07/27/21 0745   Urine Appearance Clear 07/27/21 0745   Collection Container Standard 07/27/21 0745   Securement Method Securing device (Describe) 07/27/21 0745   Catheter Care  Soap and water 07/26/21 1630   Catheter Best Practices  Drainage tube clipped to bed;Catheter secured to thigh;Tamper seal intact;Bag  below bladder;Bag not on floor;Lack of dependent loop in tubing;Drainage bag less than half full 07/27/21 0745   Status Draining 07/27/21 0745   Output (mL) 300 mL 07/27/21 0745       [REMOVED] Urinary Catheter 07/31/21 2 Way (Removed)       Findings: Left frontal barnyard incision with yellow crust on the borders.  When the wound was open no clear evidence of infectious process (no pus or any other infectious product).  Granular tissue was seen on the borders of the wound and inside the wound as well.    Detailed Description of Procedure:   Wound culture was done before washing in the usual sterile manner with Betadine scrub and paint.  Wound was open and all sutures were removed.  The cavities were checked for any collection or infectious signs and none could be identified at that time.  The wound was cleaned with saline water and hydrogen peroxide.  Borders of the wound were refresh with 10 blade until no granular tissue was seen.  Undermining of the wound was done so that the skin could approximate without any tension.  Vicryl 2 oh's were used to approximate the galea.  Prolene 3 0 is were used in vertical mattress fashion and the wound was approximated without any gaps.  Bacitracin  was applied and wound was covered with 4 x 4's and Kerlix.  No complications were seen.  The patient was evaluated in the postop area oriented to time person place and is going to the intermediate care unit.    Electronically signed by Darrol Angel, MD on 08/22/2021 at 7:40 PM

## 2021-08-23 LAB — BASIC METABOLIC PANEL W/ REFLEX TO MG FOR LOW K
BUN: 15 mg/dL (ref 7–22)
CO2: 21 meq/L — ABNORMAL LOW (ref 23–33)
Calcium: 8.9 mg/dL (ref 8.5–10.5)
Chloride: 105 meq/L (ref 98–111)
Creatinine: 1.2 mg/dL (ref 0.4–1.2)
Glucose: 143 mg/dL — ABNORMAL HIGH (ref 70–108)
Potassium reflex Magnesium: 4.5 meq/L (ref 3.5–5.2)
Sodium: 139 meq/L (ref 135–145)

## 2021-08-23 LAB — CBC
Hematocrit: 34.5 % — ABNORMAL LOW (ref 42.0–52.0)
Hemoglobin: 11.2 gm/dl — ABNORMAL LOW (ref 14.0–18.0)
MCH: 28.9 pg (ref 26.0–33.0)
MCHC: 32.5 gm/dl (ref 32.2–35.5)
MCV: 89.1 fL (ref 80.0–94.0)
MPV: 9.6 fL (ref 9.4–12.4)
Platelets: 311 10*3/uL (ref 130–400)
RBC: 3.87 10*6/uL — ABNORMAL LOW (ref 4.70–6.10)
RDW-CV: 14 % (ref 11.5–14.5)
RDW-SD: 45 fL (ref 35.0–45.0)
WBC: 7.3 10*3/uL (ref 4.8–10.8)

## 2021-08-23 LAB — POCT GLUCOSE
POC Glucose: 121 mg/dl — ABNORMAL HIGH (ref 70–108)
POC Glucose: 127 mg/dl — ABNORMAL HIGH (ref 70–108)
POC Glucose: 141 mg/dl — ABNORMAL HIGH (ref 70–108)
POC Glucose: 206 mg/dl — ABNORMAL HIGH (ref 70–108)

## 2021-08-23 LAB — ANION GAP: Anion Gap: 13 meq/L (ref 8.0–16.0)

## 2021-08-23 LAB — GLOMERULAR FILTRATION RATE, ESTIMATED: Est, Glom Filt Rate: >60 mL/min/{1.73_m2} (ref >60)

## 2021-08-23 MED ORDER — DEXTROSE 10 % IV BOLUS
INTRAVENOUS | Status: DC | PRN
Start: 2021-08-23 — End: 2021-08-24

## 2021-08-23 MED ORDER — ENOXAPARIN SODIUM 30 MG/0.3ML IJ SOSY
30 MG/0.3ML | Freq: Two times a day (BID) | INTRAMUSCULAR | Status: DC
Start: 2021-08-23 — End: 2021-08-24
  Administered 2021-08-23 – 2021-08-24 (×4): 30 mg via SUBCUTANEOUS

## 2021-08-23 MED ORDER — PRAVASTATIN SODIUM 80 MG PO TABS
80 MG | Freq: Every day | ORAL | Status: DC
Start: 2021-08-23 — End: 2021-08-24
  Administered 2021-08-23 – 2021-08-24 (×2): 80 mg via ORAL

## 2021-08-23 MED ORDER — POLYETHYLENE GLYCOL 3350 17 G PO PACK
17 g | Freq: Every day | ORAL | Status: DC | PRN
Start: 2021-08-23 — End: 2021-08-24

## 2021-08-23 MED ORDER — NORMAL SALINE FLUSH 0.9 % IV SOLN
0.9 % | Freq: Two times a day (BID) | INTRAVENOUS | Status: DC
Start: 2021-08-23 — End: 2021-08-24
  Administered 2021-08-23: 14:00:00 10 mL via INTRAVENOUS

## 2021-08-23 MED ORDER — GLUCAGON HCL RDNA (DIAGNOSTIC) 1 MG IJ SOLR
1 MG | INTRAMUSCULAR | Status: DC | PRN
Start: 2021-08-23 — End: 2021-08-24

## 2021-08-23 MED ORDER — ACETAMINOPHEN 325 MG PO TABS
325 MG | Freq: Four times a day (QID) | ORAL | Status: DC | PRN
Start: 2021-08-23 — End: 2021-08-24
  Administered 2021-08-23 (×2): 650 mg via ORAL

## 2021-08-23 MED ORDER — LISINOPRIL 40 MG PO TABS
40 MG | Freq: Every day | ORAL | Status: DC
Start: 2021-08-23 — End: 2021-08-24
  Administered 2021-08-23 – 2021-08-24 (×3): 40 mg via ORAL

## 2021-08-23 MED ORDER — AMLODIPINE BESYLATE 5 MG PO TABS
5 MG | Freq: Every day | ORAL | Status: DC
Start: 2021-08-23 — End: 2021-08-24
  Administered 2021-08-23: 14:00:00 5 mg via ORAL

## 2021-08-23 MED ORDER — ONDANSETRON 4 MG PO TBDP
4 MG | Freq: Three times a day (TID) | ORAL | Status: DC | PRN
Start: 2021-08-23 — End: 2021-08-24

## 2021-08-23 MED ORDER — ONDANSETRON HCL 4 MG/2ML IJ SOLN
4 MG/2ML | Freq: Four times a day (QID) | INTRAMUSCULAR | Status: DC | PRN
Start: 2021-08-23 — End: 2021-08-24

## 2021-08-23 MED ORDER — SODIUM CHLORIDE 0.9 % IV SOLN
0.9 % | INTRAVENOUS | Status: DC | PRN
Start: 2021-08-23 — End: 2021-08-24

## 2021-08-23 MED ORDER — ACETAMINOPHEN 650 MG RE SUPP
650 MG | Freq: Four times a day (QID) | RECTAL | Status: DC | PRN
Start: 2021-08-23 — End: 2021-08-24

## 2021-08-23 MED ORDER — AMINOGLYCOSIDE INTERMITTENT DOSING (PLACEHOLDER)
Status: DC
Start: 2021-08-23 — End: 2021-08-23

## 2021-08-23 MED ORDER — FAMOTIDINE 20 MG PO TABS
20 MG | Freq: Two times a day (BID) | ORAL | Status: DC
Start: 2021-08-23 — End: 2021-08-24
  Administered 2021-08-23 – 2021-08-24 (×4): 20 mg via ORAL

## 2021-08-23 MED ORDER — LEVETIRACETAM 500 MG PO TABS
500 MG | Freq: Two times a day (BID) | ORAL | Status: DC
Start: 2021-08-23 — End: 2021-08-24
  Administered 2021-08-23 – 2021-08-24 (×4): 500 mg via ORAL

## 2021-08-23 MED ORDER — INSULIN GLARGINE 100 UNIT/ML SC SOLN
100 UNIT/ML | Freq: Every evening | SUBCUTANEOUS | Status: DC
Start: 2021-08-23 — End: 2021-08-24
  Administered 2021-08-23: 02:00:00 5 [IU] via SUBCUTANEOUS

## 2021-08-23 MED ORDER — DEXTROSE 10 % IV SOLN
10 % | INTRAVENOUS | Status: DC | PRN
Start: 2021-08-23 — End: 2021-08-24

## 2021-08-23 MED ORDER — INSULIN LISPRO 100 UNIT/ML IJ SOLN
100 UNIT/ML | Freq: Three times a day (TID) | INTRAMUSCULAR | Status: DC
Start: 2021-08-23 — End: 2021-08-24
  Administered 2021-08-23: 23:00:00 1 [IU] via SUBCUTANEOUS

## 2021-08-23 MED ORDER — CEFAZOLIN SODIUM 1 G IJ SOLR
1 g | Freq: Three times a day (TID) | INTRAMUSCULAR | Status: DC
Start: 2021-08-23 — End: 2021-08-23
  Administered 2021-08-23 (×2): 1000 mg via INTRAVENOUS

## 2021-08-23 MED ORDER — INSULIN LISPRO 100 UNIT/ML IJ SOLN
100 UNIT/ML | Freq: Every evening | INTRAMUSCULAR | Status: DC
Start: 2021-08-23 — End: 2021-08-24

## 2021-08-23 MED ORDER — GENTAMICIN SULFATE 40 MG/ML IJ SOLN
40 MG/ML | INTRAMUSCULAR | Status: DC
Start: 2021-08-23 — End: 2021-08-23
  Administered 2021-08-23: 19:00:00 451.6 mg/kg via INTRAVENOUS

## 2021-08-23 MED ORDER — SODIUM CHLORIDE 0.9 % IV SOLN
0.9 % | INTRAVENOUS | Status: AC
Start: 2021-08-23 — End: 2021-08-24
  Administered 2021-08-23: 19:00:00 via INTRAVENOUS

## 2021-08-23 MED ORDER — GLUCOSE 4 G PO CHEW
4 g | ORAL | Status: DC | PRN
Start: 2021-08-23 — End: 2021-08-24

## 2021-08-23 MED ORDER — NORMAL SALINE FLUSH 0.9 % IV SOLN
0.9 % | INTRAVENOUS | Status: DC | PRN
Start: 2021-08-23 — End: 2021-08-24

## 2021-08-23 MED FILL — LEVETIRACETAM 500 MG PO TABS: 500 MG | ORAL | Qty: 1

## 2021-08-23 MED FILL — CEFAZOLIN SODIUM 1 G IJ SOLR: 1 g | INTRAMUSCULAR | Qty: 1000

## 2021-08-23 MED FILL — ACETAMINOPHEN 325 MG PO TABS: 325 MG | ORAL | Qty: 2

## 2021-08-23 MED FILL — ENOXAPARIN SODIUM 30 MG/0.3ML IJ SOSY: 30 MG/0.3ML | INTRAMUSCULAR | Qty: 0.3

## 2021-08-23 MED FILL — LISINOPRIL 40 MG PO TABS: 40 MG | ORAL | Qty: 1

## 2021-08-23 MED FILL — PRAVASTATIN SODIUM 80 MG PO TABS: 80 MG | ORAL | Qty: 1

## 2021-08-23 MED FILL — AMLODIPINE BESYLATE 5 MG PO TABS: 5 MG | ORAL | Qty: 1

## 2021-08-23 MED FILL — FAMOTIDINE 20 MG PO TABS: 20 MG | ORAL | Qty: 1

## 2021-08-23 MED FILL — HUMALOG 100 UNIT/ML IJ SOLN: 100 UNIT/ML | INTRAMUSCULAR | Qty: 1

## 2021-08-23 MED FILL — LANTUS 100 UNIT/ML SC SOLN: 100 UNIT/ML | SUBCUTANEOUS | Qty: 5

## 2021-08-23 MED FILL — GENTAMICIN SULFATE 40 MG/ML IJ SOLN: 40 MG/ML | INTRAMUSCULAR | Qty: 11.29

## 2021-08-23 NOTE — Plan of Care (Signed)
Problem: Discharge Planning  Goal: Discharge to home or other facility with appropriate resources  08/23/2021 1127 by Buren Kos, RN  Outcome: Progressing     Problem: Safety - Adult  Goal: Free from fall injury  08/23/2021 1127 by Buren Kos, RN  Outcome: Progressing  Flowsheets (Taken 08/23/2021 1127)  Free From Fall Injury: Instruct family/caregiver on patient safety  Note: Patient remains free from falls this shift. Fall precautions in place with bed/chair exit alarmed. Fall sign posted and fall armband in place. Nonskid footwear used with transferring. Educated patient to use call light when in need of staff assistance with transferring, ambulating, and other activities of daily living. Patient appropriately uses call light this shift.        Problem: Skin/Tissue Integrity  Goal: Absence of new skin breakdown  Description: 1.  Monitor for areas of redness and/or skin breakdown  2.  Assess vascular access sites hourly  3.  Every 4-6 hours minimum:  Change oxygen saturation probe site  4.  Every 4-6 hours:  If on nasal continuous positive airway pressure, respiratory therapy assess nares and determine need for appliance change or resting period.  08/23/2021 1127 by Buren Kos, RN  Outcome: Progressing  Note: Patient exhibits no new skin breakdown this shift. Patient repositioned Q2H and as needed with staff assistance. All skin integrity issuse charted in Flowsheets. Will continue to monitor.       Problem: Pain  Goal: Verbalizes/displays adequate comfort level or baseline comfort level  08/23/2021 1127 by Buren Kos, RN  Outcome: Progressing  Flowsheets (Taken 08/23/2021 1127)  Verbalizes/displays adequate comfort level or baseline comfort level:   Encourage patient to monitor pain and request assistance   Assess pain using appropriate pain scale     Problem: Chronic Conditions and Co-morbidities  Goal: Patient's chronic conditions and co-morbidity symptoms are monitored and maintained or improved  Outcome:  Progressing  Flowsheets (Taken 08/23/2021 1127)  Care Plan - Patient's Chronic Conditions and Co-Morbidity Symptoms are Monitored and Maintained or Improved: Monitor and assess patient's chronic conditions and comorbid symptoms for stability, deterioration, or improvement     Problem: Neurosensory - Adult  Goal: Achieves stable or improved neurological status  Outcome: Progressing  Flowsheets (Taken 08/23/2021 1127)  Achieves stable or improved neurological status: Assess for and report changes in neurological status     Problem: Neurosensory - Adult  Goal: Absence of seizures  Outcome: Progressing  Flowsheets (Taken 08/23/2021 1127)  Absence of seizures: Monitor for seizure activity.  If seizure occurs, document type and location of movements and any associated apnea     Problem: Neurosensory - Adult  Goal: Remains free of injury related to seizures activity  Outcome: Progressing  Flowsheets (Taken 08/23/2021 1127)  Remains free of injury related to seizure activity: Maintain airway, patient safety  and administer oxygen as ordered     Problem: Neurosensory - Adult  Goal: Achieves maximal functionality and self care  Outcome: Progressing  Flowsheets (Taken 08/23/2021 1127)  Achieves maximal functionality and self care: Monitor swallowing and airway patency with patient fatigue and changes in neurological status     Problem: Cardiovascular - Adult  Goal: Maintains optimal cardiac output and hemodynamic stability  Outcome: Progressing  Flowsheets (Taken 08/23/2021 1127)  Maintains optimal cardiac output and hemodynamic stability:   Monitor blood pressure and heart rate   Assess for signs of decreased cardiac output  Note:   Vitals:    08/22/21 2300 08/22/21 2309 08/23/21 0300 08/23/21 0900   BP: Marland Kitchen)  111/53 (!) 111/53 (!) 108/56 109/65   Pulse: 69 72 66 66   Resp: '18  18 18   '$ Temp: 97.5 ??F (36.4 ??C) 97.5 ??F (36.4 ??C) 97.5 ??F (36.4 ??C) 97.7 ??F (36.5 ??C)   TempSrc: Oral  Oral Oral   SpO2: 96% 98% 96% 94%   Weight:       Height:              Problem: Cardiovascular - Adult  Goal: Absence of cardiac dysrhythmias or at baseline  Outcome: Progressing  Flowsheets (Taken 08/23/2021 1127)  Absence of cardiac dysrhythmias or at baseline:   Monitor cardiac rate and rhythm   Assess for signs of decreased cardiac output  Note: Patient in NSR on telemetry.   Care plan reviewed with patient and family.  Patient and family verbalize understanding of the plan of care and contribute to goal setting.

## 2021-08-23 NOTE — Progress Notes (Signed)
Hospitalist progress note          Patient: Marcus Burnett  DOB: 06/21/1960  MRN: [id number]     Acct: [id number]    PCP: [name], MD  Date of Admission: 08/22/2021  Date of Service: Pt seen/examined on 08/23/21          Assessment and Plan:    Cranial Wound dehiscence from recent craniotomy.  I and D and closure per NeuroSx  Patient has been started prophylactic antibiotics and ID has been consulted.  Will start 50 cc/h for 12 hours.    Metastatic renal cell carcinoma.  Patient is being evaluated by oncology and radiation oncology outpatient.  As of now he is not interested in treatment.  S/p nephrectomy.    S/p metastatic mass resection and craniotomy on 07/31/2021.  By Dr. Jonne Ply.    Non-insulin-dependent type II diabetes  Most recent HbA1c 6.9.  At home on metformin.  Inpatient BS goal 140 -180, currently blood sugars within normal limit.,  Continue 5 unit of Lantus at night.  Low-dose sliding scale and hypoglycemia protocol.  Outpatient follow-up with PCP.    Primary hypertension, at home on lisinopril and Norvasc.  Blood pressure soft.  Will hold amlodipine.  Will likely discontinue at discharge.    Normocytic anemia.  Likely anemia of chronic disease in the setting of malignancy.      As per chart review I found that he has some mild foci of lesion in his spine and an lung likely metastatic.  Outpatient follow-up.    Disposition.  Awaiting infectious disease recommendations.    =======================================================================      Chief Complaint:  wound dehiscence     History Of Present Illness:  Marcus Burnett is a 61 y.o. male with PMHx of  who presents to [facility] with wound dehiscence . Patient states his wife had noticed when helping with dressing change that she could see surgical hardware from his cranial procedure. Patient underwent craniotomy on 2/9 and 2/14 for resection of brain metastases by Dr. Jonne Ply  Patient and wife called Dr. Quentin Mulling office where  they were instructed to go the Emergency department. Patient denies fever or chills, pain, chest pain, shortness of breath, nausea or vomiting.        3/9  Patient was seen and examined.  S/p IND performed by Dr. Morley Kos neurosurgery.  Patient alert awake and oriented.  Patient denied any nausea, vomiting, diarrhea, abdominal pain.  Patient able to walk, talk and participate in activities.  Hemodynamically and vitally stable, blood pressure slightly low but asymptomatic.      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 performed by Illene Labrador, MD at [place] Left 07/31/2021    Left Craniotomy Resection of Tumor performed by Illene Labrador, MD at [place] N/A 08/22/2021    I & D AND CLOSURE FOR LEFT CRANIAL WOUND DEHISENCE performed by Darrol Angel, MD at [place]         Medications Prior to Admission:   Prior to Admission medications  Medication Sig Start Date End Date Taking? Authorizing Provider   levETIRAcetam (KEPPRA) 500 MG tablet Take 1 tablet by mouth 2 times daily 08/04/21 09/03/21  Amy Ardeen Jourdain, DO   famotidine (PEPCID) 20 MG tablet Take 1 tablet by mouth 2 times daily  Patient not taking: Reported on 08/22/2021 08/04/21   Amy Ardeen Jourdain, DO   amLODIPine (NORVASC) 10 MG tablet Take 0.5 tablets by mouth daily 08/05/21   Artis Delay, DO   TRULICITY 1.5 VE/9.3YB SOPN INJECT 0.5 ML (1.'5MG'$  DOSE) INTO THE SKIN EVERY 7 DAYS 11/01/20   Historical Provider, MD   metFORMIN (GLUCOPHAGE) 500 MG tablet TAKE 2 TABLETS (1,000 MG TOTAL) BY MOUTH 2 TIMES DAILY WITH MEALS. 11/06/20   Historical Provider, MD   glipiZIDE (GLUCOTROL XL) 10 MG extended release tablet TAKE 1 TABLET BY MOUTH EVERY DAY 10/10/20   Historical Provider, MD   pioglitazone  (ACTOS) 15 MG tablet TAKE 1 TABLET (15 MG TOTAL) BY MOUTH DAILY. 10/09/20   Historical Provider, MD   lisinopril (PRINIVIL;ZESTRIL) 40 MG tablet TAKE 1 TABLET BY MOUTH EVERY DAY 11/19/20   Historical Provider, MD   pravastatin (PRAVACHOL) 80 MG tablet TAKE 1 TABLET BY MOUTH EVERY DAY 10/29/20   Historical Provider, MD       Allergies:  Patient has no known allergies.    Social History:    The patient currently lives at home.   Tobacco use:   reports that he has never smoked. He has never used smokeless tobacco.  Alcohol use:   reports no history of alcohol use.  Drug use:  reports no history of drug use.     Family History:   as follows:      Problem Relation Age of Onset    Kidney Disease Father     Heart Disease Father     Diabetes Father        Review of Systems:   Pertinent positives and negatives as noted in the HPI. Otherwise complete ROS negative.    Physical Exam:    BP 108/63    Pulse 70    Temp 98 ??F (36.7 ??C) (Oral)    Resp 16    Ht '5\' 10"'$  (1.778 m)    Wt 256 lb 4.8 oz (116.3 kg)    SpO2 95%    BMI 36.78 kg/m??       General appearance: No apparent distress, appears stated age.  Eyes:  Pupils equal, round, and reactive to light. Conjunctivae/corneas clear.  HENT: Head has bandage covering  head/skull from surgergy otherwise unremarkable  Neck: Supple, with full range of motion. Trachea midline.  No gross JVD appreciated.  Respiratory:  Normal respiratory effort. Clear to auscultation, bilaterally without rales or wheezes or rhonchi.  Cardiovascular: Normal rate, regular rhythm with normal S1/S2 without murmurs.  No lower extremity edema.   Abdomen: Soft, non-tender, non-distended with normal bowel sounds.  Musculoskeletal: No joint swelling or tenderness. Normal tone. No abnormal movements.   Skin: Warm and dry. No rashes or lesions.  Neurologic:  No focal sensory/motor deficits in the upper and lower extremities. Cranial nerves:  grossly non-focal 2-12.     Psychiatric: Alert and oriented, normal insight and  thought content.   Capillary Refill: Brisk,< 3 seconds.  Peripheral Pulses: +2 palpable, equal bilaterally.         Labs:     Recent Labs     08/22/21  1236 08/23/21  1145   WBC 6.9 7.3   HGB  11.7* 11.2*   HCT 35.4* 34.5*   PLT 375 311       Recent Labs     08/22/21  1236 08/23/21  1145   NA 137 139   K 4.2 4.5   CL 104 105   CO2 22* 21*   BUN 22 15   CREATININE 1.2 1.2   CALCIUM 9.2 8.9       No results for input(s): AST, ALT, BILIDIR, BILITOT, ALKPHOS in the last 72 hours.  Recent Labs     08/22/21  1236   INR 0.96       No results for input(s): CKTOTAL, TROPONINI in the last 72 hours.  Lab Results   Component Value Date/Time    NITRU NEGATIVE 07/23/2021 06:00 PM    BLOODU NEGATIVE 07/23/2021 06:00 PM    GLUCOSEU NEGATIVE 07/23/2021 06:00 PM         Radiology:     CT HEAD W WO CONTRAST   Final Result       1. Redemonstration of left frontal parietal craniotomy with underlying resection cavity. Postsurgical pneumocephalus at the convexities has resolved but there is persistent fluid and foci of air within the resection cavity with thickening and enhancement    of the dura overlying the cavity and subjacent to the craniotomy plate. No definite empyema or cerebritis is identified based on this exam although can't definitely be excluded.   2. Redemonstration of right parietal occipital craniotomy with underlying resection cavity with interval resolution of postsurgical air subjacent to the craniotomy plate.   3. 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               **This report has been created using voice recognition software. It may contain minor errors which are inherent in voice recognition technology.**      Final report electronically signed by Dr. Adrienne Mocha, MD on 08/22/2021 3:47 PM                     Diet: regular post-op  DVT prophylaxis: lovenox   Code Status: Limited  Disposition: Inpatient    Thank you [name], MD for the opportunity to be involved in this patient's  care.    Electronically signed by Lavenia Atlas, MD on 08/23/2021 at 1:25 PM.

## 2021-08-23 NOTE — Progress Notes (Signed)
Pt admitted to  5K9 by wheelchair from 4A/B.  IV normal saline infusing into the hand right, condition patent and no redness at a rate of 50 mls/ hour with about 600 mls in the bag still. IV site free of s/s of infection or infiltration. Vital signs obtained. Assessment complete. Oriented to room. All questions answered with no further questions at this time.   Two nurse skin assessment performed by Tyara Dassow RN and Vallery Ridge. Oriented to room. Policies and procedures for 5K explained. A Fall prevention and safety brochure discussed with patient.  Bed alarm on. Call light in reach.

## 2021-08-23 NOTE — Progress Notes (Signed)
Neurosurgery Progress Note    Patient:  Marcus Burnett      Unit/Bed:4A-19/019-A    Date of Birth: Oct 18, 1960    MRN: [id number]     Acct: [id number]     Admit date: 08/22/2021    Chief Complaint   Patient presents with    Wound Dehiscence       Patient Seen, Chart, Physician notes, Labs, Radiology studies reviewed.    Subjective: Feeling good        Past, Family, Social History unchanged from admission.    Diet:  ADULT DIET; Regular    Medications:  Scheduled Meds:   ceFAZolin  1,000 mg IntraVENous Q8H    gentamicin  5 mg/kg IntraVENous Q24H    sodium chloride flush  10 mL IntraVENous 2 times per day    enoxaparin  30 mg SubCUTAneous BID    amLODIPine  5 mg Oral Daily    famotidine  20 mg Oral BID    levETIRAcetam  500 mg Oral BID    lisinopril  40 mg Oral Daily    pravastatin  80 mg Oral Daily    insulin lispro  0-4 Units SubCUTAneous TID WC    insulin lispro  0-4 Units SubCUTAneous Nightly    insulin glargine  5 Units SubCUTAneous Nightly     Continuous Infusions:   sodium chloride      dextrose       PRN Meds:sodium chloride flush, sodium chloride, ondansetron **OR** ondansetron, polyethylene glycol, acetaminophen **OR** acetaminophen, glucose, dextrose bolus **OR** dextrose bolus, glucagon (rDNA), dextrose    Objective:    Vitals: BP 109/65    Pulse 66    Temp 97.7 ??F (36.5 ??C) (Oral)    Resp 18    Ht '5\' 10"'$  (1.778 m)    Wt 256 lb 4.8 oz (116.3 kg)    SpO2 94%    BMI 36.78 kg/m??   Physical Exam:  Alert and attentive.  Language appropriate, with no aphasia.  Pupils equal.  Facial strength symmetric.  Wound is clean and dressings dry and non suppurative.     24 hour intake/output:  Intake/Output Summary (Last 24 hours) at 08/23/2021 0958  Last data filed at 08/23/2021 0916  Gross per 24 hour   Intake 1450 ml   Output 850 ml   Net 600 ml     Last 3 weights:  Wt Readings from Last 3 Encounters:   08/22/21 256 lb 4.8 oz (116.3 kg)   08/16/21 256 lb (116.1 kg)   08/16/21 256 lb (116.1 kg)         CBC:   Recent Labs      08/22/21  1236   WBC 6.9   HGB 11.7*   PLT 375     BMP:    Recent Labs     08/22/21  1236   NA 137   K 4.2   CL 104   CO2 22*   BUN 22   CREATININE 1.2   GLUCOSE 188*     Calcium:  Recent Labs     08/22/21  1236   CALCIUM 9.2     Magnesium:No results for input(s): MG in the last 72 hours.  Glucose:  Recent Labs     08/22/21  2058 08/23/21  0626   POCGLU 121* 127*     HgbA1C: No results for input(s): LABA1C in the last 72 hours.  Lipids: No results for input(s): CHOL, TRIG, HDL, LDL, LDLCALC in the last 72 hours.  Radiology reports as per the Radiologist  Radiology: CT HEAD W WO CONTRAST    Result Date: 08/22/2021  PROCEDURE: CT HEAD W WO CONTRAST CLINICAL INFORMATION: Cranial wound deshiscense. Discharge at the craniowound. COMPARISON: MR brain dated 08/02/2021 and CT head dated the 15th 2023. TECHNIQUE: Helical CT of the head before and after intravenous administration of 75 mL Isovue-370 injected in the right wrist with axial, sagittal and coronal reconstructions. All CT scans at this facility use dose modulation, iterative reconstruction, and/or weight-based dosing when appropriate to reduce radiation dose to as low as reasonably achievable. FINDINGS: Redemonstration of a left frontal parietal craniotomy with subjacent left frontal resection cavity. There is hypodense fluid within the resection cavity and small foci of air. Postsurgical air at the frontal convexity has resolved in the interval. There is dural thickening and enhancement subjacent to the craniotomy plate. No definite focal fluid collection is present in the scalp overlying the craniotomy plate. Redemonstration of right parietal occipital craniotomy with underlying resection cavity, similar to prior exam. There is dural thickening and enhancement subjacent the craniotomy plate, similar to prior exam. Postsurgical air subjacent to the plate has resolved in the interval. No focal areas of nodular enhancement are present at the resection cavities. There  is a moderate-sized area of encephalomalacia in the right frontal lobe, similar to prior exams. Ventricular size is similar to prior exams. Orbits are unremarkable. There is a stable mucous retention cyst or mucocele in right frontal sinus. Paranasal sinuses and mastoid air cells are otherwise clear.      1. Redemonstration of left frontal parietal craniotomy with underlying resection cavity. Postsurgical pneumocephalus at the convexities has resolved but there is persistent fluid and foci of air within the resection cavity with thickening and enhancement  of the dura overlying the cavity and subjacent to the craniotomy plate. No definite empyema or cerebritis is identified based on this exam although can't definitely be excluded. 2. Redemonstration of right parietal occipital craniotomy with underlying resection cavity with interval resolution of postsurgical air subjacent to the craniotomy plate. 3. 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 **This report has been created using voice recognition software. It may contain minor errors which are inherent in voice recognition technology.** Final report electronically signed by Dr. Adrienne Mocha, MD on 08/22/2021 3:47 PM                   Assessment:    Principal Problem:    Wound dehiscence  Active Problems:    Type 2 diabetes mellitus without complication, without long-term current use of insulin ([place])    Primary hypertension    Hyperlipidemia  Resolved Problems:    * No resolved hospital problems. *    Patient with above history was evaluated.  After careful evaluation of the imaging and clinical elements the patient is post op day #1.  No changes in neurological status have been recorded.  The patient is stable and with small amount of pain in the wound area.  Dressings were dry and no signs of infection were seen.  Awaiting culture results for management of any infectious pathogen.  We will add prophylactic antibiotic regimen with Ancef and gentamicin.   We will also consult ID for any additional recommendations.  Patient's wife and patient were oriented of the plan and no questions were present at the closure of the interview.    Plan:  1.  Continue prophylactic antibiotics.    2.  ID consult for any  additional recommendations.    3.  Observe for any changes in neurological status.        Electronically signed by Darrol Angel, MD on 08/23/2021 at 9:58 AM    Neurosurgery

## 2021-08-23 NOTE — Care Coordination-Inpatient (Signed)
Case Management Assessment  Initial Evaluation    Date/Time of Evaluation: 08/23/2021 12:29 PM  Assessment Completed by: Dellia Beckwith, RN    If patient is discharged prior to next notation, then this note serves as note for discharge by case management.    Patient Name: Tina Temme                   Date of Birth: 1960/10/02  Diagnosis: Wound dehiscence, surgical, initial encounter [T81.31XA]                   Date / Time: 08/22/2021 11:09 AM  Location: 4A-19/019-A     Patient Admission Status: Inpatient   Readmission Risk Low 0-14, Mod 15-19), High > 20: Readmission Risk Score: 20.1    Current PCP: [name], MD  PCP verified by CM? Yes    Chart Reviewed: Yes      History Provided by: Patient  Patient Orientation: Alert and Oriented    Patient Cognition: Alert    Hospitalization in the last 30 days (Readmission):  Yes    If yes, Readmission Assessment in CM Navigator will be completed.    Advance Directives:      Code Status: Limited   Patient's Primary Decision Maker is: Legal Next of Kin    Primary Decision Maker: [place] [phone]    Discharge Planning:    Patient lives with: Spouse/Significant Other Type of Home: House  Primary Care Giver: Self  Patient Support Systems include: Spouse/Significant Other   Current Financial resources: Medicaid  Current community resources: None  Current services prior to admission: None            Current DME:              Type of Home Care services:  None    ADLS  Prior functional level: Independent in ADLs/IADLs  Current functional level: Independent in ADLs/IADLs    Family can provide assistance at DC: Yes  Would you like Case Management to discuss the discharge plan with any other family members/significant others, and if so, who?    Plans to Return to Present Housing: Yes  Other Identified Issues/Barriers to RETURNING to current housing: medical complications  Potential Assistance needed at discharge: N/A            Potential DME:     Patient expects to discharge to: [facility] for transportation at discharge: Self    Financial    Payor: [place] / Plan: [employer] MEDICAID / Product Type: *No Product type* /     Does insurance require precert for SNF: Yes    Potential assistance Purchasing Medications:    Meds-to-Beds request:        [id number] [id number] Phone: [phone]: [phone]     Notes:    Factors facilitating achievement of predicted outcomes: Family support    Barriers to discharge: Medical complications    Additional Case Management Notes: From ED, diabetes management, Lovenox, Pepcid, Keppra, Pravachol.     Procedure:   3/8 CT Head WO Contrast 1. Redemonstration of left frontal parietal craniotomy with underlying resection cavity. Postsurgical pneumocephalus at the convexities has resolved but there is persistent fluid and foci of air within the resection cavity with thickening and enhancement    of the dura overlying the cavity  and subjacent to the craniotomy plate. No definite empyema or cerebritis is identified based on this exam although can't definitely be excluded.   2. Redemonstration of right parietal occipital craniotomy with underlying resection cavity with interval resolution of postsurgical air subjacent to the craniotomy plate.   3. 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     3/9 ECHO     The Plan for Transition of Care is related to the following treatment goals of Wound dehiscence, surgical, initial encounter [T81.31XA]    Patient Goals/Plan/Treatment Preferences: Met with [name]. He currently lives at home with his wife. He is independent. Plan is to return home at discharge. He denies need for DME and declines HH. Will follow.   Transportation/Food Security/Housekeeping Addressed: No issues identified.     Dellia Beckwith, RN  Case Management Department
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
## 2021-08-23 NOTE — Other (Signed)
08/23/21 1029   Safe Environment   Safety Measures Other (comment)  (VN safety round)   VN called into patients room and introduced myself and role. Patient answered and permitted video. Video activated. . Patient resting comfortably in bed. . Patient voiced no needs or concerns at this time. Call light within reach.

## 2021-08-23 NOTE — Progress Notes (Signed)
[place]  INPATIENT OCCUPATIONAL THERAPY  [facility] 4A  EVALUATION    Time:   Time In: 1610  Time Out: 1205  Timed Code Treatment Minutes: 10 Minutes  Minutes: 20          Date: 08/23/2021  Patient Name: Marcus Burnett,   Gender: male      MRN: [id number]  DOB: 06-14-1961  (60 y.o.)  Referring Practitioner: Lavenia Atlas, MD  Diagnosis: Wound dehiscence  Additional Pertinent Hx: Calem Cocozza is a 61 y.o. male with PMHx of  who presents to [facility] with wound dehiscence . Patient states his wife had noticed when helping with dressing change that she could see surgical hardware from his cranial procedure. Patient underwent craniotomy on 2/9 and 2/14 for resection of brain metastases by Dr. Jonne Ply  Patient and wife called Dr. Quentin Mulling office where they were instructed to go the Emergency department. Patient denies fever or chills, pain, chest pain, shortness of breath, nausea or vomiting.    At the time of my assessment patient noted his only pain post-operatively was 2/10    Restrictions/Precautions:  Restrictions/Precautions: General Precautions    Subjective  Chart Reviewed: Yes, Orders, Progress Notes, History and Physical, Imaging, Operative Notes  Patient assessed for rehabilitation services?: Yes  Response to previous treatment: Patient with no complaints from previous session  Family / Caregiver Present: No    Subjective: RN ok'ed OT session. Pt long sitting in bed on OT arrival and agreeable to therapy. Pt stated he went to outpatient OT 1 week ago and was discharged d/t being indpendent    Pain: 0/10    Vitals: Vitals not assessed per clinical judgement, see nursing flowsheet    Social/Functional History:  Lives With: Spouse  Type of Home: House  Home Layout: One level  Home Access: Stairs to enter with rails  Entrance Stairs - Number of Steps: 3 STE  Home Equipment: None   Bathroom Shower/Tub: Tourist information centre manager: Standard       ADL Assistance:  Independent  Homemaking Assistance: Independent  Ambulation Assistance: Independent  Transfer Assistance: Independent    Active Driver: No  Patient's Driver Info: spouse          VISION:WFL    HEARING:  WFL    COGNITION: WFL    RANGE OF MOTION:  Bilateral Upper Extremity:  WFL    STRENGTH:  Bilateral Upper Extremity:  WFL    SENSATION:   WFL    ADL:   Feeding: Independent.  For lunch  Footwear Management: Independent.  Donning bilateral socks  .    BALANCE:  Sitting Balance:  Independent.    Standing Balance: Independent. Standing to talk to therapist for >5 minutes     BED MOBILITY:  Supine to Sit: Independent      TRANSFERS:  Sit to Stand:  Independent.    Stand to Sit: Independent.      FUNCTIONAL MOBILITY:  Assistive Device: None  Assist Level:  Independent.   Distance:  opposite side of bed to recliner   No LOB noted, appropriate safety awareness          Activity Tolerance:  Patient tolerance of  treatment: Good treatment tolerance      Assessment:  Assessment: Pt IND with ADLs/IADLs and does not require skilled OT services at this time  Prognosis: Good  REQUIRES OT FOLLOW-UP: No  Decision Making: Low Complexity    Treatment Initiated: Treatment and education initiated within  context of evaluation.  Evaluation time included review of current medical information, gathering information related to past medical, social and functional history, completion of standardized testing, formal and informal observation of tasks, assessment of data and development of plan of care and goals.  Treatment time included skilled education and facilitation of tasks to increase safety and independence with ADL's for improved functional independence and quality of life.    Discharge Recommendations:  Home with assist PRN    Patient Education:     Patient Education  Education Given To: Patient  Education Provided: Role of Therapy, Plan of Care  Education Method: Demonstration, Verbal  Barriers to Learning: None  Education Outcome:  Verbalized understanding, Demonstrated understanding    Equipment Recommendations:  Equipment Needed: No    Plan:  Times Per Week: n/a.      Goals:  Patient goals : return home  Short Term Goals  Time Frame for Short Term Goals: n/a         Following session, patient left in safe position with all fall risk precautions in place.

## 2021-08-23 NOTE — Progress Notes (Signed)
Pharmacy Note  Aminoglycoside Consult    Marcus Burnett is a 61 y.o. male ordered Gentamicin for surgical prophlaxis, head wound; consult received from Dr. Jilda Panda to manage therapy. Also receiving Ancef.    Allergies:  Patient has no known allergies.     Temp max: 97.7    Recent Labs     08/22/21  1236 08/23/21  1145   BUN 22 15   CREATININE 1.2 1.2   WBC 6.9 7.3       Intake/Output Summary (Last 24 hours) at 08/23/2021 1308  Last data filed at 08/23/2021 1255  Gross per 24 hour   Intake 1690 ml   Output 1050 ml   Net 640 ml     Culture Date Source Results   08/22/21 swab left cranial  Rare gram positive cocci occurring singly and in pairs.     Height:   Ht Readings from Last 1 Encounters:   08/22/21 '5\' 10"'$  (1.778 m)     Weight:  Wt Readings from Last 1 Encounters:   08/22/21 256 lb 4.8 oz (116.3 kg)     Estimated Creatinine Clearance: 82 mL/min (based on SCr of 1.2 mg/dL).    Assessment/Plan:  Initiate extended-interval dosing of Gentamicin 451.6 mg (5 mg/kg utilizing adjusted body weight) IVPB Q24H  Planned level tonight 08/23/21 @ 2200 (~8 hours after dose is given)  Pharmacy to determine/adjust frequency based on level plotted on [place] [employer] below      Pharmacy will continue to follow. Thank you for the consult.    Barkley Boards, PharmD, BCPS  08/23/2021  1:13 PM

## 2021-08-24 LAB — POCT GLUCOSE
POC Glucose: 139 mg/dl — ABNORMAL HIGH (ref 70–108)
POC Glucose: 124 mg/dl — ABNORMAL HIGH (ref 70–108)
POC Glucose: 118 mg/dl — ABNORMAL HIGH (ref 70–108)

## 2021-08-24 MED ORDER — CEPHALEXIN 500 MG PO CAPS
500 MG | ORAL_CAPSULE | Freq: Four times a day (QID) | ORAL | 0 refills | Status: AC
Start: 2021-08-24 — End: 2021-09-03
  Filled 2021-08-24: qty 40, 10d supply, fill #0

## 2021-08-24 MED ORDER — CEFAZOLIN SODIUM-DEXTROSE 1-4 GM/50ML-% IV SOLN
1-4 GM/50ML-% | Freq: Three times a day (TID) | INTRAVENOUS | Status: DC
Start: 2021-08-24 — End: 2021-08-24
  Administered 2021-08-24 (×2): 1000 mg via INTRAVENOUS

## 2021-08-24 MED FILL — CEFAZOLIN SODIUM-DEXTROSE 1-4 GM/50ML-% IV SOLN: 1-4 GM/50ML-% | INTRAVENOUS | Qty: 50

## 2021-08-24 MED FILL — ENOXAPARIN SODIUM 30 MG/0.3ML IJ SOSY: 30 MG/0.3ML | INTRAMUSCULAR | Qty: 0.3

## 2021-08-24 MED FILL — CEFAZOLIN SODIUM 1 G IJ SOLR: 1 g | INTRAMUSCULAR | Qty: 1000

## 2021-08-24 MED FILL — FAMOTIDINE 20 MG PO TABS: 20 MG | ORAL | Qty: 1

## 2021-08-24 NOTE — Progress Notes (Signed)
Spoke to patient regarding discharge. Wife plans to pick patient up for discharge later this afternoon.  Electronically signed by Noreene Filbert, RN on 08/24/2021 at 12:53 PM

## 2021-08-24 NOTE — Progress Notes (Signed)
Patient's IV leaking and removed. Patient to discharge this afternoon, and start PO abx. Did not replace IV at this time.  Electronically signed by Noreene Filbert, RN on 08/24/2021 at 1:25 PM

## 2021-08-24 NOTE — Progress Notes (Signed)
Patient discharged with Keflex for 10 days, discharge instructions, and all follow up appointments explained. Educated patient to keep incision clean and dry.  AVS signed. All questions answered. Wife to transport home, and all belongings taken with patient to lobby in [place] chair.   Electronically signed by Noreene Filbert, RN on 08/24/2021 at 4:11 PM

## 2021-08-24 NOTE — Plan of Care (Signed)
Problem: Discharge Planning  Goal: Discharge to home or other facility with appropriate resources  Outcome: Progressing  Flowsheets (Taken 08/24/2021 0508)  Discharge to home or other facility with appropriate resources:   Identify barriers to discharge with patient and caregiver   Identify discharge learning needs (meds, wound care, etc)   Arrange for needed discharge resources and transportation as appropriate     Problem: Safety - Adult  Goal: Free from fall injury  Outcome: Progressing  Flowsheets (Taken 08/24/2021 0508)  Free From Fall Injury:   Instruct family/caregiver on patient safety   Based on caregiver fall risk screen, instruct family/caregiver to ask for assistance with transferring infant if caregiver noted to have fall risk factors     Problem: Skin/Tissue Integrity  Goal: Absence of new skin breakdown  Description: 1.  Monitor for areas of redness and/or skin breakdown  2.  Assess vascular access sites hourly  3.  Every 4-6 hours minimum:  Change oxygen saturation probe site  4.  Every 4-6 hours:  If on nasal continuous positive airway pressure, respiratory therapy assess nares and determine need for appliance change or resting period.  Outcome: Progressing     Problem: Pain  Goal: Verbalizes/displays adequate comfort level or baseline comfort level  Outcome: Progressing  Flowsheets (Taken 08/24/2021 0508)  Verbalizes/displays adequate comfort level or baseline comfort level:   Administer analgesics based on type and severity of pain and evaluate response   Encourage patient to monitor pain and request assistance   Assess pain using appropriate pain scale   Implement non-pharmacological measures as appropriate and evaluate response   Notify Licensed Independent Practitioner if interventions unsuccessful or patient reports new pain   Consider cultural and social influences on pain and pain management     Problem: Chronic Conditions and Co-morbidities  Goal: Patient's chronic conditions and  co-morbidity symptoms are monitored and maintained or improved  Outcome: Progressing  Flowsheets (Taken 08/24/2021 0508)  Care Plan - Patient's Chronic Conditions and Co-Morbidity Symptoms are Monitored and Maintained or Improved:   Monitor and assess patient's chronic conditions and comorbid symptoms for stability, deterioration, or improvement   Collaborate with multidisciplinary team to address chronic and comorbid conditions and prevent exacerbation or deterioration   Update acute care plan with appropriate goals if chronic or comorbid symptoms are exacerbated and prevent overall improvement and discharge     Problem: Neurosensory - Adult  Goal: Achieves stable or improved neurological status  Outcome: Progressing  Flowsheets (Taken 08/24/2021 0508)  Achieves stable or improved neurological status:   Assess for and report changes in neurological status   Initiate measures to prevent increased intracranial pressure  Goal: Absence of seizures  Outcome: Progressing  Flowsheets (Taken 08/24/2021 0508)  Absence of seizures:   Monitor for seizure activity.  If seizure occurs, document type and location of movements and any associated apnea   If seizure occurs, turn head to side and suction secretions as needed   Administer anticonvulsants as ordered  Goal: Remains free of injury related to seizures activity  Outcome: Progressing  Flowsheets (Taken 08/24/2021 0508)  Remains free of injury related to seizure activity:   Maintain airway, patient safety  and administer oxygen as ordered   Monitor patient for seizure activity, document and report duration and description of seizure to Licensed Independent Practitioner   If seizure occurs, turn patient to side and suction secretions as needed   Instruct patient/family to call for assistance with activity based on assessment  Goal: Achieves maximal functionality  and self care  Outcome: Progressing  Flowsheets (Taken 08/24/2021 0508)  Achieves maximal functionality and self  care:   Monitor swallowing and airway patency with patient fatigue and changes in neurological status   Encourage and assist patient to increase activity and self care with guidance from physical therapy/occupational therapy   Encourage visually impaired, hearing impaired and aphasic patients to use assistive/communication devices     Problem: Cardiovascular - Adult  Goal: Maintains optimal cardiac output and hemodynamic stability  Outcome: Progressing  Flowsheets (Taken 08/24/2021 0508)  Maintains optimal cardiac output and hemodynamic stability:   Monitor blood pressure and heart rate   Monitor urine output and notify Licensed Independent Practitioner for values outside of normal range   Assess for signs of decreased cardiac output  Goal: Absence of cardiac dysrhythmias or at baseline  Outcome: Progressing  Flowsheets (Taken 08/24/2021 0508)  Absence of cardiac dysrhythmias or at baseline:   Monitor cardiac rate and rhythm   Assess for signs of decreased cardiac output     Care plan reviewed with patient.  Patient verbalize understanding of the plan of care and contribute to goal setting.

## 2021-08-24 NOTE — Discharge Instructions (Addendum)
You have been started on antibiotics take for total of 10 days.  Follow-up with your primary care physician for your blood sugars and blood pressure.  Follow-up with neurosurgeon Dr. Jonne Ply for the suture removal.  Follow-up with infectious disease Dr. Deeann Cree for wound care and antibiotics.  Follow-up with your oncologist for cancer treatment.  Your blood pressure was low normal so I have discontinued your amlodipine please do not take amlodipine at home.

## 2021-08-24 NOTE — Care Coordination-Inpatient (Signed)
08/24/21, 11:36 AM EST    Patient goals/plan/ treatment preferences discussed by Case Manager and Education officer, museum.  Patient goals/plan/ treatment preferences reviewed with patient/ family.  Patient/ family verbalize understanding of discharge plan and are in agreement with goal/plan/treatment preferences.  Understanding was demonstrated using the teach back method.  AVS provided by RN at time of discharge, which includes all necessary medical information pertaining to the patients current course of illness, treatment, post-discharge goals of care, and treatment preferences.     Services At/After Discharge: None

## 2021-08-24 NOTE — Discharge Instructions (Signed)
Keep head incision clean, and dry.

## 2021-08-24 NOTE — Progress Notes (Signed)
[place]  PHYSICAL THERAPY MISSED TREATMENT NOTE  [facility] MED 5K    Date: 08/24/2021  Patient Name: Marcus Burnett        MRN: [id number]   DOB: 1961-05-24  (61 y.o.)  Gender: male                REASON FOR MISSED TREATMENT:  Per OT note, pt is independent with mobility. Confirmed with pt that he feels fine with his mobility and does not feel that PT evaluation is necessary. Will signoff.      Janetta Hora. Burt Knack, [place]

## 2021-08-24 NOTE — Progress Notes (Signed)
Progress note: Infectious diseases    Patient - Marcus Burnett,  Age - 61 y.o.   DOB - [birth year]      Room Number - 6V-78/469-G   MRN -  [id number]   Acct # - [id number]  Date of Admission -  08/22/2021 11:09 AM    SUBJECTIVE:   No new issues  OBJECTIVE   VITALS    height is '5\' 10"'$  (1.778 m) and weight is 256 lb 4.8 oz (116.3 kg). His oral temperature is 97.7 ??F (36.5 ??C). His blood pressure is 124/79 and his pulse is 68. His respiration is 16 and oxygen saturation is 100%.       Wt Readings from Last 3 Encounters:   08/22/21 256 lb 4.8 oz (116.3 kg)   08/16/21 256 lb (116.1 kg)   08/16/21 256 lb (116.1 kg)       I/O (24 Hours)    Intake/Output Summary (Last 24 hours) at 08/24/2021 0904  Last data filed at 08/23/2021 1500  Gross per 24 hour   Intake 250 ml   Output 425 ml   Net -175 ml       General Appearance  Awake, alert, oriented,     HEENT - clean scalp wound, no drainage  Neck - Supple, no mass  Lungs -  Bilateral  air entry, no rhonchi, no wheeze  Cardiovascular - Heart sounds are normal.     Abdomen - soft, not distended, nontender,   Neurologic -oriented  Skin - No bruising or bleeding  Extremities - No edema, no cyanosis, clubbing     MEDICATIONS:      ceFAZolin  1,000 mg IntraVENous Q8H    sodium chloride flush  10 mL IntraVENous 2 times per day    enoxaparin  30 mg SubCUTAneous BID    [Held by provider] amLODIPine  5 mg Oral Daily    famotidine  20 mg Oral BID    levETIRAcetam  500 mg Oral BID    lisinopril  40 mg Oral Daily    pravastatin  80 mg Oral Daily    insulin lispro  0-4 Units SubCUTAneous TID WC    insulin lispro  0-4 Units SubCUTAneous Nightly    insulin glargine  5 Units SubCUTAneous Nightly      sodium chloride      dextrose       sodium chloride flush, sodium chloride, ondansetron **OR** ondansetron, polyethylene glycol, acetaminophen **OR** acetaminophen, glucose, dextrose bolus **OR** dextrose bolus,  glucagon (rDNA), dextrose      LABS:     CBC:   Recent Labs     08/22/21  1236 08/23/21  1145   WBC 6.9 7.3   HGB 11.7* 11.2*   PLT 375 311     BMP:    Recent Labs     08/22/21  1236 08/23/21  1145   NA 137 139   K 4.2 4.5   CL 104 105   CO2 22* 21*   BUN 22 15   CREATININE 1.2 1.2   GLUCOSE 188* 143*     Calcium:  Recent Labs     08/23/21  1145   CALCIUM 8.9     Ionized Calcium:No results for input(s): IONCA in the last 72 hours.  Magnesium:No results for input(s): MG in the last 72 hours.  Phosphorus:No results for input(s): PHOS in the last 72 hours.  BNP:No results for input(s): BNP in the last 72 hours.  Glucose:  Recent Labs  08/23/21  1737 08/23/21  2014 08/24/21  0741   POCGLU 206* 139* 124*     HgbA1C: No results for input(s): LABA1C in the last 72 hours.  INR:   Recent Labs     08/22/21  1236   INR 0.96     Hepatic: No results for input(s): ALKPHOS, ALT, AST, PROT, BILITOT, BILIDIR, LABALBU in the last 72 hours.  Amylase and Lipase:No results for input(s): LACTA, AMYLASE in the last 72 hours.  Lactic Acid: No results for input(s): LACTA in the last 72 hours.  Troponin: No results for input(s): CKTOTAL, CKMB, TROPONINI in the last 72 hours.  BNP: No results for input(s): BNP in the last 72 hours.    CULTURES:   MSSA wound    IMAGING:         Problem list of patient:     Patient Active Problem List   Diagnosis Code    Neoplasm of brain causing mass effect on adjacent structures (HCC) D49.6, G93.5    Altered mental status R41.82    History of cancer metastatic to brain Z85.89    Renal cell carcinoma (HCC) C64.9    Goals of care, counseling/discussion Z71.89    Wound dehiscence T81.30XA    Type 2 diabetes mellitus without complication, without long-term current use of insulin (HCC) E11.9    Primary hypertension I10    Hyperlipidemia E78.5         ASSESSMENT/PLAN   Scalp superfical wound infection: on iv ancef  Will discharge with oral antibiotic for 10 days  Will arrange follow up      Avel Sensor, MD, MD, [place] 08/24/2021 9:04 AM

## 2021-08-24 NOTE — Discharge Summary (Signed)
Hospital Medicine Discharge Summary      Patient Identification:   Marcus Burnett   DOB: 1960-11-08  MRN: [id number]   Account: [id number]   Patient's PCP: [name], MD    Admit Date: 08/22/2021   Discharge Date:   08/24/2021    Admitting Physician: Letta Pate, PA-C  Discharge Physician: Lavenia Atlas, MD       Discharge Diagnoses:  Cranial Wound dehiscence from recent craniotomy.  Infected with MSSA  I and D and closure done by neurosurgery.  Scalp culture grew MSSA was evaluated by ID.  Recommended Keflex for 10 days.    Metastatic renal cell carcinoma.  Patient is being evaluated by oncology and radiation oncology outpatient.  As of now he is not interested in treatment.  S/p nephrectomy.  Outpatient follow-up    S/p metastatic mass resection and craniotomy on 07/31/2021.  By Dr. Jonne Ply.    Non-insulin-dependent type II diabetes  Most recent HbA1c 6.9.  At home on metformin.  Outpatient follow-up with PCP.    Primary hypertension, at home on lisinopril and Norvasc.  Blood pressure soft.   Amlodipine was discontinued at discharge    Normocytic anemia.  Likely anemia of chronic disease in the setting of malignancy.      Hospital Course:   Marcus Burnett is a 61 y.o. male with PMHx of  who presents to [facility] with wound dehiscence . Patient states his wife had noticed when helping with dressing change that she could see surgical hardware from his cranial procedure. Patient underwent craniotomy on 2/9 and 2/14 for resection of brain metastases by Dr. Jonne Ply  Patient and wife called Dr. Quentin Mulling office where they were instructed to go the Emergency department. Patient denies fever or chills, pain, chest pain, shortness of breath, nausea or vomiting.    Patient went for I&D with neurosurgery.  Postsurgically patient hemodynamically and vitally stable.  Culture from the wound grew MSSA, ID was consulted recommended Keflex for 10 days.  Neurosurgery and ID okay with discharge.  Patient to  follow-up with neurosurgery and ID as an outpatient.              Exam:   Vitals:  Vitals:    08/23/21 1745 08/23/21 1930 08/24/21 0415 08/24/21 0823   BP: 130/72 128/72 128/68 124/79   Pulse: 69 68 70 68   Resp: '16 16 16 16   '$ Temp: 97.8 ??F (36.6 ??C) 98.7 ??F (37.1 ??C) 97.9 ??F (36.6 ??C) 97.7 ??F (36.5 ??C)   TempSrc: Oral Oral Oral Oral   SpO2: 100% 99% 100% 100%   Weight:       Height:         Weight: Weight: 256 lb 4.8 oz (116.3 kg)     General appearance: No apparent distress, well developed, appears stated age.  Eyes:  Pupils equal, round, and reactive to light. Conjunctivae/corneas clear.  HENT: Head normal in appearance.  Head covered with the dressing.  External nares normal.  Oral mucosa moist without lesions.  Hearing grossly intact.   Neck: Supple, with full range of motion. Trachea midline.  No gross JVD appreciated.  Respiratory:  Normal respiratory effort. Clear to auscultation, bilaterally without rales or wheezes or rhonchi.  Cardiovascular: Normal rate, regular rhythm with normal S1/S2 without murmurs.  No lower extremity edema.   Abdomen: Soft, non-tender, non-distended with normal bowel sounds.  Musculoskeletal: There is no joint swelling or tenderness. Normal tone. No abnormal movements.   Skin: Warm  and dry. No rashes or lesions.  Neurologic:  No focal sensory/motor deficits in the upper and lower extremities. Cranial nerves:  grossly non-focal 2-12.     Psychiatric: Alert and oriented, normal insight and thought content.   Capillary Refill: Brisk,< 3 seconds.  Peripheral Pulses: +2 palpable, equal bilaterally.       Significant Diagnostic Studies    Labs: For convenience and continuity at follow-up the following most recent labs are provided:  CBC:    Lab Results   Component Value Date/Time    WBC 7.3 08/23/2021 11:45 AM    HGB 11.2 08/23/2021 11:45 AM    HCT 34.5 08/23/2021 11:45 AM    PLT 311 08/23/2021 11:45 AM     Renal:    Lab Results   Component Value Date/Time    NA 139 08/23/2021 11:45 AM     K 4.5 08/23/2021 11:45 AM    CL 105 08/23/2021 11:45 AM    CO2 21 08/23/2021 11:45 AM    BUN 15 08/23/2021 11:45 AM    CREATININE 1.2 08/23/2021 11:45 AM    CALCIUM 8.9 08/23/2021 11:45 AM       Radiology:   CT HEAD W WO CONTRAST   Final Result       1. Redemonstration of left frontal parietal craniotomy with underlying resection cavity. Postsurgical pneumocephalus at the convexities has resolved but there is persistent fluid and foci of air within the resection cavity with thickening and enhancement    of the dura overlying the cavity and subjacent to the craniotomy plate. No definite empyema or cerebritis is identified based on this exam although can't definitely be excluded.   2. Redemonstration of right parietal occipital craniotomy with underlying resection cavity with interval resolution of postsurgical air subjacent to the craniotomy plate.   3. 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               **This report has been created using voice recognition software. It may contain minor errors which are inherent in voice recognition technology.**      Final report electronically signed by Dr. Adrienne Mocha, MD on 08/22/2021 3:47 PM             Consults:   IP CONSULT TO INFECTIOUS DISEASES      Disposition: Home  Condition at Discharge: Stable    Code Status:  Limited     Patient Instructions:    Discharge lab work: None  Activity: activity as tolerated  Diet: ADULT DIET; Regular      Follow-up visits:   Illene Labrador, MD  [address]  [address]  [place] [address]  [phone]    Schedule an appointment as soon as possible for a visit  The Office is currently Closed    Avel Sensor, MD  [place] [address]  [phone]    Schedule an appointment as soon as possible for a visit on 09/05/2021  your appointment time is at 8:15a, Please arrive 16mns early, Bring insurance card & Photo ID, co-pay, medication bottles & completed forms.         Discharge  Medications:        Medication List        START taking these medications      cephALEXin 500 MG capsule  Commonly known as: KEFLEX  Take 1 capsule by mouth 4 times daily for 10 days  CONTINUE taking these medications      glipiZIDE 10 MG extended release tablet  Commonly known as: GLUCOTROL XL     levETIRAcetam 500 MG tablet  Commonly known as: KEPPRA  Take 1 tablet by mouth 2 times daily     lisinopril 40 MG tablet  Commonly known as: PRINIVIL;ZESTRIL     metFORMIN 500 MG tablet  Commonly known as: GLUCOPHAGE     pravastatin 80 MG tablet  Commonly known as: PRAVACHOL     Trulicity 1.5 BP/1.0CH SC injection  Generic drug: dulaglutide            STOP taking these medications      amLODIPine 10 MG tablet  Commonly known as: NORVASC     famotidine 20 MG tablet  Commonly known as: PEPCID     pioglitazone 15 MG tablet  Commonly known as: ACTOS               Where to Get Your Medications        These medications were sent to [facility] [address] [place]  [address] [place], [place] [address]      Phone: [phone]   cephALEXin 500 MG capsule              Time Spent on discharge is 45 minutes in the examination, evaluation, counseling and review of medications and discharge plan.    Thank you [name], MD for the opportunity to be involved in this patient's care.      Signed:    Electronically signed by Lavenia Atlas, MD on 08/24/21 at 2:28 PM EST

## 2021-08-24 NOTE — Progress Notes (Signed)
Neurosurgery Progress Note    Patient:  Marcus Burnett      Unit/Bed:5K-09/009-A    Date of Birth: 10-23-1960    MRN: [id number]     Acct: [id number]     Admit date: 08/22/2021    Chief Complaint   Patient presents with    Wound Dehiscence       Patient Seen, Chart, Physician notes, Labs, Radiology studies reviewed.    Subjective: Feeling great        Past, Family, Social History unchanged from admission.    Diet:  ADULT DIET; Regular    Medications:  Scheduled Meds:   ceFAZolin  1,000 mg IntraVENous Q8H    sodium chloride flush  10 mL IntraVENous 2 times per day    enoxaparin  30 mg SubCUTAneous BID    [Held by provider] amLODIPine  5 mg Oral Daily    famotidine  20 mg Oral BID    levETIRAcetam  500 mg Oral BID    lisinopril  40 mg Oral Daily    pravastatin  80 mg Oral Daily    insulin lispro  0-4 Units SubCUTAneous TID WC    insulin lispro  0-4 Units SubCUTAneous Nightly    insulin glargine  5 Units SubCUTAneous Nightly     Continuous Infusions:   sodium chloride      dextrose       PRN Meds:sodium chloride flush, sodium chloride, ondansetron **OR** ondansetron, polyethylene glycol, acetaminophen **OR** acetaminophen, glucose, dextrose bolus **OR** dextrose bolus, glucagon (rDNA), dextrose    Objective:    Vitals: BP 124/79    Pulse 68    Temp 97.7 ??F (36.5 ??C) (Oral)    Resp 16    Ht '5\' 10"'$  (1.778 m)    Wt 256 lb 4.8 oz (116.3 kg)    SpO2 100%    BMI 36.78 kg/m??   Physical Exam:  Alert and attentive.  Wound clean and dressings clean and non suppurative.   Language appropriate, with no aphasia.  Pupils equal.  Facial strength symmetric.      24 hour intake/output:  Intake/Output Summary (Last 24 hours) at 08/24/2021 1049  Last data filed at 08/23/2021 1500  Gross per 24 hour   Intake 240 ml   Output 425 ml   Net -185 ml     Last 3 weights:  Wt Readings from Last 3 Encounters:   08/22/21 256 lb 4.8 oz (116.3 kg)   08/16/21 256 lb (116.1 kg)   08/16/21 256 lb (116.1 kg)         CBC:   Recent Labs     08/22/21  1236  08/23/21  1145   WBC 6.9 7.3   HGB 11.7* 11.2*   PLT 375 311     BMP:    Recent Labs     08/22/21  1236 08/23/21  1145   NA 137 139   K 4.2 4.5   CL 104 105   CO2 22* 21*   BUN 22 15   CREATININE 1.2 1.2   GLUCOSE 188* 143*     Calcium:  Recent Labs     08/23/21  1145   CALCIUM 8.9     Magnesium:No results for input(s): MG in the last 72 hours.  Glucose:  Recent Labs     08/23/21  1737 08/23/21  2014 08/24/21  0741   POCGLU 206* 139* 124*     HgbA1C: No results for input(s): LABA1C in the last 72 hours.  Lipids: No  results for input(s): CHOL, TRIG, HDL, LDL, LDLCALC in the last 72 hours.    Radiology reports as per the Radiologist  Radiology: CT HEAD W WO CONTRAST    Result Date: 08/22/2021  PROCEDURE: CT HEAD W WO CONTRAST CLINICAL INFORMATION: Cranial wound deshiscense. Discharge at the craniowound. COMPARISON: MR brain dated 08/02/2021 and CT head dated the 15th 2023. TECHNIQUE: Helical CT of the head before and after intravenous administration of 75 mL Isovue-370 injected in the right wrist with axial, sagittal and coronal reconstructions. All CT scans at this facility use dose modulation, iterative reconstruction, and/or weight-based dosing when appropriate to reduce radiation dose to as low as reasonably achievable. FINDINGS: Redemonstration of a left frontal parietal craniotomy with subjacent left frontal resection cavity. There is hypodense fluid within the resection cavity and small foci of air. Postsurgical air at the frontal convexity has resolved in the interval. There is dural thickening and enhancement subjacent to the craniotomy plate. No definite focal fluid collection is present in the scalp overlying the craniotomy plate. Redemonstration of right parietal occipital craniotomy with underlying resection cavity, similar to prior exam. There is dural thickening and enhancement subjacent the craniotomy plate, similar to prior exam. Postsurgical air subjacent to the plate has resolved in the interval. No  focal areas of nodular enhancement are present at the resection cavities. There is a moderate-sized area of encephalomalacia in the right frontal lobe, similar to prior exams. Ventricular size is similar to prior exams. Orbits are unremarkable. There is a stable mucous retention cyst or mucocele in right frontal sinus. Paranasal sinuses and mastoid air cells are otherwise clear.      1. Redemonstration of left frontal parietal craniotomy with underlying resection cavity. Postsurgical pneumocephalus at the convexities has resolved but there is persistent fluid and foci of air within the resection cavity with thickening and enhancement  of the dura overlying the cavity and subjacent to the craniotomy plate. No definite empyema or cerebritis is identified based on this exam although can't definitely be excluded. 2. Redemonstration of right parietal occipital craniotomy with underlying resection cavity with interval resolution of postsurgical air subjacent to the craniotomy plate. 3. 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 **This report has been created using voice recognition software. It may contain minor errors which are inherent in voice recognition technology.** Final report electronically signed by Dr. Adrienne Mocha, MD on 08/22/2021 3:47 PM                   Assessment:    Principal Problem:    Wound dehiscence  Active Problems:    Type 2 diabetes mellitus without complication, without long-term current use of insulin ([place])    Primary hypertension    Hyperlipidemia  Resolved Problems:    * No resolved hospital problems. *    Patient with above history was evaluated.  After careful evaluation of the imaging and clinical elements the patient is post op day #2.  No changes in neurological status have been recorded.  The patient is stable and with small amount of pain in the wound area.  Dressings were dry and no signs of infection were seen.  Will follow ID recommendations and patient can be discharged  from the neurosurgical standpoint.  Patient's wife and patient were oriented of the plan and no questions were present at the closure of the interview.       Plan:  Agree with home antibiotics plan.  Will follow up in clinics in  2 weeks for wound check and suture removal.  Patient may be discharged from the neurosurgical point of view.   From this time on, neurosurgery team will see this patient only as needed as long as she/he is in the hospital. Please call if you have any further questions or concerns regarding this patient.      Electronically signed by Darrol Angel, MD on 08/24/2021 at 10:49 AM    Neurosurgery

## 2021-08-25 LAB — CULTURE, ANAEROBIC AND AEROBIC

## 2021-09-06 ENCOUNTER — Inpatient Hospital Stay: Admit: 2021-09-06 | Payer: MEDICAID | Attending: Family | Primary: Family Medicine

## 2021-09-06 DIAGNOSIS — T8141XA Infection following a procedure, superficial incisional surgical site, initial encounter: Secondary | ICD-10-CM

## 2021-09-06 NOTE — Progress Notes (Signed)
[name]  Consult and Procedure Note      Marcus Burnett  MEDICAL RECORD NUMBER:  [id number]  AGE: 61 y.o.   GENDER: male  DOB: [birth year]  EPISODE DATE:  09/06/2021    SUBJECTIVE:     Chief Complaint   Patient presents with    Wound Check     Scalp/ antibiotics         HISTORY OF PRESENT ILLNESS      Marcus Burnett is a 61 y.o. male who presents today for infectious disease hospital follow up s/p I &D for cranial wound dehiscence, culture +MSSA.  He was evaluated by Dr. Deeann Cree while in hospital, was discharged with Keflex x10 days.  Patient states he tolerated antibiotics well, no adverse effects noted.  He states that incision has healed well, he has been applying triple antibiotic ointment to incision line.  He denies any drainage from incision.  Reports intermittent headache to surgical area.  States he is scheduled for MRI 09/10/21 with follow up with Dr. Jonne Ply 09/14/21.  He denies any fevers or chills.  Denies any further needs or concerns.      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 performed by Illene Labrador, MD at [facility] Left 07/31/2021    Left Craniotomy Resection of Tumor performed by Illene Labrador, MD at [facility] N/A 08/22/2021    I & D AND CLOSURE FOR LEFT CRANIAL WOUND DEHISENCE performed by Darrol Angel, MD at [facility]     Family History   Problem Relation Age of Onset    Kidney Disease Father     Heart Disease Father     Diabetes Father        SOCIAL HISTORY     Social History     Tobacco Use    Smoking status: Never     Passive exposure: Never    Smokeless tobacco: Never   Vaping Use    Vaping Use: Never used   Substance Use Topics    Alcohol use:  Never    Drug use: Never       ALLERGIES     No Known Allergies    MEDICATIONS     Current Outpatient Medications on File Prior to Encounter   Medication Sig Dispense Refill    levETIRAcetam (KEPPRA) 500 MG tablet Take 1 tablet by mouth 2 times daily 60 tablet 0    TRULICITY 1.5 FY/1.0FB SOPN INJECT 0.5 ML (1.'5MG'$  DOSE) INTO THE SKIN EVERY 7 DAYS      metFORMIN (GLUCOPHAGE) 500 MG tablet TAKE 2 TABLETS (1,000 MG TOTAL) BY MOUTH 2 TIMES DAILY WITH MEALS.      glipiZIDE (GLUCOTROL XL) 10 MG extended release tablet TAKE 1 TABLET BY MOUTH EVERY DAY      lisinopril (PRINIVIL;ZESTRIL) 40 MG tablet TAKE 1 TABLET BY MOUTH EVERY DAY      pravastatin (PRAVACHOL) 80 MG tablet TAKE 1 TABLET BY MOUTH  EVERY DAY       No current facility-administered medications on file prior to encounter.       REVIEW OF SYSTEMS:     Comprehensive ROS completed, pertinent items per HPI    PHYSICAL EXAM:     BP 133/82    Pulse 72    Temp 98.1 ??F (36.7 ??C) (Infrared)    Resp 16    SpO2 95%   Wt Readings from Last 3 Encounters:   08/22/21 256 lb 4.8 oz (116.3 kg)   08/16/21 256 lb (116.1 kg)   08/16/21 256 lb (116.1 kg)       General:  Awake, alert, no apparent distress.  Appears stated age  41: conjuctivae are clear without exudate or hemorrhage, anicteric sclera, moist oral mucosa.  Chest:  Respirations regular, non-labored.  Chest rise and fall equal bilaterally.  Abdomen:  Soft, non tender to palpation.  Neurological: Awake, alert, oriented x4  Psychiatric:  Appropriate mood and affect  Extremities: non-traumatic in appearance.    Skin:  Warm and dry  Wound:    Surgical incision appears well healed-edges well approximated, sutures intact, no open wound, drainage, erythema, fluctuance, or warmth to the touch noted.            LABS/IMAGING     Wound Culture:   Culture also yielded light growth of Staphylococcus (coagulase negative). Abnormal     Anaerobic Culture Culture yielded light growth of anaerobic gram positive cocci. If a true mixed  aerobic and anaerobic infection is suspected, then broad spectrum empiric antibiotic therapy is indicated and should include coverage for anaerobic organisms.   Gram Stain Result Rare segmented neutrophils observed. Rare epithelial cells observed. Rare gram positive cocci occurring singly and in pairs.   Organism Staphylococcus aureus Abnormal     Aerobic Culture light growth In the treatment of gram positive infections, GENTAMICIN should be CONSIDERED a SYNERGYSTIC agent ONLY. Ciprofloxacin and Levofloxacin, regardless of in vitro sensitivity, should not be used for staphylococcal infections other than uncomplicated lower UTIs. Moxifloxacin, regardless of in vitro sensitivity, should not be used for staphylococcal infections.    Resulting Agency [facility]        Susceptibility    Staphylococcus aureus (1)    Antibiotic Interpretation Microscan  Method Status    gentamicin Sensitive <=0.5 mcg/mL BACTERIAL SUSCEPTIBILITY PANEL BY MIC Final    ICR (D test) Positive Pos  mcg/mL BACTERIAL SUSCEPTIBILITY PANEL BY MIC Final     (Dtest) ICR- inducible clinda resistance    If +, then inducible erm gene       present. Clindamycin may be       effective in some patients.            oxacillin Sensitive <=0.25 mcg/mL BACTERIAL SUSCEPTIBILITY PANEL BY MIC Final    clindamycin Resistant <=0.25 mcg/mL BACTERIAL SUSCEPTIBILITY PANEL BY MIC Final    trimethoprim-sulfamethoxazole Sensitive <=10 mcg/mL BACTERIAL SUSCEPTIBILITY PANEL BY MIC Final    tetracycline Sensitive <=1 mcg/mL BACTERIAL SUSCEPTIBILITY PANEL BY MIC Final       Narrative  Performed by: [facility]  Source: head       Site: swab left cranial          Current Antibiotics: not stated      Specimen Collected: 08/22/21 17:15 EST Last Resulted: 08/25/21 14:24 EST              ASSESSMENT     -Superficial surgical  site infection, MSSA     Patient Active Problem List   Diagnosis Code    Neoplasm of brain causing mass effect on adjacent  structures ([place]) D49.6, G93.5    Altered mental status R41.82    History of cancer metastatic to brain Z85.89    Renal cell carcinoma ([facility]) C64.9    Goals of care, counseling/discussion Z71.89    Wound dehiscence T81.30XA    Type 2 diabetes mellitus without complication, without long-term current use of insulin ([place]) E11.9    Primary hypertension I10    Hyperlipidemia E78.5     PLAN     Patient examined and evaluated.    All available lab work, radiology studies, and progress notes pertaining to [name] reviewed prior to or during patient visit today.    -Superficial surgical site infection, MSSA- Patient has completed entire course of antibiotics with no adverse effects noted.  Surgical site is well healed today,no indications of infectious process.  Discussed obtaining labs to evaluate for infectious process, patient declined.  No further antibiotics indicated at this time.    No indications of infection noted at today's visit.  Education provided on signs and symptoms of infection.  Call clinic or seek emergency care should these occur.  Encouraged him to follow up with Dr. Jonne Ply as scheduled for ongoing care and removal of sutures    Follow up as needed.  Call clinic with any needs or concerns prior to scheduled visit.    All questions and concerns addressed prior to discharge from today's visit.    Please see attached Discharge Instructions    Written patient dismissal instructions given to patient and signed by patient or POA.           Discharge Instructions         Discharge Instructions         Visit Discharge/Physician Orders: no need for antibiotics at this time  - monitor the area for redness,warmth, drainage, pain and call the office if any of those occur  - stop using ointment to the area  - ok to use antibacterial soap to the area  - keep appointment with MRI and neurology        Wound Location: scalp        Follow up visit:   as needed      Keep next scheduled appointment. Please give 24 hour  notice if unable to keep appointment. [phone]    If you experience any of the following, please call the Wound Care Service during business hours: Monday through Friday 8:00 am - 4:30 pm  ([phone]).   *Increase in pain   *Temperature over 101   *Increase in drainage from your wound or a foul odor   *Uncontrolled swelling   *Need for compression bandage changes due to slippage, breakthrough drainage    If you need medical attention outside of business hours, please contact your Primary Care Doctor or go to the nearest emergency room.                  Electronically signed by Kathrine Cords Norinne Jeane, APRN - CNP on 09/06/2021 at 9:59 AM

## 2021-09-06 NOTE — Plan of Care (Signed)
Problem: Skin/Tissue Integrity  Goal: Absence of new skin breakdown  Description: 1.  Monitor for areas of redness and/or skin breakdown  2.  Assess vascular access sites hourly  3.  Every 4-6 hours minimum:  Change oxygen saturation probe site  4.  Every 4-6 hours:  If on nasal continuous positive airway pressure, respiratory therapy assess nares and determine need for appliance change or resting period.  Outcome: Adequate for Discharge   Pt. Seen today for scalp incision and antibiotic therapy see AVS for orders. Follow up as needed.  Care plan reviewed with patient.  Patient verbalize understanding of the plan of care and contribute to goal setting.

## 2021-09-06 NOTE — Discharge Instructions (Signed)
Visit Discharge/Physician Orders: no need for antibiotics at this time  - monitor the area for redness,warmth, drainage, pain and call the office if any of those occur  - stop using ointment to the area  - ok to use antibacterial soap to the area  - keep appointment with MRI and neurology        Wound Location: scalp        Follow up visit:   as needed      Keep next scheduled appointment. Please give 24 hour notice if unable to keep appointment. [phone]    If you experience any of the following, please call the Wound Care Service during business hours: Monday through Friday 8:00 am - 4:30 pm  [phone]).   *Increase in pain   *Temperature over 101   *Increase in drainage from your wound or a foul odor   *Uncontrolled swelling   *Need for compression bandage changes due to slippage, breakthrough drainage    If you need medical attention outside of business hours, please contact your Primary Care Doctor or go to the nearest emergency room.

## 2021-09-10 ENCOUNTER — Ambulatory Visit: Payer: MEDICAID | Primary: Family Medicine

## 2021-09-10 DIAGNOSIS — C7931 Secondary malignant neoplasm of brain: Secondary | ICD-10-CM

## 2021-09-10 MED ORDER — GADOTERIDOL 279.3 MG/ML IV SOLN
279.3 MG/ML | Freq: Once | INTRAVENOUS | Status: AC | PRN
Start: 2021-09-10 — End: 2021-09-10
  Administered 2021-09-10: 15:00:00 20 mL via INTRAVENOUS

## 2021-09-12 ENCOUNTER — Ambulatory Visit: Admit: 2021-09-12 | Discharge: 2021-09-12 | Payer: MEDICAID | Attending: Neurological Surgery | Primary: Family Medicine

## 2021-09-12 DIAGNOSIS — C7931 Secondary malignant neoplasm of brain: Secondary | ICD-10-CM

## 2021-09-12 NOTE — Addendum Note (Signed)
Addended by: Redmond Pulling C on: 09/12/2021 03:53 PM     Modules accepted: Orders

## 2021-09-12 NOTE — Progress Notes (Addendum)
[address]  [place] [address]  Dept: [phone]  Dept Fax: [phone]  Loc: [facility] Follow Visit  Visit Date: 09/12/2021      Marcus Burnett  is a 61 y.o. male who is returning to the office today for a post-op follow-up visit. He had surgery on 08/12/2021 with Dr. Gardenia Phlegm where he underwent left frontal craniotomy and surgical resection of left frontal intraparenchymal metastatic tumor.  An additional procedure performed by Dr. Payton Mccallum on 08/22/2021 for I&D and closure of left cranial wound dehiscence is noted.  Today's appointment is his initial postdischarge hospital follow-up from that procedure principally for wound inspection.  He arrives today accompanied by his wife and in good spirits.  He presents with clear appropriate speech and participated in discussions involving his care appropriately with purposeful movement noted based commands appropriately and was without significant neuro-deficits.  Sutures were removed from latest procedure with a well-healed incision beneath.  Some bacitracin was applied to a few of the suture hole locations with instructions to keep those areas clean and dry for the next 24 hours prior to showering.  MRI of the brain imaged on 09/10/2021 reveals interval improvement since previous study of 08/02/2021 with postoperative changes noted in 70 x 40 mm ring-enhancing cavity lesion left lower lobe noted a smaller than on the previous study with less surrounding edema no new enhancing lesions were noted. Discussions were had about treatment moving forward and about their decision not to undergo radiation or medical oncology including stereotactic radio treatment.  With that in mind, we have agreed to follow-up with them in 4 weeks with a new MRI of the brain to be imaged with and without contrast as a comparison to the most recent imaging to rule out recurrence.  Having  recently completed PT, OT, and speech therapy, and had been cleared by other services, he is anxious to return to activities of daily living including driving.  Dr. Gardenia Phlegm has advised against driving due to his history and recent surgery. He and his wife are very happy with the surgery and with recovery and with today's appointment having questions concerns addressed and answered as they look forward to their next appointment with our service in 4 weeks which will include an MRI brain without contrast.     Patient was evaluated today and is doing very well overall.  No new complaints were voiced.  Patient  lives with their spouse  Wound: wound healing as expected  Follow-up Studies: No orders of the defined types were placed in this encounter.       Assessment/Plan:  Status Post craniotomy follow-up and wound dehiscence follow-up.  Doing very well overall  Encouraged gradual increase in physical and mental activity.  Fall precaution and home safety education provided to patient.  Follow-up: 4-week follow-up with new MRI of the brain to be imaged with and without contrast as a comparison earlier imaging to rule out recurrence      Electronically signed by Brayton El, PA-C on 09/12/21 at 3:03 PM EDT     Attending Note:     Patient was seen and examined by me in conjunction with neurosurgery PA/ANP.  Discussed with patient and his wife .  All data and imaging reviewed by myself. I agree with examination assessment and plan as documented above.     -Patient is doing well in general.  -His wound  is well-healed.  -I discussed with the patient today specifically the driving concern.  I advised the patient not to drive given his history of recent brain surgery, history of metastatic brain disease and his high risk of seizure.  I advised the patient also to review the law requirement in this regard.  -All questions and concerns were addressed and answered.       Illene Labrador, MD

## 2021-09-25 ENCOUNTER — Encounter: Payer: MEDICAID | Primary: Family Medicine

## 2021-09-26 ENCOUNTER — Inpatient Hospital Stay: Admit: 2021-09-26 | Payer: MEDICAID | Primary: Family Medicine

## 2021-09-26 DIAGNOSIS — R918 Other nonspecific abnormal finding of lung field: Secondary | ICD-10-CM

## 2021-09-26 LAB — POCT CREATININE: POC CREATININE WHOLE BLOOD: 1.7 mg/dl — ABNORMAL HIGH (ref 0.5–1.2)

## 2021-09-26 MED ORDER — IOPAMIDOL 76 % IV SOLN
76 % | Freq: Once | INTRAVENOUS | Status: AC | PRN
Start: 2021-09-26 — End: 2021-09-26
  Administered 2021-09-26: 14:00:00 85 mL via INTRAVENOUS

## 2021-09-29 LAB — CREATININE CLEARANCE
Creatinine: 1.3 mg/dL (ref 0.80–1.40)
EGFR IF NonAfrican American: 62 mL/min/{1.73_m2} (ref >60)

## 2021-10-02 ENCOUNTER — Ambulatory Visit: Admit: 2021-10-02 | Discharge: 2021-10-02 | Payer: MEDICAID | Attending: Registered Nurse | Primary: Family Medicine

## 2021-10-02 DIAGNOSIS — C642 Malignant neoplasm of left kidney, except renal pelvis: Secondary | ICD-10-CM

## 2021-10-02 NOTE — Progress Notes (Signed)
[address]  [place] [address]  Dept: [phone]  Loc: [phone]    Visit Date: 10/02/2021        HPI:     Marcus Burnett is a 61 y.o. male who presents today for:  Chief Complaint   Patient presents with    Results     Review ct results     Other     Renal cell carcinoma       HPI  Pt seen in follow up for RCC.     [name] has a hx of RCC s/p RAL left radical nephrectomy by Dr. Lovena Neighbours in [facility] 11/08/20.  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  Tumor extended into the renal vein, perirenal and renal sinus fat but not beyond Gerota's fascia.  LN neg.  [id number]    At telephone visit 02/2021 referral to medical oncology for discussion of adjuvant therapy was discussed and pt declined.      In February of this year pt was found to have increase in size of lung nodules, enhancing lesions of the liver suggestive of metastases, sclerotic lesion in L2 vertebral body and brain lesions.  Pt underwent craniotomy by Dr. Jonne Ply with pathology showing metastatic clear cell RCC.  Pt declined radiation therapy.  He met with medical oncology and at that time declined therapy as well.      Pt denies any urinary complaints today.  Denies fever/chills.  Denies gross hematuria.    Current Outpatient Medications   Medication Sig Dispense Refill    naproxen sodium (ANAPROX) 220 MG tablet Take 1-2 tablets by mouth 2 times daily as needed      TRULICITY 1.5 GQ/6.7YP SOPN INJECT 0.5 ML (1.5MG DOSE) INTO THE SKIN EVERY 7 DAYS      metFORMIN (GLUCOPHAGE) 500 MG tablet TAKE 2 TABLETS (1,000 MG TOTAL) BY MOUTH 2 TIMES DAILY WITH MEALS.      glipiZIDE (GLUCOTROL XL) 10 MG extended release tablet TAKE 1 TABLET BY MOUTH EVERY DAY      lisinopril (PRINIVIL;ZESTRIL) 40 MG tablet TAKE 1 TABLET BY MOUTH EVERY DAY      pravastatin (PRAVACHOL) 80 MG tablet TAKE 1 TABLET BY MOUTH EVERY DAY      levETIRAcetam (KEPPRA)  500 MG tablet Take 1 tablet by mouth 2 times daily 60 tablet 0     No current facility-administered medications for this visit.       Past Medical History  Ryatt  has a past medical history of Cancer ([place]), Diabetes mellitus ([place]), and Hypertension.    Past Surgical History  The patient  has a past surgical history that includes craniotomy (Right, 07/26/2021); craniotomy (Left, 07/31/2021); Kidney removal; and craniotomy (N/A, 08/22/2021).    Family History  This patient's family history includes Diabetes in his father; Heart Disease in his father; Kidney Disease in his father.    Social History  [name]  reports that he has never smoked. He has never been exposed to tobacco smoke. He has never used smokeless tobacco. He reports that he does not drink alcohol and does not use drugs.      Subjective:      Review of Systems   Constitutional:  Negative for activity change, appetite change, chills, diaphoresis, fatigue, fever and unexpected weight change.   Gastrointestinal:  Negative for abdominal pain, nausea and vomiting.   Genitourinary:  Negative for decreased urine volume, difficulty urinating, dysuria, flank pain, frequency, hematuria and urgency.   Musculoskeletal:  Negative for back pain.     Objective:   BP 112/72   Ht '5\' 10"'  (1.778 m)   Wt 259 lb (117.5 kg)   BMI 37.16 kg/m     Physical Exam  Vitals reviewed.   Constitutional:       General: He is not in acute distress.     Appearance: Normal appearance. He is well-developed. He is not ill-appearing or diaphoretic.   HENT:      Head:      Comments: Pt wearing handkerchief over head.     Right Ear: External ear normal.      Left Ear: External ear normal.      Nose: Nose normal.      Mouth/Throat:      Mouth: Mucous membranes are moist.   Eyes:      General: No scleral icterus.        Right eye: No discharge.         Left eye: No discharge.   Neck:      Vascular: No JVD.      Trachea: No tracheal deviation.   Cardiovascular:      Rate and Rhythm: Normal  rate and regular rhythm.   Pulmonary:      Effort: Pulmonary effort is normal. No respiratory distress.      Breath sounds: Normal breath sounds.   Abdominal:      General: There is no distension.      Tenderness: There is no abdominal tenderness. There is no right CVA tenderness or left CVA tenderness.   Musculoskeletal:         General: Normal range of motion.   Skin:     General: Skin is warm and dry.   Neurological:      Mental Status: He is alert and oriented to person, place, and time. Mental status is at baseline.   Psychiatric:         Mood and Affect: Mood normal.         Behavior: Behavior normal.         Thought Content: Thought content normal.       POC  No results found for this visit on 10/02/21.      Patients recent PSA values are as follows  Lab Results   Component Value Date    PSA 1.02 03/20/2021        Recent BUN/Creatinine:  Lab Results   Component Value Date/Time    BUN 15 08/23/2021 11:45 AM    CREATININE 1.30 09/28/2021 10:37 AM     Radiology  The patient has had a CT abd/pelvis and chest which I have independently reviewed along with its accompanying report.  The study demonstrates results as below:   PROCEDURE: CT ABDOMEN PELVIS W IV CONTRAST, CT CHEST W CONTRAST       CLINICAL INFORMATION: Renal cell carcinoma.       COMPARISON: CT chest, abdomen, and pelvis 07/25/2021.       TECHNIQUE: Axial 5 mm CT images were obtained through the chest, abdomen and pelvis after the administration of 80  cc Isovue 370 intravenous contrast. Coronal and sagittal reconstructions were obtained.       All CT scans at this facility use dose modulation, iterative reconstruction, and/or weight-based dosing when appropriate to reduce radiation dose to as low as reasonably achievable.       FINDINGS:  Heart/mediastinum: The thyroid gland is unremarkable. The heart size is normal. No pericardial effusion is observed. No aortic aneurysm or dissection is visualized. No mediastinal, hilar, or axillary  lymphadenopathy is observed.       Lungs: A 5 mm right lower lobe pulmonary nodule is stable (series 3, image 29). A 4 mm right lower lobe pulmonary nodule is stable (series 3, image 45). A 4 mm right perihilar nodule is unchanged (series 3, image 25). The 2 mm right middle lobe pulmonary    nodules are stable (series 3, image 33). 3 mm and 4 mm left lower lobe pulmonary nodules appear unchanged (series 3, image 40). No new or enlarging nodule is identified. No focal consolidation, pleural effusion, or pneumothorax is observed.       Liver/gallbladder/bilary tree: Hepatomegaly and hepatic steatosis are stable. No liver lesions are observed. No radiopaque gallstones or biliary ductal dilatation is identified.       Pancreas: Normal.   Spleen : Normal.   Adrenal glands: The left adrenal gland is not visualized. The right adrenal gland is unremarkable.       Kidneys/ ureters/ bladder: Postoperative changes related to left nephrectomy are stable. A 3 mm nonobstructing calculus in the upper pole of the right kidney is unchanged. Subcentimeter hypoattenuating lesion in the midpole the right kidney is stable. No    hydronephrosis or hydroureter is observed. The urinary bladder is partially distended and grossly unremarkable.       Gastrointestinal:  The appendix is normal. Colonic diverticulosis without diverticulitis is observed. A small sliding-type hiatal hernia is unchanged. No bowel obstruction, free fluid, fluid collection, or free air is visualized. Small bilateral    fat-containing inguinal hernias are stable.       Retroperitoneum / lymph nodes: The aorta is not dilated. No lymphadenopathy is visualized.       Pelvis: The prostate gland is mildly enlarged measuring 3.5 x 4.9 cm.       Musculoskeletal: The visualized skeletal structures appear intact. Degenerative disc disease is noted in the thoracic and lumbar spine.           Impression   1. Small bilateral pulmonary nodules appear unchanged. No obviously new or  enlarging pulmonary mass or nodule is observed. No acute intrathoracic process is visualized. Postoperative changes related to left nephrectomy are stable. No metastatic disease    is observed.            2. Stable chronic findings are discussed.       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  Discussed with metastatic RCC he needs to consider systemic therapy and his disease is best managed at this juncture by medical oncology.  He is agreeable with referral and reports he will see them.  Office to facilitate.  Further imaging per medical oncology recommendations.      Above discussed and reviewed with Dr. Humphrey Rolls.     Pt to f/u with urology in 1 year.

## 2021-10-03 ENCOUNTER — Encounter

## 2021-10-04 ENCOUNTER — Inpatient Hospital Stay: Admit: 2021-10-04 | Payer: MEDICAID | Primary: Family Medicine

## 2021-10-04 DIAGNOSIS — C7931 Secondary malignant neoplasm of brain: Secondary | ICD-10-CM

## 2021-10-04 NOTE — Discharge Summary (Signed)
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  Underwent crainiotomy for tumor in February 2023.  Ready to return to driving.    PMH: Please see medical history questionnaire for past medical history, allergies, and medications.  PATIENT GOALS:    return to driving, return to some type of employment    OBJECTIVE  VISUAL SKILLS(using the OPT[employer] 2000 Visual Evaluator)      FAR VISUAL ACUITY:   Pass.  20/30 R and L     STEREO DEPTH:   Pass.        FUSION:    Pass.        PERIPHERAL VISION:  Pass.  3/3 identified bilaterally at 55, 70 and 85 degree peripheral angles.       DYNAVISION TESTING:  51 hits in 60 second self paced trial.  Considered [facility] for driving.    PHYSICAL SKILLS  RANGE OF MOTION:Within functional limits for driving  STRENGTH: Within functional limits for driving  TRANSFER IN/OUT OF SIMULATOR: Within functional limits for driving  AMBULATION: Within functional limits for driving    IN VEHICLE MANIPULATION SKILLS:  Steering Wheel:Within functional limits for driving   Gas Pedal:  Within functional limits for driving  Brake Pedal: Within functional limits for driving  Turn Signal: Within functional limits for driving  Seat Belt:Within functional limits for driving     COGNITIVE SKILLS  ORIENTATION:  Within functional limits for driving  ATTENTION SPAN:Within functional limits for driving  FRUSTRATION TOLERANCE:Within functional limits for driving  IMPULSIVITY:Within functional limits for driving  DIRECTION FOLLOWING:Within functional limits for driving  R/L DISCRIMINATION:Within functional limits for driving  MEMORY:Within functional limits for driving  JUDGMENT: Within functional limits for  driving    COGNITIVE TESTING:  Completed MOC[facility] Cognitive Assessment) which addresses the patient's cognitive function in the areas of visuospatial/executive function, naming, memory, attention, language, abstraction, delayed recall, and orientation.  The patient scored 28 /30 indicating normal cognition.      SIMULATED DRIVING ASSESSMENT:  WARM-UP:    (55 MPH, 2 lane highway with several curves. Light on-coming traffic. There are no intersections, pedestrians, cross traffic, slow traffic, etc.)    Total off road crashes: 0 (Good performance is 0)   Total collisions with vehicles and roadway objects: 0 (Good performance is 0)   Percentage of time over the posted speed limit: 0% (Good performance is within +/- 5% of the posted speed limit.)   Percentage of time out of lanes: 3% (Good performance would be less than 5%, moderate is less than 10%, greater than 10% is considered poor.)      BRAKE REACTION TIME:  (The brake reaction time test consists of presenting a large stop sign in the center of the visual display. The appropriate response if for the driver to release the gas and apply the brakes as quickly as possible.)     Total pedal reaction time: 0.7 seconds (Average value is below 0.7 seconds.)   Gas pedal reaction time: 0.4 seconds (For good performance, this should be less than 0.4 seconds; poor performance is above 0.55 seconds)   Stopping distance: 155 feet (Good performance is less than 175 feet, moderate  is between 175 and 220 feet, greater than 220 feet is considered poor.)   Speed at stimulus: 48 MPH      SIMULATED DRIVING TREATMENT:  WIDE FIELD DIVIDED ATTENTION:  (In this task, the driver will navigate a two lane, six mile highway with both curves and hills. There will be no obstacles in front of the driver but there will be on-coming traffic. Divided Attention (DA) events are presented, however they will only be presented on the side monitors thus forcing the driver to scan the entire scene,  not just the center monitor. The DA events include only left and right arrows. The driver will be presented with sixteen different DA events. The DA events will appear on the outside half of the left and right monitors with eight symbols being shown in the upper quadrant and four will be shown in the lower quadrant. The driver will also need to maintain their speed and lane position during the drive.)     Total off road crashes: 0 (Good performance is 0)   Total collisions with vehicles and roadway objects: 0 (Good performance is 0)   Percentage of time over the posted speed limit: 0.8% (Good performance would be less than 5%, moderate would be less than 10% and anything greater than 10% would be poor)   Percentage of time out of lanes: 2.5% (Good performance would be less than 1%, moderate would be less than 2.5% and anything greater than 2.5% would be poor)   Total correct attention responses: 14/16. (Good performance is all symbols responded to correctly, moderate performance would be missing 1-2.)   Number of upper left symbols missed: 2/4   Number of lower left symbols missed: 0/4   Number of upper right symbols missed: 0/4   Number of lower right symbols missed: 0/4   Average speed: 92 MPH (Good performance is +/- 5 MPH of the posted speed limit. Average speed on this drive is 50 MPH.)       BASIC VEHICLE CONTROL:  (Two-lane, 40 MPH road. There are 4-way stops with minimal cross traffic and signalized intersections with lights that change from green to red. There are some pedestrians at the final intersection.)   Total number of off road crashes: 0 (Good performance is 0.)   Total number of collisions with vehicles and other roadway objects: 0 (Good performance is 0.)   Total number of traffic light tickets: 0 (Good performance is 0.)   Total number of stop sign tickets: 0 (Good performance is 0.)   Percentage of time over the posted speed limit: 0% (Good performance would be less than 5%, moderate would be  less than 10% and anything greater than 10% would be poor)   Percentage of time out of lanes: 1.2% (Good performance would be less than 1%, moderate would be less than 2.5% and anything greater than 2.5% would be poor)   Number of correctly negotiated intersections: 5   Number of incorrectly negotiated intersections: 0 (Good performance is 0.)   Overall turn signal usage: Good (Good means driver used their turn signals properly more than 75% of the time; moderate means driver used their turn signals properly between 25-75% of the time, poor means driver used their turn signals properly less than 25% of the time)    SUBURBAN DRIVE:  (Two-lane residential road and school zones separated by curved roadway sections. Hazards include: pedestrians, cross traffic not stopping at unmarked intersections, and vehicles backing out of drives.)  Total number of off road crashes: 0 (Good performance is 0.)   Total number of collisions with vehicles and other roadway objects: 0 (Good performance is 0.)   Total number of traffic light tickets: 0 (Good performance is 0.)   Total number of stop sign tickets: 0 (Good performance is 0.)   Percentage of time over the posted speed limit: 0% (Good performance would be less than 5%, moderate would be less than 10% and anything greater than 10% would be poor)   Collision with backing vehicle? No   Total pedal reaction time to backing vehicle: 3.2 seconds  (Average value is below 0.7 seconds)   Collision with pedestrian or animal? No   Total pedal reaction time to pedestrian or animal: 3.2 seconds (Average value is below 0.7 seconds)   Did the driver exceed the posted speed limit in school zone? No         ASSESSMENT AND PLAN    RESULTS: Patient tested as safe to return to independent driving    RECOMMENDATIONS:     Patient has been instructed to contact referring physician to discuss results of this evaluation. Patient has been informed that his or her physician is responsible for making  the final decision regarding driving status. Thank you for this referral.      Time in: 0930  Time out: 1040  Timed treatment: 0 minutes  Total time: 70 minutes          Roe Rutherford OTR/L, [place]

## 2021-10-09 ENCOUNTER — Encounter: Payer: MEDICAID | Attending: Hematology & Oncology | Primary: Family Medicine

## 2021-10-11 ENCOUNTER — Ambulatory Visit: Payer: MEDICAID | Primary: Family Medicine

## 2021-10-11 DIAGNOSIS — C7931 Secondary malignant neoplasm of brain: Secondary | ICD-10-CM

## 2021-10-11 MED ORDER — GADOTERIDOL 279.3 MG/ML IV SOLN
279.3 MG/ML | Freq: Once | INTRAVENOUS | Status: AC | PRN
Start: 2021-10-11 — End: 2021-10-11
  Administered 2021-10-11: 15:00:00 20 mL via INTRAVENOUS

## 2021-10-12 ENCOUNTER — Ambulatory Visit: Admit: 2021-10-12 | Discharge: 2021-10-12 | Payer: MEDICAID | Attending: Medical Oncology | Primary: Family Medicine

## 2021-10-12 ENCOUNTER — Inpatient Hospital Stay: Admit: 2021-10-12 | Payer: MEDICAID | Primary: Family Medicine

## 2021-10-12 DIAGNOSIS — C642 Malignant neoplasm of left kidney, except renal pelvis: Secondary | ICD-10-CM

## 2021-10-12 NOTE — Patient Instructions (Addendum)
Please send brain specimen for Foundation 1 CDx (tissue)    F/U 3-4 weeks    Start pembrolizumab in 2 weeks 5/12    Teaching for pembrolizumab

## 2021-10-12 NOTE — Progress Notes (Signed)
See Physician's progress note.
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
## 2021-10-12 NOTE — Progress Notes (Signed)
Name: Breyon Sigg  DOB: 1960-10-04  MRN: [id number]    Oncology Navigation Follow-Up Note    Contact Type:  Medical Oncology    Subjective: Pt meeting with an alternate ONC at his request.    Objective:     ONC POC:   -MRI Brain  10/11/21 reviewed with pt & spouse, reflective of disease progression  -Extensive conversation re: standard of care with Fu brain XRT & Immunotherapy.  ONC addressed pt/spousal questions.  -[facility] requested- email to [facility] [place] reviewed all options for care:  #1- do nothing, as he has been currently opting to do at this time.  #2 XRT to Brain & Systemic tx with immunotherapy- standard of care->pt is adamantly opposed to brain XRT.   Or #3 Systemic tx alone with Immunotherapy-->pt receptive.  -ONC discussed immunotherapy effect  -POC entered  for prior authorization  -Chemo teaching ordered  -Return 5/12 to see C-NP & initiate immunotherapy tx     Assistance Needed: denies    Receptive to Advanced Care Planning / Palliative Care:  deferred    Referrals: N/A    Education: POC reiterated with pt & spouse    Notes: Nav following to assist as needed.    Electronically signed by Neysa Bonito, RN on 10/12/2021 at 2:46 PM

## 2021-10-17 ENCOUNTER — Inpatient Hospital Stay: Admit: 2021-10-17 | Payer: MEDICAID | Primary: Family Medicine

## 2021-10-17 DIAGNOSIS — C642 Malignant neoplasm of left kidney, except renal pelvis: Secondary | ICD-10-CM

## 2021-10-17 NOTE — Progress Notes (Signed)
Chemotherapy/Immunotherapy Teaching Checklist    Treatment Plan: Beryle Flock   Frequency: every 3 weeks  Patient accompanied by  wife waited in lobby per his request      Day of chemo instructions:  '[x]'$  Must have driver   '[x]'$  Eat light breakfast  '[x]'$  Bring pain medications/other routine medications scheduled during appointment time  '[]'$  Hold blood pressure medications morning of treatment    Treatment process:  '[x]'$  Flow of appointment  '[x]'$  IV access Peripheral start  or  PORT access process '[]'$  EMLA cream  '[]'$  Premedication/ Hydration  '[x]'$  Length of treatment  '[x]'$  Tour of clinic    Diet and hydration:  '[]'$  Discuss Bland diet    '[x]'$  Eating Hints book provided  '[x]'$  Importance of hydration 48- 64 ounces daily   '[x]'$  Hydration handout provided     Side Effects:  '[x]'$  Chemotherapy and you book provided  '[x]'$  Side effects and management discussed   '[x]'$  Diarrhea'[x]'$ Nausea/Vomiting '[x]'$ home antiemetic '[]'$ hair loss'[x]'$ Neuropathy'[x]'$  Constipation'[x]'$  Fatigue'[x]'$ Mouth sores'[x]'$  Dehydration'[x]'$  Skin/Nail Changes '[x]'$ Pneumonitis '[x]'$ Colitis '[x]'$  Hepatitis '[x]'$ Thyroid changes  '[]'$ Fertility issues (birth control, sperm/egg banking issues)    Labs:  '[x]'$ BMP- renal function and electrolytes discussed  '[x]'$  CBC- RBC, WBC with ANC, platelet counts discussed  '[x]'$ Understanding your Blood hand out given   '[]'$ Neutropenia handout/Reduce infection handout '[]'$ Thrombocytopenia handout   '[]'$ Nadir discussed    Complications that require immediate attention from physician:  '[x]'$ Fever 100.3 '[x]'$ signs of infection- chills, fever, burning with urination, worsening cough   '[x]'$ Uncontrolled vomiting '[x]'$ Uncontrolled diarrhea/constipation   '[x]'$ Unable to drink 48 ounces '[x]'$ Uncontrolled pain  '[x]'$  When to notify provider handout given  '[x]'$  After hours contact process discussed    Home instructions:  '[]'$  Instruct to shut the lid and double flush toilet for 48 hours after chemotherapy   Instruct family members to wear gloves when will be handling  body fluids  Lots pf patient questions  answered and advised patient that some of the questions would need to be answered by the doctor and could set up a appointment to go over these questions . Patient given package insert on keytruda per his request. Patient wants to review and will call to set up appointment if has further questions or will talk to him on the day of chemotherapy if he decides to proceed with chemotherapy   Support Services:  '[]'$  Financial navigator   '[]'$ RN navigator explained  '[]'$ Local cancer society  '[]'$  Social worker    Patient and family verbalized understanding and all  questions answered. Encouraged to call for any concerns. Approximately 60 minutes spent with patient and family. Discharged in satisfactory condition. Ambulated off unit per self.

## 2021-10-17 NOTE — Discharge Instructions (Signed)
Please contact your Oncologist if you have any questions regarding the chemotherapy *** that you received today.      Patient instructed if experience any of the symptoms following today's chemotherapy / to notify MD immediately or go to emergency department.    * dizziness/lightheadedness  *acute nausea/vomiting - not relieved with medication  *headache - not relieved from Tylenol/pain medication  *chest pain/pressure  *rash/itching  *shortness of breath        Drink fluids - 48oz fluids daily  Call if develop fever/ chills/ signs or symptoms of infection teaching           Almyra Free will call with appointment once approval of medication is achieved

## 2021-10-17 NOTE — Plan of Care (Signed)
Problem: Chronic Conditions and Co-morbidities  Goal: Patient's chronic conditions and co-morbidity symptoms are monitored and maintained or improved  Outcome: Adequate for Discharge  Flowsheets (Taken 10/17/2021 1832)  Care Plan - Patient's Chronic Conditions and Co-Morbidity Symptoms are Monitored and Maintained or Improved:   Monitor and assess patient's chronic conditions and comorbid symptoms for stability, deterioration, or improvement   Collaborate with multidisciplinary team to address chronic and comorbid conditions and prevent exacerbation or deterioration  Note: Chemotherapy Teaching     What is Chemotherapy   Drug action '[x]'$    Method of Administration '[x]'$    Handouts given '[]'$      Side Effects  Nausea/vomiting '[x]'$    Diarrhea '[x]'$    Fatigue '[x]'$    Signs / Symptoms of infection '[x]'$    Neutropenia '[x]'$    Thrombocytopenia '[x]'$    Alopecia '[x]'$    neuropathy '[x]'$    Bland diet &  the importance of fluids '[x]'$        Micellaneous  Importance of nutrition '[x]'$    Importance of oral hygiene '[x]'$    When to call the MD '[x]'$    Monitoring labs '[x]'$    Use of supportive services '[]'$      Explanation of Drug Regimen / Frequency  Beryle Flock     Comments  Verbalized understanding to drug,action,side effects and when to call MD         Problem: Safety - Adult  Goal: Free from fall injury  Outcome: Adequate for Discharge  Flowsheets (Taken 10/17/2021 1832)  Free From Fall Injury: Instruct family/caregiver on patient safety  Note: No falls this admission      Problem: Discharge Planning  Goal: Discharge to home or other facility with appropriate resources  Outcome: Adequate for Discharge  Flowsheets (Taken 10/17/2021 1832)  Discharge to home or other facility with appropriate resources:   Identify barriers to discharge with patient and caregiver   Identify discharge learning needs (meds, wound care, etc)  Note: Patient  able to teach back follow up appointments and when to call the doctor. Patient offers no questions at this time    Care plan  reviewed with patient and he verbalized understanding of the plan of care and contributed to goal setting.

## 2021-10-18 ENCOUNTER — Ambulatory Visit: Admit: 2021-10-18 | Discharge: 2021-10-18 | Payer: MEDICAID | Attending: Neurological Surgery | Primary: Family Medicine

## 2021-10-18 DIAGNOSIS — Z9889 Other specified postprocedural states: Secondary | ICD-10-CM

## 2021-10-18 NOTE — Telephone Encounter (Signed)
Dr Jonne Ply from Neurosurgery is ordering an MRI of the brain, and the pt is wondering if you could order a CT chest. He is concerned about his lungs.    [facility] extension [phone]

## 2021-10-18 NOTE — Progress Notes (Signed)
[address]  [place] [address]  Dept: [phone]  Dept Fax: [phone]  Loc: [place] Follow Visit  Visit Date: 10/18/2021      Marcus Burnett  is a 61 y.o. male who is returning to the office today for a post-op follow-up visit. He had surgery on 07/31/2021 and 07/23/2021 (left frontal craniotomy and right occipital craniotomy for resection of multiple brain metastatic tumors).     Patient was evaluated today and is doing well overall.  No new complaints were voiced.  Still refusing radiation therapy however he is planning to start immunotherapy.  Patient  lives with their family  Wound: wound healing as expected  Last brain MRI showed (4//27/2023)    1. Postoperative changes involving the left frontal and right posterior temporal/occipital calvarium.  2. 1.3 x 1.1 cm and 0.7 x 0.9 cm ring-enhancing lesions in the left frontal lobe suggestive of treated metastases. 2.1 x 2.2 cm area of abnormal signal intensity in the right occipital lobe and 1.6 x 1.4 cm area of abnormal signal intensity in the right   posterior temporal lobe suggestive of treated metastases.  3. 4 mm enhancing lesion in the left posterior temporal lobe larger than on previous study dated 27th of March 2023, suspicious for a worsening metastatic lesion.  4. 9 x 5 mm enhancing lesion in the left parietal cortex larger than on previous study dated 27th of March 2023 suspicious for a worsening metastatic lesion..   5. Areas of increased signal intensity in the left frontal lobe, adjacent to the right sylvian fissure and in the right posterior temporal lobe which may represent posttreatment changes.  6. Areas of susceptibility artifact in the left frontal and right posterior temporal lobes.  7. 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        Assessment/Plan:  Status Post (left frontal craniotomy and right occipital craniotomy for resection of  multiple brain metastatic tumors).  Doing well overall  Encouraged gradual increase in physical and mental activity.  Seizure precaution.  Advice against driving was given.  Fall precaution and home safety education provided to patient.  Follow-up: 4 weeks with a new brain MRI      Electronically signed by Illene Labrador, MD on 10/18/21 at 8:34 AM EDT    *Time spent with the patient was 30 minutes. More than 50% was spent counseling/coordinating the patient's care.

## 2021-10-31 NOTE — Telephone Encounter (Signed)
Nav received vm message from August Saucer, RN in [place] office a faxed re: authorization request.      Nav spoke with J. Fields, who shares the plan was resubmitted yesterday d/t a glitch in the computer system; awaiting authorization at this time.      Pt is scheduled 6/16 at 1040 with [name].

## 2021-11-07 NOTE — Telephone Encounter (Signed)
Per Almyra Free in infusion, patient is refusing treatment. Just an FYI.

## 2021-11-23 ENCOUNTER — Encounter: Payer: MEDICAID | Attending: Medical Oncology | Primary: Family Medicine

## 2021-11-29 ENCOUNTER — Inpatient Hospital Stay
Admission: EM | Admit: 2021-11-29 | Discharge: 2021-12-02 | Disposition: A | Discharge status: Home / Self Care | Payer: MEDICAID | Admitting: Hospitalist

## 2021-11-29 ENCOUNTER — Inpatient Hospital Stay: Admit: 2021-11-29 | Payer: MEDICAID | Primary: Family Medicine

## 2021-11-29 DIAGNOSIS — C7931 Secondary malignant neoplasm of brain: Principal | ICD-10-CM

## 2021-11-29 DIAGNOSIS — Z9889 Other specified postprocedural states: Secondary | ICD-10-CM

## 2021-11-29 LAB — BASIC METABOLIC PANEL
BUN: 24 mg/dL — ABNORMAL HIGH (ref 7–22)
CO2: 24 meq/L (ref 23–33)
Calcium: 9.4 mg/dL (ref 8.5–10.5)
Chloride: 99 meq/L (ref 98–111)
Creatinine: 1.3 mg/dL — ABNORMAL HIGH (ref 0.4–1.2)
Glucose: 128 mg/dL — ABNORMAL HIGH (ref 70–108)
Potassium: 4.6 meq/L (ref 3.5–5.2)
Sodium: 133 meq/L — ABNORMAL LOW (ref 135–145)

## 2021-11-29 LAB — CBC WITH AUTO DIFFERENTIAL
Basophils Absolute: 0.1 10*3/uL (ref 0.0–0.1)
Basophils: 1.1 %
Eosinophils Absolute: 0.2 10*3/uL (ref 0.0–0.4)
Eosinophils: 2.6 %
Hematocrit: 41.6 % — ABNORMAL LOW (ref 42.0–52.0)
Hemoglobin: 13.3 gm/dl — ABNORMAL LOW (ref 14.0–18.0)
Immature Grans (Abs): 0.04 10*3/uL (ref 0.00–0.07)
Immature Granulocytes: 0.5 %
Lymphocytes Absolute: 1.4 10*3/uL (ref 1.0–4.8)
Lymphocytes: 17.5 %
MCH: 28.1 pg (ref 26.0–33.0)
MCHC: 32 gm/dl — ABNORMAL LOW (ref 32.2–35.5)
MCV: 87.8 fL (ref 80.0–94.0)
MPV: 10.4 fL (ref 9.4–12.4)
Monocytes Absolute: 0.7 10*3/uL (ref 0.4–1.3)
Monocytes: 8.3 %
Platelets: 313 10*3/uL (ref 130–400)
RBC: 4.74 10*6/uL (ref 4.70–6.10)
RDW-CV: 13.9 % (ref 11.5–14.5)
RDW-SD: 43.9 fL (ref 35.0–45.0)
Seg Neutrophils: 70 %
Segs Absolute: 5.7 10*3/uL (ref 1.8–7.7)
WBC: 8.2 10*3/uL (ref 4.8–10.8)
nRBC: 0 /100 wbc

## 2021-11-29 LAB — ANION GAP: Anion Gap: 10 meq/L (ref 8.0–16.0)

## 2021-11-29 LAB — POCT GLUCOSE
POC Glucose: 113 mg/dl — ABNORMAL HIGH (ref 70–108)
POC Glucose: 250 mg/dl — ABNORMAL HIGH (ref 70–108)
POC Glucose: 266 mg/dl — ABNORMAL HIGH (ref 70–108)

## 2021-11-29 LAB — OSMOLALITY: Osmolality Calc: 272.1 mOsmol/kg — ABNORMAL LOW (ref 275.0–300.0)

## 2021-11-29 LAB — GLOMERULAR FILTRATION RATE, ESTIMATED: Est, Glom Filt Rate: >60 mL/min/{1.73_m2} (ref >60)

## 2021-11-29 MED ORDER — INSULIN LISPRO 100 UNIT/ML IJ SOLN
100 UNIT/ML | Freq: Every evening | INTRAMUSCULAR | Status: AC
Start: 2021-11-29 — End: 2021-11-30

## 2021-11-29 MED ORDER — DEXTROSE 10 % IV SOLN
10 % | INTRAVENOUS | Status: AC | PRN
Start: 2021-11-29 — End: 2021-12-01

## 2021-11-29 MED ORDER — ONDANSETRON HCL 4 MG/2ML IJ SOLN
4 MG/2ML | Freq: Four times a day (QID) | INTRAMUSCULAR | Status: AC | PRN
Start: 2021-11-29 — End: 2021-12-02

## 2021-11-29 MED ORDER — INSULIN LISPRO 100 UNIT/ML IJ SOLN
100 UNIT/ML | Freq: Three times a day (TID) | INTRAMUSCULAR | Status: AC
Start: 2021-11-29 — End: 2021-11-30
  Administered 2021-11-30: 21:00:00 2 [IU] via SUBCUTANEOUS
  Administered 2021-11-30: 17:00:00 8 [IU] via SUBCUTANEOUS

## 2021-11-29 MED ORDER — DEXAMETHASONE SODIUM PHOSPHATE 4 MG/ML IJ SOLN
4 MG/ML | Freq: Four times a day (QID) | INTRAMUSCULAR | Status: AC
Start: 2021-11-29 — End: 2021-12-02
  Administered 2021-11-29 – 2021-12-02 (×12): 4 mg via INTRAVENOUS

## 2021-11-29 MED ORDER — LISINOPRIL 40 MG PO TABS
40 MG | Freq: Every day | ORAL | Status: AC
Start: 2021-11-29 — End: 2021-12-02
  Administered 2021-11-29 – 2021-12-01 (×3): 40 mg via ORAL

## 2021-11-29 MED ORDER — NORMAL SALINE FLUSH 0.9 % IV SOLN
0.9 % | Freq: Two times a day (BID) | INTRAVENOUS | Status: AC
Start: 2021-11-29 — End: 2021-12-02
  Administered 2021-11-30 – 2021-12-02 (×5): 10 mL via INTRAVENOUS

## 2021-11-29 MED ORDER — GLUCOSE 4 G PO CHEW
4 g | ORAL | Status: AC | PRN
Start: 2021-11-29 — End: 2021-11-30

## 2021-11-29 MED ORDER — DEXTROSE 10 % IV BOLUS
INTRAVENOUS | Status: AC | PRN
Start: 2021-11-29 — End: 2021-12-01

## 2021-11-29 MED ORDER — ACETAMINOPHEN 325 MG PO TABS
325 | Freq: Four times a day (QID) | ORAL | Status: DC | PRN
Start: 2021-11-29 — End: 2021-12-02

## 2021-11-29 MED ORDER — NORMAL SALINE FLUSH 0.9 % IV SOLN
0.9 % | INTRAVENOUS | Status: AC | PRN
Start: 2021-11-29 — End: 2021-12-02
  Administered 2021-11-29: 20:00:00 10 mL via INTRAVENOUS

## 2021-11-29 MED ORDER — LEVETIRACETAM 500 MG PO TABS
500 MG | Freq: Two times a day (BID) | ORAL | Status: DC
Start: 2021-11-29 — End: 2021-11-29

## 2021-11-29 MED ORDER — ONDANSETRON 4 MG PO TBDP
4 MG | Freq: Three times a day (TID) | ORAL | Status: AC | PRN
Start: 2021-11-29 — End: 2021-12-02

## 2021-11-29 MED ORDER — SODIUM CHLORIDE 0.9 % IV SOLN
0.9 % | INTRAVENOUS | Status: AC | PRN
Start: 2021-11-29 — End: 2021-12-02

## 2021-11-29 MED ORDER — GLUCAGON HCL RDNA (DIAGNOSTIC) 1 MG IJ SOLR
1 MG | INTRAMUSCULAR | Status: AC | PRN
Start: 2021-11-29 — End: 2021-12-01

## 2021-11-29 MED ORDER — GADOTERIDOL 279.3 MG/ML IV SOLN
279.3 MG/ML | Freq: Once | INTRAVENOUS | Status: AC | PRN
Start: 2021-11-29 — End: 2021-11-29
  Administered 2021-11-29: 14:00:00 20 mL via INTRAVENOUS

## 2021-11-29 MED ORDER — ACETAMINOPHEN 650 MG RE SUPP
650 | Freq: Four times a day (QID) | RECTAL | Status: DC | PRN
Start: 2021-11-29 — End: 2021-12-02

## 2021-11-29 MED ORDER — POLYETHYLENE GLYCOL 3350 17 G PO PACK
17 g | Freq: Every day | ORAL | Status: AC | PRN
Start: 2021-11-29 — End: 2021-12-02

## 2021-11-29 MED FILL — LISINOPRIL 40 MG PO TABS: 40 MG | ORAL | Qty: 1

## 2021-11-29 MED FILL — DEXAMETHASONE SODIUM PHOSPHATE 4 MG/ML IJ SOLN: 4 MG/ML | INTRAMUSCULAR | Qty: 1

## 2021-11-29 NOTE — Progress Notes (Signed)
Pt admitted to  5K12 via in a wheelchair and from ED from ED.  Complaints: None.    IV IV push only, no IV fluids infusing into the forearm left, condition patent and no redness .IV site free of s/s of infection or infiltration. Vital signs obtained. Assessment and data collection initiated. Two nurse skin assessment performed by Marlise Eves RN RN and Linus Orn RN. Oriented to room. Policies and procedures for 5K explained. All questions answered with no further questions at this time. Fall prevention and safety brochure discussed with patient.  Bed alarm on. Call light in reach.Oriented to room.   Carl Best, RN, RN 11/29/2021 3:42 PM     Explained patients right to have family, representative or physician notified of their admission.  Patient has Declined for physician to be notified.  Patient has Declined for family/representative to be notified.

## 2021-11-29 NOTE — ED Notes (Signed)
ED to inpatient nurses report    Chief Complaint   Patient presents with    Fatigue      Present to ED from home  LOC: alert and orientated to name, place, date  Vital signs   Vitals:    11/29/21 1045 11/29/21 1232 11/29/21 1340   BP: (!) 166/102 (!) 152/79 (!) 164/97   Pulse: 65 67 63   Resp: '18 18 15   '$ Temp: 97.6 F (36.4 C)     TempSrc: Oral     SpO2: 96% 99% 93%   Weight: 270 lb (122.5 kg)     Height: '5\' 10"'$  (1.778 m)        Oxygen Baseline 97% room air    Current needs required none.   LDAs:   Peripheral IV 11/29/21 Left;Posterior;Proximal Forearm (Active)   Site Assessment Clean, dry & intact 11/29/21 1343     Mobility: Requires assistance * 1  Pending ED orders: none  Present condition: stable      C-SSRS Risk of Suicide: No Risk  Swallow Screening  passed  Preferred Language: [place]     Electronically signed by Louann Sjogren, RN on 11/29/2021 at 2:13 PM      Louann Sjogren, RN  11/29/21 1414

## 2021-11-29 NOTE — ED Notes (Signed)
Pt transported to 5K12 in stable condition. Floor called prior to transport. Spoke with Agricultural consultant.     Delray Alt  11/29/21 1425

## 2021-11-29 NOTE — ED Provider Notes (Signed)
[place]       Chief Complaint   Patient presents with    Fatigue       Nurses Notes reviewed and I agree except as noted in the HPI.      HISTORY OF PRESENT ILLNESS    Marcus Burnett is a 61 y.o. male who presents with complaint of right-sided weakness, history of bladder cancer with brain mets, had an MRI today that shows worsening brain mets.  Onset: Acute on chronic  Duration: Unknown  Timing: Persistent  Location of Pain: Chronic low back pain  Intesity/severity:   Modifying Factors: History of metastatic bladder cancer  Relieved by;  Previous Episodes;  Tx Before arrival: None  REVIEW OF SYSTEMS       PAST MEDICAL HISTORY    has a past medical history of Cancer ([place]), Diabetes mellitus ([place]), and Hypertension.    SURGICAL HISTORY      has a past surgical history that includes craniotomy (Right, 07/26/2021); craniotomy (Left, 07/31/2021); Kidney removal; and craniotomy (N/A, 08/22/2021).    CURRENT MEDICATIONS       Current Discharge Medication List        CONTINUE these medications which have NOT CHANGED    Details   naproxen sodium (ANAPROX) 220 MG tablet Take 1-2 tablets by mouth 2 times daily as needed      levETIRAcetam (KEPPRA) 500 MG tablet Take 1 tablet by mouth 2 times daily  Qty: 60 tablet, Refills: 0      TRULICITY 1.5 OZ/3.0QM SOPN INJECT 0.5 ML (1.'5MG'$  DOSE) INTO THE SKIN EVERY 7 DAYS      metFORMIN (GLUCOPHAGE) 500 MG tablet 2 tablets daily      glipiZIDE (GLUCOTROL XL) 10 MG extended release tablet TAKE 1 TABLET BY MOUTH EVERY DAY      lisinopril (PRINIVIL;ZESTRIL) 40 MG tablet TAKE 1 TABLET BY MOUTH EVERY DAY      pravastatin (PRAVACHOL) 80 MG tablet TAKE 1 TABLET BY MOUTH EVERY DAY             ALLERGIES     is allergic to dapagliflozin.    FAMILY HISTORY     He indicated that his mother is alive. He indicated that his father is deceased.   family history includes Diabetes in his father; Heart Disease in his father; Kidney Disease in his father.    SOCIAL HISTORY       reports that he has never smoked. He has never been exposed to tobacco smoke. He has never used smokeless tobacco. He reports that he does not drink alcohol and does not use drugs.    PHYSICAL EXAM     INITIAL VITALS:  height is '5\' 10"'$  (1.778 m) and weight is 268 lb 1.3 oz (121.6 kg). His oral temperature is 97.4 F (36.3 C). His blood pressure is 147/77 (abnormal) and his pulse is 59. His respiration is 16 and oxygen saturation is 96%.    Physical Exam   Constitutional:  well-developed and well-nourished.   HENT: Head: Normocephalic, atraumatic, Bilateral external ears normal, Oropharynx mosit, No oral exudates, Nose normal.   Eyes: PERRL, EOMI, Conjunctiva normal, No discharge. No scleral icterus  Neck: Normal range of motion, No tenderness, Supple  Cardiovascular: Normal rate, regular rhythm, S1 normal and S2 normal.  Exam reveals no gallop.    Pulmonary/Chest: Effort normal and breath sounds normal. No accessory muscle usage or stridor. No respiratory distress.  no wheezes. has  no rales. exhibits no tenderness.   Abdominal: Soft. Bowel sounds are normal.  exhibits no distension. There is no tenderness. There is no rebound and no guarding.   Extremities: No edema, no tenderness, no cyanosis, no clubbing.    Musculoskeletal: Good range of motion in major joints is observed.  No major deformities noted.  Neurological: Alert and oriented 3, mild weakness  Right lower extremity, normal sensory function, no focal deficits.  GCS 15  Skin: Skin is warm, dry and intact. No rash noted. No erythema.   Psychiatric: Affect normal, judgment normal, mood normal.  DIFFERENTIAL DIAGNOSIS:           MEDICAL DECISION MAKING / ED COURSE:     1) Number and Complexity of Problems            Problem List This Visit:         Chief Complaint   Patient presents with    Fatigue            Differential Diagnosis includes (but not limited to):  Brain metastasis        Diagnoses Considered but I have low suspicion of:                 Pertinent Comorbid Conditions:        2)  Data Reviewed (none if left blank)          My Independent interpretations:     EKG:      Stable    Imaging: Right shows worsening brain mets    Labs:      Nonacute                 Decision Rules/Clinical Scores utilized: GCS            External Documentation Reviewed:         Previous patient encounter documents & history available on EMR was reviewed              See Formal Diagnostic Results above for the lab and radiology tests and orders.    3)  Treatment and Disposition         ED Reassessment: Patient has mild weakness following right lower extremity.  Overall is a poor historian, but on exam he has mild weakness, states that he has intermittent pain involving his right hip/low back.         Case discussed with consulting clinician: Oncology/neurosurgery/hospitalist         Shared Decision-Making was performed and disposition discussed with the        Patient/Family and questions answered          Social determinants of health impacting treatment or disposition:           Code Status: Full      Summary of Patient Presentation:      MDM  /   Vitals Reviewed:    Vitals:    11/29/21 1045 11/29/21 1232 11/29/21 1340 11/29/21 1446   BP: (!) 166/102 (!) 152/79 (!) 164/97 (!) 147/77   Pulse: 65 67 63 59   Resp: '18 18 15 16   '$ Temp: 97.6 F (36.4 C)   97.4 F (36.3 C)   TempSrc: Oral   Oral   SpO2: 96% 99% 93% 96%   Weight: 270 lb (122.5 kg)   268 lb 1.3 oz (121.6 kg)   Height: '5\' 10"'$  (1.778 m)   '5\' 10"'$  (1.778 m)       The  patient was seen and examined. Appropriate diagnostic testing was performed and results reviewed with the patient.      The results of pertinent diagnostic studies and exam findings were discussed. The patient's provisional diagnosis and plan of care were discussed with the patient and present family who expressed understanding. Any medications were reviewed and indications and risks of medications were discussed with the patient /family present. Strict  verbal and written return precautions, instructions and appropriate follow-up provided to  the patient.     ED Medications administered this visit:  (None if blank)  Medications   sodium chloride flush 0.9 % injection 5-40 mL (has no administration in time range)   sodium chloride flush 0.9 % injection 5-40 mL (10 mLs IntraVENous Given 11/29/21 1533)   0.9 % sodium chloride infusion (has no administration in time range)   ondansetron (ZOFRAN-ODT) disintegrating tablet 4 mg (has no administration in time range)     Or   ondansetron (ZOFRAN) injection 4 mg (has no administration in time range)   polyethylene glycol (GLYCOLAX) packet 17 g (has no administration in time range)   acetaminophen (TYLENOL) tablet 650 mg (has no administration in time range)     Or   acetaminophen (TYLENOL) suppository 650 mg (has no administration in time range)   dexamethasone (DECADRON) injection 4 mg (4 mg IntraVENous Given 11/29/21 1533)   lisinopril (PRINIVIL;ZESTRIL) tablet 40 mg (has no administration in time range)   insulin lispro (HUMALOG) injection vial 0-8 Units (has no administration in time range)   insulin lispro (HUMALOG) injection vial 0-4 Units (has no administration in time range)   glucose chewable tablet 16 g (has no administration in time range)   dextrose bolus 10% 125 mL (has no administration in time range)     Or   dextrose bolus 10% 250 mL (has no administration in time range)   glucagon (rDNA) injection 1 mg (has no administration in time range)   dextrose 10 % infusion (has no administration in time range)               DIAGNOSTIC RESULTS     EKG: All EKG's are interpreted by the Emergency Department Physician who either signs or Co-signs this chart in the absence of a cardiologist.      RADIOLOGY: non-plain film images(s) such as CT, Ultrasound and MRI are read by the radiologist.  Plain radiographic images are visualized and preliminarily interpreted by the emergency physician unless otherwise stated  below.      LABS:   Labs Reviewed   CBC WITH AUTO DIFFERENTIAL - Abnormal; Notable for the following components:       Result Value    Hemoglobin 13.3 (*)     Hematocrit 41.6 (*)     MCHC 32.0 (*)     All other components within normal limits   BASIC METABOLIC PANEL - Abnormal; Notable for the following components:    Sodium 133 (*)     Glucose 128 (*)     BUN 24 (*)     Creatinine 1.3 (*)     All other components within normal limits   OSMOLALITY - Abnormal; Notable for the following components:    Osmolality Calc 272.1 (*)     All other components within normal limits   ANION GAP   GLOMERULAR FILTRATION RATE, ESTIMATED   POCT GLUCOSE   POCT GLUCOSE       EMERGENCY DEPARTMENT COURSE:   Vitals:    Vitals:  11/29/21 1045 11/29/21 1232 11/29/21 1340 11/29/21 1446   BP: (!) 166/102 (!) 152/79 (!) 164/97 (!) 147/77   Pulse: 65 67 63 59   Resp: '18 18 15 16   '$ Temp: 97.6 F (36.4 C)   97.4 F (36.3 C)   TempSrc: Oral   Oral   SpO2: 96% 99% 93% 96%   Weight: 270 lb (122.5 kg)   268 lb 1.3 oz (121.6 kg)   Height: '5\' 10"'$  (1.778 m)   '5\' 10"'$  (1.778 m)         CRITICAL CARE:       CONSULTS:  None    PROCEDURES:  none    FINAL IMPRESSION      1. Metastasis to brain [facility])          DISPOSITION/PLAN   Admitted    PATIENT REFERRED TO:  No follow-up provider specified.    DISCHARGE MEDICATIONS:  Current Discharge Medication List          (Please note that portions of this note were completed with a voice recognition program.  Efforts were made to edit the dictations but occasionally words are mis-transcribed.)    Humberto Leep, DO            Humberto Leep, DO  11/29/21 1608

## 2021-11-29 NOTE — ED Notes (Signed)
Patient ambulated back from using restroom and is resting comfortably in bed with family at bedside at this time.      Marcus Burnett  11/29/21 1234

## 2021-11-29 NOTE — Progress Notes (Incomplete)
Seen

## 2021-11-29 NOTE — ED Notes (Signed)
Patient resting in bed comfortably at this time with family present at bedside. Warm blanket provided.      Louann Sjogren, RN  11/29/21 1343

## 2021-11-29 NOTE — H&P (Signed)
History & Physical    Patient:  Marcus Burnett  Date of Birth: 1960-10-22  Date of Service: 11/29/2021  MRN: [id number]   Acct:  [id number]   Primary Care Physician: [name], MD    Chief Complaint: left sided weakness    History of Present Illness:   History obtained from the patient and wife.    The patient is a 61 y.o. male who presents with complaints of worsening right-sided weakness.  Patient reports that 2 weeks ago he was perfectly fine and zip lining with his 30 year old mother.  4 days later patient reports he was walking on the beach and was trying to put his shoe on but fell and was unable to get back up.  He states hat he has been having to drag his right foot.  He reports 1 out of 10 headache.  Denies any photophobia, nausea, or dizziness.  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  Patient is status post craniotomy with resection of left-sided tumors on July 31, 2021.    Today patient had an outpatient MRI which confirmed worsening metastatic disease of the brain with surrounding edema.  Patient states that he has an appointment tomorrow with neurosurgery and with oncology.  They report he sees [name] who holds office here once a month.  Wife reports patient's not interested in radiation oncology.    Past Medical History:        Diagnosis Date    Cancer ([place])     Diabetes mellitus ([place])     Hypertension        Past Surgical History:        Procedure Laterality Date    CRANIOTOMY Right 07/26/2021    RIGHT SIDED CRANIOTOMY FOR 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 performed by Illene Labrador, MD at [place] Left 07/31/2021    Left Craniotomy Resection of Tumor performed by Illene Labrador, MD at [place] N/A 08/22/2021    I & D AND CLOSURE FOR LEFT CRANIAL WOUND DEHISENCE performed by Darrol Angel, MD at [place] Medications:   No current facility-administered  medications on file prior to encounter.     Current Outpatient Medications on File Prior to Encounter   Medication Sig Dispense Refill    naproxen sodium (ANAPROX) 220 MG tablet Take 1-2 tablets by mouth 2 times daily as needed      levETIRAcetam (KEPPRA) 500 MG tablet Take 1 tablet by mouth 2 times daily 60 tablet 0    TRULICITY 1.5 XL/2.4MW SOPN INJECT 0.5 ML (1.'5MG'$  DOSE) INTO THE SKIN EVERY 7 DAYS      metFORMIN (GLUCOPHAGE) 500 MG tablet 2 tablets daily      glipiZIDE (GLUCOTROL XL) 10 MG extended release tablet TAKE 1 TABLET BY MOUTH EVERY DAY      lisinopril (PRINIVIL;ZESTRIL) 40 MG tablet TAKE 1 TABLET BY MOUTH EVERY DAY      pravastatin (PRAVACHOL) 80 MG tablet TAKE 1 TABLET BY MOUTH EVERY DAY         Allergies:  Dapagliflozin    Social History:    reports that he has never smoked. He has never been exposed to tobacco smoke. He has never used smokeless tobacco. He reports that he does not drink alcohol and does not use drugs.    Family History:  Problem Relation Age of Onset    Kidney Disease Father     Heart Disease Father     Diabetes Father        Review of systems:  Constitutional: no fever, no night sweats, no fatigue  Head: no headache, no head injury, no migranes.  Eye: no blurring of vision, no double vision.  Ears: no hearing difficulty, no tinnitus  Mouth/throat: no ulceration, dental caries, dysphagia  Lungs: no cough, no shortness of breath, no wheeze  CVS: no palpitation, no chest pain, no shortness of breath  GI: no abdominal pain, no nausea , no vomiting, no constipation  GUS: no dysuria, frequency and urgency, no hematuria, no kidney stones  Musculoskeletal: no joint pain, swelling , stiffness  Endocrine: no polyuria, polydypsia, no cold or heat intolerence  Hematology: no anemia, no easy brusing or bleeding, no hx of clotting disorder  Dermatology: no skin rash, no eczema, no prurities,  Psychiatry: no depression, no anxiety,no panic attacks, no suicide ideation  Neurology: Right-sided  weakness  10 point review of systems completed, all other than noted above are negative.       Vitals:   Vitals:    11/29/21 1446   BP: (!) 147/77   Pulse: 59   Resp: 16   Temp: 97.4 F (36.3 C)   SpO2: 96%      BMI: Body mass index is 38.47 kg/m.                Exam:  Physical Examination: General appearance - alert, awake appears to be in no acute distress  HEENT: Atraumatic normocephalic,no JVD, trachea is midline  Neck - supple, no significant adenopathy, no JVD, or carotid bruits  Chest - Bilateral air entry, no wheezes, crackles or rhonchi.  Non tender to palpation of chest wall  Heart - S1S2 RRR, no murmurs or gallops  Abdomen - Soft, non tender non distended.  Normoactive bowel sounds  Neurological - II-XII grossly intact, no neurological deficits.  Right pronator drift  Musculoskeletal - 4/5 power right upper and lower extremity  Extremities - no edema, no cyanosis or clubbing  Skin - normal coloration and turgor, no rashes, no suspicious skin lesions noted      Review of Labs and Diagnostic Testing:  CBC:   Recent Labs     11/29/21  1145   WBC 8.2   HGB 13.3*   PLT 313     BMP:    Recent Labs     11/29/21  1145   NA 133*   K 4.6   CL 99   CO2 24   BUN 24*   CREATININE 1.3*   GLUCOSE 128*     Calcium:  Recent Labs     11/29/21  1145   CALCIUM 9.4       ABGs:   Lab Results   Component Value Date/Time    PH 7.42 07/31/2021 11:35 AM    PCO2 36 07/31/2021 11:35 AM    PO2 119 07/31/2021 11:35 AM    HCO3 23 07/31/2021 11:35 AM    O2SAT 99 07/31/2021 11:35 AM       Radiology:     MRI BRAIN W WO CONTRAST    Result Date: 11/29/2021  PROCEDURE: MRI BRAIN W WO CONTRAST CLINICAL INFORMATIONS/P craniotomy. 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 COMPARISON: MRI scan of the brain dated 11 October 2021.Marland Kitchen TECHNIQUE: Multiplanar and multiple spin echo T1 and T2-weighted images were obtained through the brain  before and after the administration of intravenous contrast. FINDINGS: There are postoperative changes  involving the left frontal, right posterior temporal/occipital calvarium. There has been significant interval deterioration. There is a new 1.6 x 1.2 cm enhancing lesion in the left parietal cortex with extensive surrounding edema. There has been enlargement of the lesion in the left temporal lobe which measures 1.6 x 1.3 cm in size. There has been enlargement of the lesions in the left cerebellar hemisphere which measure 1.3 x 1.2 cm in size. There is a 1.3 x 0.8 cm lesion in the left frontal lobe, unchanged. This may represent a treated metastatic lesion. There are areas of encephalomalacia in the right temporal lobe and right occipital lobe which may represent treated metastases. The diffusion-weighted images are otherwise normal. The brain volume is normal.There are no intra-or extra-axial collections.  There is no hydrocephalus, midline shift or mass effect. On the FLAIR and T2-weighted sequences, there is increased signal intensity in the white matter most likely representing ischemic changes. There are areas of encephalomalacia in the right frontal and right posterior temporal/occipital lobes. On the gradient echo T2-weighted images, there  susceptibility artifact in the left frontal lobe at the site of previous surgery. There is no articular abnormal enhancement in the brain. The major intracranial vascular flow voids are present.  The midline craniocervical junction structures are normal.  The brainstem and pituitary gland are normal. There is a possible mucocele in the frontal sinus. There are inflammatory changes in ethmoid air cells and maxillary sinuses bilaterally.     1. Significant interval deterioration since previous study dated 10/11/2021 with a new 1.6 x 1.2 cm enhancing lesion in the left parietal cortex with extensive surrounding edema. 2. Enlargement of lesion in the left temporal lobe which measures 1.6 x 1.3 cm in size. There is surrounding edema. 3. Enlargement of the lesion in the left  cerebellar hemisphere which measures 1.3 x 1.2 cm in size. There is surrounding edema. 4. No change in the 1.3 x 0.8 cm lesion in the left frontal lobe and areas of abnormal signal intensity in the right temporal lobe and right occipital lobe which may represent treated metastases. 5. Mild atrophy and probable ischemic changes in the white matter. 6. Mucosal in the frontal sinus. 7. Inflammatory changes in the ethmoid air cells and maxillary sinuses bilaterally. **This report has been created using voice recognition software. It may contain minor errors which are inherent in voice recognition technology.** Final report electronically signed by DR Drue Flirt on 11/29/2021 11:17 AM        Principal Problem:    Bladder cancer metastasized to brain [facility])  Resolved Problems:    * No resolved hospital problems. *      Assessment and Plan:    Metastatic disease to the brain: MRI brain reviewed shows new lesion in the left parietal region measuring 1.6 x 1.2 cm with extensive surrounding edema.  Enlargement of the left temporal and left cerebellar lesion.  We will start patient on Decadron 4 mg IV every 6 hours.  Continue with Keppra for seizure prophylaxis.  Neurosurgery consulted.  Again declines radiation oncology.  Renal cell carcinoma: Patient status post nephrectomy in 2022  Diabetes: We will hold oral meds and start patient on as needed sliding scale.  HTN:  continue with Lisinopril    DVT prophylaxis: '[]'$  Lovenox                                 [  x] SCDs                                 '[]'$  SQ Heparin                                 '[]'$  Encourage ambulation, low risk for DVT, no chemical or mechanical prophylaxis necessary              '[]'$  Already on Anticoagulation                Anticipated Disposition upon discharge: '[]'$  Home                                                                         '[]'$  Home with Home Health                                                                         '[]'$  [place]                                                                         '[]'$  [facility]          Electronically signed by Nigel Sloop, MD on 11/29/2021 at 3:06 PM

## 2021-11-29 NOTE — ED Triage Notes (Signed)
Patient presents to ED from home with report of new onset weakness about a week ago. Patient states he has kidney cancer that has metastasized and brain tumors as well. Patient reports right leg weakness recently and denies any pain at this time.

## 2021-11-30 ENCOUNTER — Encounter: Payer: MEDICAID | Attending: Neurological Surgery | Primary: Family Medicine

## 2021-11-30 ENCOUNTER — Encounter: Payer: MEDICAID | Attending: Medical Oncology | Primary: Family Medicine

## 2021-11-30 ENCOUNTER — Inpatient Hospital Stay: Admit: 2021-11-30 | Payer: MEDICAID | Primary: Family Medicine

## 2021-11-30 ENCOUNTER — Ambulatory Visit: Payer: MEDICAID | Primary: Family Medicine

## 2021-11-30 LAB — BASIC METABOLIC PANEL
BUN: 19 mg/dL (ref 7–22)
CO2: 21 meq/L — ABNORMAL LOW (ref 23–33)
Calcium: 10.3 mg/dL (ref 8.5–10.5)
Chloride: 97 meq/L — ABNORMAL LOW (ref 98–111)
Creatinine: 1.2 mg/dL (ref 0.4–1.2)
Glucose: 199 mg/dL — ABNORMAL HIGH (ref 70–108)
Potassium: 5.6 meq/L — ABNORMAL HIGH (ref 3.5–5.2)
Sodium: 134 meq/L — ABNORMAL LOW (ref 135–145)

## 2021-11-30 LAB — POCT GLUCOSE
POC Glucose: 171 mg/dl — ABNORMAL HIGH (ref 70–108)
POC Glucose: 178 mg/dl — ABNORMAL HIGH (ref 70–108)
POC Glucose: 349 mg/dl — ABNORMAL HIGH (ref 70–108)
POC Glucose: 219 mg/dl — ABNORMAL HIGH (ref 70–108)
POC Glucose: 276 mg/dl — ABNORMAL HIGH (ref 70–108)

## 2021-11-30 LAB — CBC
Hematocrit: 42.7 % (ref 42.0–52.0)
Hemoglobin: 13.6 gm/dl — ABNORMAL LOW (ref 14.0–18.0)
MCH: 27.5 pg (ref 26.0–33.0)
MCHC: 31.9 gm/dl — ABNORMAL LOW (ref 32.2–35.5)
MCV: 86.4 fL (ref 80.0–94.0)
MPV: 10.5 fL (ref 9.4–12.4)
Platelets: 362 10*3/uL (ref 130–400)
RBC: 4.94 10*6/uL (ref 4.70–6.10)
RDW-CV: 13.9 % (ref 11.5–14.5)
RDW-SD: 42.6 fL (ref 35.0–45.0)
WBC: 9.9 10*3/uL (ref 4.8–10.8)

## 2021-11-30 LAB — GLOMERULAR FILTRATION RATE, ESTIMATED: Est, Glom Filt Rate: >60 mL/min/{1.73_m2} (ref >60)

## 2021-11-30 LAB — ANION GAP: Anion Gap: 16 meq/L (ref 8.0–16.0)

## 2021-11-30 MED ORDER — INSULIN LISPRO 100 UNIT/ML IJ SOLN
100 UNIT/ML | Freq: Three times a day (TID) | INTRAMUSCULAR | Status: AC
Start: 2021-11-30 — End: 2021-12-02
  Administered 2021-12-01: 12:00:00 4 [IU] via SUBCUTANEOUS
  Administered 2021-12-01: 16:00:00 12 [IU] via SUBCUTANEOUS
  Administered 2021-12-01 – 2021-12-02 (×3): 8 [IU] via SUBCUTANEOUS

## 2021-11-30 MED ORDER — DEXTROSE 10 % IV SOLN
10 % | INTRAVENOUS | Status: AC | PRN
Start: 2021-11-30 — End: 2021-12-02

## 2021-11-30 MED ORDER — PRAVASTATIN SODIUM 80 MG PO TABS
80 MG | Freq: Every day | ORAL | Status: AC
Start: 2021-11-30 — End: 2021-12-02
  Administered 2021-12-01 – 2021-12-02 (×3): 80 mg via ORAL

## 2021-11-30 MED ORDER — GLUCAGON HCL RDNA (DIAGNOSTIC) 1 MG IJ SOLR
1 MG | INTRAMUSCULAR | Status: AC | PRN
Start: 2021-11-30 — End: 2021-12-02

## 2021-11-30 MED ORDER — GADOTERIDOL 279.3 MG/ML IV SOLN
279.3 MG/ML | Freq: Once | INTRAVENOUS | Status: AC | PRN
Start: 2021-11-30 — End: 2021-11-30
  Administered 2021-11-30: 17:00:00 20 mL via INTRAVENOUS

## 2021-11-30 MED ORDER — DEXTROSE 10 % IV BOLUS
Freq: Once | INTRAVENOUS | Status: DC
Start: 2021-11-30 — End: 2021-12-01

## 2021-11-30 MED ORDER — GLUCOSE 4 G PO CHEW
4 g | ORAL | Status: AC | PRN
Start: 2021-11-30 — End: 2021-12-02

## 2021-11-30 MED ORDER — DEXTROSE 10 % IV BOLUS
INTRAVENOUS | Status: DC | PRN
Start: 2021-11-30 — End: 2021-12-02

## 2021-11-30 MED ORDER — DEXTROSE 10 % IV BOLUS
INTRAVENOUS | Status: AC | PRN
Start: 2021-11-30 — End: 2021-12-02

## 2021-11-30 MED ORDER — INSULIN REGULAR HUMAN 100 UNIT/ML IJ SOLN
100 UNIT/ML | Freq: Once | INTRAMUSCULAR | Status: AC
Start: 2021-11-30 — End: 2021-12-01

## 2021-11-30 MED ORDER — INSULIN LISPRO 100 UNIT/ML IJ SOLN
100 UNIT/ML | Freq: Every evening | INTRAMUSCULAR | Status: AC
Start: 2021-11-30 — End: 2021-12-02

## 2021-11-30 MED FILL — HUMULIN R 100 UNIT/ML IJ SOLN: 100 UNIT/ML | INTRAMUSCULAR | Qty: 10

## 2021-11-30 MED FILL — DEXAMETHASONE SODIUM PHOSPHATE 4 MG/ML IJ SOLN: 4 MG/ML | INTRAMUSCULAR | Qty: 1

## 2021-11-30 MED FILL — LISINOPRIL 40 MG PO TABS: 40 MG | ORAL | Qty: 1

## 2021-11-30 MED FILL — HUMALOG 100 UNIT/ML IJ SOLN: 100 UNIT/ML | INTRAMUSCULAR | Qty: 2

## 2021-11-30 MED FILL — PRAVASTATIN SODIUM 80 MG PO TABS: 80 MG | ORAL | Qty: 1

## 2021-11-30 MED FILL — HUMALOG 100 UNIT/ML IJ SOLN: 100 UNIT/ML | INTRAMUSCULAR | Qty: 8

## 2021-11-30 NOTE — Plan of Care (Signed)
Problem: Chronic Conditions and Co-morbidities  Goal: Patient's chronic conditions and co-morbidity symptoms are monitored and maintained or improved  Outcome: Progressing  Flowsheets  Taken 11/30/2021 2219 by Frederich Balding, [place] - Patient's Chronic Conditions and Co-Morbidity Symptoms are Monitored and Maintained or Improved:   Monitor and assess patient's chronic conditions and comorbid symptoms for stability, deterioration, or improvement   Collaborate with multidisciplinary team to address chronic and comorbid conditions and prevent exacerbation or deterioration  Taken 11/30/2021 0820 by Edman Circle, [place] - Patient's Chronic Conditions and Co-Morbidity Symptoms are Monitored and Maintained or Improved: Monitor and assess patient's chronic conditions and comorbid symptoms for stability, deterioration, or improvement     Problem: Safety - Adult  Goal: Free from fall injury  Outcome: Progressing  Flowsheets (Taken 11/30/2021 2219)  Free From Fall Injury:   Instruct family/caregiver on patient safety   Based on caregiver fall risk screen, instruct family/caregiver to ask for assistance with transferring infant if caregiver noted to have fall risk factors     Problem: Neurosensory - Adult  Goal: Achieves stable or improved neurological status  Outcome: Progressing  Flowsheets (Taken 11/30/2021 2219)  Achieves stable or improved neurological status:   Assess for and report changes in neurological status   Initiate measures to prevent increased intracranial pressure     Problem: Respiratory - Adult  Goal: Achieves optimal ventilation and oxygenation  Outcome: Progressing  Flowsheets (Taken 11/30/2021 2219)  Achieves optimal ventilation and oxygenation:   Assess for changes in respiratory status   Assess for changes in mentation and behavior     Problem: Cardiovascular - Adult  Goal: Maintains optimal cardiac output and hemodynamic stability  Outcome: Progressing  Flowsheets (Taken 11/30/2021  2219)  Maintains optimal cardiac output and hemodynamic stability:   Monitor blood pressure and heart rate   Monitor urine output and notify Licensed Independent Practitioner for values outside of normal range   Assess for signs of decreased cardiac output     Problem: Skin/Tissue Integrity - Adult  Goal: Skin integrity remains intact  Outcome: Progressing  Flowsheets (Taken 11/30/2021 2219)  Skin Integrity Remains Intact:   Monitor for areas of redness and/or skin breakdown   Assess vascular access sites hourly     Problem: Genitourinary - Adult  Goal: Absence of urinary retention  Outcome: Progressing  Flowsheets (Taken 11/30/2021 2219)  Absence of urinary retention:   Assess patient's ability to void and empty bladder   Monitor intake/output and perform bladder scan as needed     Problem: Discharge Planning  Goal: Discharge to home or other facility with appropriate resources  Outcome: Progressing  Flowsheets (Taken 11/30/2021 2219)  Discharge to home or other facility with appropriate resources:   Identify barriers to discharge with patient and caregiver   Arrange for needed discharge resources and transportation as appropriate   Identify discharge learning needs (meds, wound care, etc)     Care plan reviewed with patient.  Patient verbalizes understanding of the care plan and contributed to goal setting.

## 2021-11-30 NOTE — Procedures (Signed)
EKG was handed to [facility], DO.

## 2021-11-30 NOTE — Care Coordination-Inpatient (Signed)
Case Management Assessment  Initial Evaluation    Date/Time of Evaluation: 11/30/2021 11:54 AM  Assessment Completed by: Marcus Seeds, RN    If patient is discharged prior to next notation, then this note serves as note for discharge by case management.    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 Yes    Chart Reviewed: Yes      History Provided by: Patient  Patient Orientation: Alert and Oriented    Patient Cognition: Alert    Hospitalization in the last 30 days (Readmission):  No    If yes, Readmission Assessment in CM Navigator will be completed.    Advance Directives:      Code Status: Limited   Patient's Primary Decision Maker is: Named in [place]    Primary Decision Maker: Marcus Burnett - Spouse - [phone]    Discharge Planning:    Patient lives with: Spouse/Significant Other, Family Members Type of Home: House  Primary Care Giver: Self  Patient Support Systems include: Spouse/Significant Other, Family Members, Children, Parent   Current Financial resources: Medicaid  Current community resources: None  Current services prior to admission: None            Current DME:              Type of Home Care services:  None    ADLS  Prior functional level: Assistance with the following:, Shopping  Current functional level: Shopping, Assistance with the following:    Family can provide assistance at DC: Yes  Would you like Case Management to discuss the discharge plan with any other family members/significant others, and if so, who? No  Plans to Return to Present Housing: Yes  Other Identified Issues/Barriers to RETURNING to current housing:  None  Potential Assistance needed at discharge: N/A            Potential DME:    Patient expects to discharge to: [place] for transportation at discharge: Family    Financial    Payor: [place] / Plan: [place] / Product Type: *No Product type* /     Does insurance require precert for SNF: Yes    Potential assistance Purchasing Medications: No  Meds-to-Beds request: Yes      [place], [place] [address] [place] [phone] Wanda Plump [phone]  [address] [place] [address]  Phone: [phone] Fax: [phone]      Notes:    Factors facilitating achievement of predicted outcomes: Family support, Motivated, Cooperative, Pleasant, Sense of humor, Good insight into deficits, and Has needed Durable Medical Equipment at home    Barriers to discharge: Medical stability  MRI of brain:  1. Significant interval deterioration since previous study dated 10/11/2021 with a new 1.6 x 1.2 cm enhancing lesion in the left parietal cortex with extensive surrounding edema.  2.  Enlargement of lesion in the left temporal lobe which measures 1.6 x 1.3 cm in size. There is surrounding edema.  3. Enlargement of the lesion in the left cerebellar hemisphere which measures 1.3 x 1.2 cm in size. There is surrounding edema.  4. No change in the 1.3 x 0.8 cm lesion in the left frontal lobe and areas of abnormal signal intensity in the right temporal lobe and right occipital lobe which may represent treated metastases.  5. Mild atrophy and probable ischemic changes in the white matter.  6. Mucosal in the frontal sinus.  7. Inflammatory changes in the ethmoid air cells and maxillary sinuses bilaterally.    Additional Case Management Notes: Patient presents with right sided weakness. Patient has a history of bladder cancer with brain met's. Creatinine 1.3. Neurosurgery consult, IV Decadron q 6 hr., ISS, prn Tylenol and Zofran, BMP and CBC daily, MRI of lumbar spine,  Palliative Care, regular diet, SCD's, up with assistance.     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 He resides at home with his wife and mother-in-law. Marcus Burnett and his wife moved to [place] from [place] a little over a year age. Insurance and PCP verified. He is able to afford his medications and has access to a walker if needed. He will have transportation to home and denies a need for services at discharge. Following for potential needs.   Transportation/Food Security/Housekeeping Addressed: No issues identified.     Marcus Seeds, RN  Case Management Department

## 2021-11-30 NOTE — Progress Notes (Signed)
Internal Medicine Resident Progress Note    Name: Marcus Burnett, male, DOB: December 20, 1960, MRN: [id number]    PCP: [name], MD    Date of Admission: 11/29/2021  Date of Service: Pt seen/examined on 11/30/21      Assessment/Plan:  Metastatic disease to brain: MRI brain reviewed showed new lesion in the left parietal region measuring 1.6 x 1.2 cm with extensive surrounding edema.  Enlargement of the left upper arm left cerebellar lesion.  Patient recently underwent craniotomy July 31, 2021 for left-sided tumor.  Continue Decadron 4 mg IV every 6 hours.  Continue Keppra for seizure prophylaxis  Neurosurgery consulted, awaiting input  Patient at this time states that he would like to follow-up with radiation oncology. Place consult in a.m.  Or outpatient follow-up.  Renal cell carcinoma: Patient is status post nephrectomy in 2022  Non-insulin-dependent diabetes: Last A1c 07/24/2021 6.9.  Blood sugars in the secondary to steroids.  Takes metformin,Trulicity, glipizide at home.  Hold oral hypoglycemics.  Start on medium dose sliding scale insulin we will adjust when steroids stopped.  Hypertension: Continue with lisinopril  History of hyperlipidemia: Continue home pravastatin  Hyperkalemia: Repeat labs show potassium of 5.6, no EKG changes.  Given one-time dose of insulin and dextrose.  Repeat potassium.        Expected discharge date: TBD    Disposition:   '[x]'$  Home  '[]'$  Inpatient Rehab  '[]'$  Psychiatric Unit  '[]'$  SNF  '[]'$  [place]  '[]'$  Other-    ===================================================================      Chief Complaint: Right-sided weakness    Hospital Course:   The patient is a 61 y.o. male who presents with complaints of worsening right-sided weakness.  Patient reports that 2 weeks ago he was perfectly fine and zip lining with his 18 year old mother.  4 days later patient reports he was walking on the beach and was trying to put his shoe on but fell and was unable to get back up.  He  states hat he has been having to drag his right foot.  He reports 1 out of 10 headache.  Denies any photophobia, nausea, or dizziness.  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  Patient is status post craniotomy with resection of left-sided tumors on July 31, 2021.     Today patient had an outpatient MRI which confirmed worsening metastatic disease of the brain with surrounding edema.  Patient states that he has an appointment tomorrow with neurosurgery and with oncology.  They report he sees [name] who holds office here once a month.  At this time patient states that he would like a appointment with radiation oncology.    Subjective (past 24 hours): Patient denies any response, nausea, vomiting.  Still having some right-sided weakness    ROS: reviewed complete ROS unchanged unless otherwise stated in hospital course/subjective portion.       Medications:  Reviewed    insulin lispro, 0-16 Units, SubCUTAneous, TID WC    insulin lispro, 0-4 Units, SubCUTAneous, Nightly    insulin regular, 10 Units, IntraVENous, Once **AND** dextrose bolus, 250 mL, IntraVENous, Once **AND** POCT Glucose, , , Q30 Min **AND** POCT Glucose, , , Q1H **AND** POCT Glucose, , , 4x Daily AC & HS    sodium chloride flush, 5-40 mL, IntraVENous, 2 times per day    dexamethasone, 4 mg, IntraVENous, Q6H    lisinopril, 40 mg, Oral, Daily  Intake/Output Summary (Last 24 hours) at 11/30/2021 1853  Last data filed at 11/30/2021 1601  Gross per 24 hour   Intake 710 ml   Output 900 ml   Net -190 ml       Exam:  BP 137/72   Pulse 75   Temp 98.1 F (36.7 C) (Oral)   Resp 18   Ht '5\' 10"'$  (1.778 m)   Wt 268 lb 1.3 oz (121.6 kg)   SpO2 91%   BMI 38.47 kg/m     General: No distress, appears stated age.  Eyes:  PERRL. Conjunctivae/corneas clear.  HENT: Head normal appearing. Nares normal. Oral mucosa moist.  Hearing intact.   Neck: Supple, with full range of motion. Trachea midline.  No gross JVD  appreciated.  Respiratory:  Normal effort. Clear to auscultation, without rales or wheezes or rhonchi.  Cardiovascular: Normal rate, regular rhythm with normal S1/S2 without murmurs.    No lower extremity edema.   Abdomen: Soft, non-tender, non-distended with normal bowel sounds.  Musculoskeletal: No joint swelling or tenderness. Normal tone. No abnormal movements.   Skin: Warm and dry. No rashes or lesions.  Neurologic:  No focal sensory/motor deficits in the upper or lower extremities. Cranial nerves:  grossly non-focal 2-12.     Psychiatric: Alert and oriented, normal insight and thought content.   Capillary Refill: Brisk,< 3 seconds.  Peripheral Pulses: +2 palpable, equal bilaterally.       Labs:   Recent Labs     11/29/21  1145 11/30/21  0814   WBC 8.2 9.9   HGB 13.3* 13.6*   HCT 41.6* 42.7   PLT 313 362     Recent Labs     11/29/21  1145 11/30/21  0814   NA 133* 134*   K 4.6 5.6*   CL 99 97*   CO2 24 21*   BUN 24* 19   CREATININE 1.3* 1.2   CALCIUM 9.4 10.3     No results for input(s): AST, ALT, BILIDIR, BILITOT, ALKPHOS in the last 72 hours.  No results for input(s): INR in the last 72 hours.  No results for input(s): TROPONINT in the last 72 hours.  No results for input(s): PROCAL in the last 72 hours.   Lab Results   Component Value Date/Time    NITRU NEGATIVE 07/23/2021 06:00 PM    BLOODU NEGATIVE 07/23/2021 06:00 PM    GLUCOSEU NEGATIVE 07/23/2021 06:00 PM       Radiology:  see assessment and plan for discussion of pertinent imaging.       DVT prophylaxis:    '[]'$  Lovenox  '[]'$  SCDs  '[]'$  SQ Heparin  '[]'$  Encourage ambulation   '[]'$  Already on Anticoagulation  Diet: ADULT DIET; Regular; 3 carb choices (45 gm/meal)  Code Status: Limited  PT/OT: Yes  Tele: Yes  IVF: No    Electronically signed by Brock Bad, DO PGY1 on 11/30/2021 at 6:53 PM    Case was discussed with Attending, Dr. Daralene Milch
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
## 2021-12-01 LAB — URINALYSIS WITH MICROSCOPIC
Bacteria, UA: NONE SEEN
Bilirubin Urine: NEGATIVE
Blood, Urine: NEGATIVE
Casts: NONE SEEN /lpf
Casts: NONE SEEN /lpf
Crystals: NONE SEEN
Epithelial Cells, UA: NONE SEEN /hpf (ref 3–?)
Glucose, Urine: 500 mg/dl — AB
Ketones, Urine: NEGATIVE
Leukocyte Esterase, Urine: NEGATIVE
Miscellaneous Lab Test Result: NONE SEEN
Nitrite, Urine: NEGATIVE
Protein, UA: NEGATIVE mg/dl
RBC, UA: NONE SEEN /hpf (ref 0–?)
Renal Epithelial, UA: NONE SEEN
Specific Gravity, UA: 1.014 (ref 1.002–1.030)
Urobilinogen, Urine: 0.2 eu/dl (ref 0.0–1.0)
WBC, UA: NONE SEEN /hpf (ref 0–?)
Yeast, UA: NONE SEEN
pH, UA: 6 (ref 5.0–9.0)

## 2021-12-01 LAB — CBC
Hematocrit: 39.5 % — ABNORMAL LOW (ref 42.0–52.0)
Hemoglobin: 13 gm/dl — ABNORMAL LOW (ref 14.0–18.0)
MCH: 28.1 pg (ref 26.0–33.0)
MCHC: 32.9 gm/dl (ref 32.2–35.5)
MCV: 85.3 fL (ref 80.0–94.0)
MPV: 10.5 fL (ref 9.4–12.4)
Platelets: 344 10*3/uL (ref 130–400)
RBC: 4.63 10*6/uL — ABNORMAL LOW (ref 4.70–6.10)
RDW-CV: 13.9 % (ref 11.5–14.5)
RDW-SD: 42.7 fL (ref 35.0–45.0)
WBC: 9.1 10*3/uL (ref 4.8–10.8)

## 2021-12-01 LAB — UREA NITROGEN, URINE: Urea Nitrogen, Ur: 640 mg/dl

## 2021-12-01 LAB — BASIC METABOLIC PANEL
BUN: 24 mg/dL — ABNORMAL HIGH (ref 7–22)
CO2: 18 meq/L — ABNORMAL LOW (ref 23–33)
Calcium: 9.6 mg/dL (ref 8.5–10.5)
Chloride: 100 meq/L (ref 98–111)
Creatinine: 1.4 mg/dL — ABNORMAL HIGH (ref 0.4–1.2)
Glucose: 237 mg/dL — ABNORMAL HIGH (ref 70–108)
Potassium: 4.4 meq/L (ref 3.5–5.2)
Sodium: 133 meq/L — ABNORMAL LOW (ref 135–145)

## 2021-12-01 LAB — CREATININE, RANDOM URINE: Creatinine, Urine: 61 mg/dl

## 2021-12-01 LAB — OSMOLALITY, URINE: Osmolality, Ur: 433 mosmol/kg (ref 250–750)

## 2021-12-01 LAB — POTASSIUM
Potassium: 4.9 meq/L (ref 3.5–5.2)
Potassium: 5.1 meq/L (ref 3.5–5.2)

## 2021-12-01 LAB — HEMOGLOBIN A1C
AVERAGE GLUCOSE: 168 mg/dL — ABNORMAL HIGH (ref 70–126)
Hemoglobin A1C: 7.6 % — ABNORMAL HIGH (ref 4.4–6.4)

## 2021-12-01 LAB — GLOMERULAR FILTRATION RATE, ESTIMATED: Est, Glom Filt Rate: 57 mL/min/{1.73_m2} — AB (ref >60)

## 2021-12-01 LAB — POCT GLUCOSE
POC Glucose: 220 mg/dl — ABNORMAL HIGH (ref 70–108)
POC Glucose: 267 mg/dl — ABNORMAL HIGH (ref 70–108)
POC Glucose: 275 mg/dl — ABNORMAL HIGH (ref 70–108)
POC Glucose: 359 mg/dl — ABNORMAL HIGH (ref 70–108)

## 2021-12-01 LAB — ANION GAP: Anion Gap: 15 meq/L (ref 8.0–16.0)

## 2021-12-01 LAB — SODIUM, URINE, RANDOM: Sodium, Ur: 60 meq/l

## 2021-12-01 MED ORDER — INSULIN LISPRO 100 UNIT/ML IJ SOLN
100 UNIT/ML | Freq: Four times a day (QID) | INTRAMUSCULAR | Status: DC
Start: 2021-12-01 — End: 2021-12-02
  Administered 2021-12-01 – 2021-12-02 (×4): 5 [IU] via SUBCUTANEOUS

## 2021-12-01 MED ORDER — INSULIN LISPRO 100 UNIT/ML IJ SOLN
100 | Freq: Three times a day (TID) | INTRAMUSCULAR | Status: DC
Start: 2021-12-01 — End: 2021-12-01

## 2021-12-01 MED ORDER — SODIUM CHLORIDE 0.9 % IV BOLUS
0.9 % | Freq: Once | INTRAVENOUS | Status: AC
Start: 2021-12-01 — End: 2021-12-01
  Administered 2021-12-01: 14:00:00 500 mL via INTRAVENOUS

## 2021-12-01 MED FILL — DEXAMETHASONE SODIUM PHOSPHATE 4 MG/ML IJ SOLN: 4 MG/ML | INTRAMUSCULAR | Qty: 1

## 2021-12-01 MED FILL — HUMALOG 100 UNIT/ML IJ SOLN: 100 UNIT/ML | INTRAMUSCULAR | Qty: 4

## 2021-12-01 MED FILL — HUMALOG 100 UNIT/ML IJ SOLN: 100 UNIT/ML | INTRAMUSCULAR | Qty: 8

## 2021-12-01 MED FILL — HUMALOG 100 UNIT/ML IJ SOLN: 100 UNIT/ML | INTRAMUSCULAR | Qty: 5

## 2021-12-01 MED FILL — PRAVASTATIN SODIUM 80 MG PO TABS: 80 MG | ORAL | Qty: 1

## 2021-12-01 MED FILL — HUMALOG 100 UNIT/ML IJ SOLN: 100 UNIT/ML | INTRAMUSCULAR | Qty: 12

## 2021-12-01 NOTE — Plan of Care (Signed)
Problem: Chronic Conditions and Co-morbidities  Goal: Patient's chronic conditions and co-morbidity symptoms are monitored and maintained or improved  Outcome: Progressing  Flowsheets (Taken 11/30/2021 2219 by Frederich Balding, RN)  Care Plan - Patient's Chronic Conditions and Co-Morbidity Symptoms are Monitored and Maintained or Improved:   Monitor and assess patient's chronic conditions and comorbid symptoms for stability, deterioration, or improvement   Collaborate with multidisciplinary team to address chronic and comorbid conditions and prevent exacerbation or deterioration     Problem: Safety - Adult  Goal: Free from fall injury  Outcome: Progressing  Flowsheets (Taken 11/30/2021 2219 by Frederich Balding, RN)  Free From Fall Injury:   Instruct family/caregiver on patient safety   Based on caregiver fall risk screen, instruct family/caregiver to ask for assistance with transferring infant if caregiver noted to have fall risk factors     Problem: Neurosensory - Adult  Goal: Achieves stable or improved neurological status  Outcome: Progressing  Flowsheets (Taken 11/30/2021 2219 by Frederich Balding, RN)  Achieves stable or improved neurological status:   Assess for and report changes in neurological status   Initiate measures to prevent increased intracranial pressure  Goal: Absence of seizures  Outcome: Progressing  Goal: Remains free of injury related to seizures activity  Outcome: Progressing  Goal: Achieves maximal functionality and self care  Outcome: Progressing     Problem: Respiratory - Adult  Goal: Achieves optimal ventilation and oxygenation  Outcome: Progressing  Flowsheets (Taken 11/30/2021 2219 by Frederich Balding, RN)  Achieves optimal ventilation and oxygenation:   Assess for changes in respiratory status   Assess for changes in mentation and behavior     Problem: Cardiovascular - Adult  Goal: Maintains optimal cardiac output and hemodynamic stability  Outcome: Progressing  Flowsheets (Taken  11/30/2021 2219 by Frederich Balding, RN)  Maintains optimal cardiac output and hemodynamic stability:   Monitor blood pressure and heart rate   Monitor urine output and notify Licensed Independent Practitioner for values outside of normal range   Assess for signs of decreased cardiac output  Goal: Absence of cardiac dysrhythmias or at baseline  Outcome: Progressing     Problem: Skin/Tissue Integrity - Adult  Goal: Skin integrity remains intact  Outcome: Progressing  Flowsheets (Taken 11/30/2021 2219 by Frederich Balding, RN)  Skin Integrity Remains Intact:   Monitor for areas of redness and/or skin breakdown   Assess vascular access sites hourly  Goal: Incisions, wounds, or drain sites healing without S/S of infection  Outcome: Progressing  Goal: Oral mucous membranes remain intact  Outcome: Progressing     Problem: Genitourinary - Adult  Goal: Absence of urinary retention  Outcome: Progressing  Flowsheets (Taken 11/30/2021 2219 by Frederich Balding, RN)  Absence of urinary retention:   Assess patient's ability to void and empty bladder   Monitor intake/output and perform bladder scan as needed     Problem: Discharge Planning  Goal: Discharge to home or other facility with appropriate resources  Outcome: Progressing  Flowsheets (Taken 11/30/2021 2219 by Frederich Balding, RN)  Discharge to home or other facility with appropriate resources:   Identify barriers to discharge with patient and caregiver   Arrange for needed discharge resources and transportation as appropriate   Identify discharge learning needs (meds, wound care, etc)

## 2021-12-01 NOTE — Progress Notes (Signed)
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 Enlargement of the left upper arm left cerebellar lesion. Patient recently underwent craniotomy July 31, 2021 for left-sided tumor. Patient complained of having some leg spasms has a known lytic lesion in lumbar spine since 07/24/21. Repeat MRI lumbar spine 11/30/21 showed small sclerotic focus in L2 vertebral body unchanged from previous scan.   Continue Decadron 4 mg IV every 6 hours.  Continue Keppra for seizure prophylaxis  Neurosurgery consulted, awaiting input  Patient at this time states that he would like to follow-up with radiation oncology. May have to be outpatient as Dr. Lyndel Burnett who he follows with outpatient is currently on paternity leave.   AKI: Likely secondary to poor oral intake. Cr baseline is 1. Today Cr level 1.4. Initial Cr 1.3 on arrival.   a. Urine studies and UA ordered   b. 500 ml/hr bolus orderd  c. Will hold Lisinopril  d. Avoid nephrotoxic medications, renally dose medications   e. Continue to monitor with daily BMP  Renal cell carcinoma: Patient is status post left nephrectomy on 10/2020.   Non-insulin-dependent diabetes: Last A1c 07/24/2021 6.9.  Blood sugars in the secondary to steroids. Takes metformin,Trulicity, glipizide at home.  Hold oral hypoglycemics.  Switched from medium to high dose sliding scale insulin as patient is on steroids currently.   Hypoglycemia protocol in place   Order hemoglobin A1c level   Hypertension: Patient on lisinopril at home. Will hold for now as of AKI.   History of hyperlipidemia: Continue home pravastatin  Hyperkalemia: Potassium of 5.6 noted on 11/30/21. No EKG changes.  Repeat potassium 5.1.   a. Continue to monitor with daily BMP    Expected discharge date:  TBD    Disposition:    '[x]'$  Home       '[]'$  TCU       '[]'$  Rehab       '[]'$  Psych       '[]'$  SNF       '[]'$  [facility]       '[]'$  Other-    Chief Complaint: Right-sided weakness    Hospital Course:   Marcus Burnett is a 61 y.o. male who presents with past medical history of renal cell carcinoma s/p nephrectomy in 2022, diabetes mellitus, HTN, and HLD who presented to [facility] on 11/29/21 with complaints of worsening right-sided weakness.  Patient reports that 2 weeks ago he was perfectly fine and zip lining with his 52 year old mother.  4 days later patient reports he was walking on the beach and was trying to put his shoe on but fell and was unable to get back up.  He states hat he has been having to drag his right foot.  He reports 1 out of 10 headache.  Denies any photophobia, nausea, or dizziness.  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  Patient is status post craniotomy with resection of left-sided tumors on July 31, 2021.     Today patient had an outpatient MRI which confirmed worsening metastatic  disease of the brain with surrounding edema.  Patient states that he has an appointment tomorrow with neurosurgery and with oncology.  They report he sees Marcus Burnett who holds office here once a month.  At this time patient states that he would like a appointment with radiation oncology.     Subjective (past 24 hours):   Patient seen and examined today. He is resting comfortably in chair. States he feels good. He denies having any chest pain, abdominal pain, nausea, vomiting, fever, or chills. Awaiting neurosurgery input. Patient would like to see Radiation Oncology however Dr. Lyndel Burnett who he normally follows with is out for paternity leave.       ROS (12 point review of systems completed.  Pertinent positives noted. Otherwise ROS is negative).    Medications:  Reviewed    Infusion  Medications    dextrose      sodium chloride      dextrose       Scheduled Medications    insulin lispro  0-16 Units SubCUTAneous TID WC    insulin lispro  0-4 Units SubCUTAneous Nightly    insulin regular  10 Units IntraVENous Once    And    dextrose bolus  250 mL IntraVENous Once    pravastatin  80 mg Oral Daily    sodium chloride flush  5-40 mL IntraVENous 2 times per day    dexamethasone  4 mg IntraVENous Q6H    [Held by provider] lisinopril  40 mg Oral Daily     PRN Meds: glucose, dextrose bolus **OR** dextrose bolus, glucagon (rDNA), dextrose, sodium chloride flush, sodium chloride, ondansetron **OR** ondansetron, polyethylene glycol, acetaminophen **OR** acetaminophen, dextrose bolus **OR** dextrose bolus, glucagon (rDNA), dextrose      Intake/Output Summary (Last 24 hours) at 12/01/2021 1532  Last data filed at 12/01/2021 1441  Gross per 24 hour   Intake 880 ml   Output 1100 ml   Net -220 ml       Diet:  ADULT DIET; Regular; 3 carb choices (45 gm/meal); Low Potassium (Less than 3000 mg/day)    Exam:  BP (!) 143/79   Pulse 63   Temp 98.2 F (36.8 C) (Oral)   Resp 18   Ht '5\' 10"'$  (1.778 m)   Wt 266 lb 12.8 oz (121 kg)   SpO2 96%   BMI 38.28 kg/m     General appearance: No apparent distress, appears stated age and cooperative.  HEENT: Pupils equal, round, and reactive to light. Conjunctivae/corneas clear.  Neck: Supple, with full range of motion. No jugular venous distention. Trachea midline.  Respiratory:  Normal respiratory effort. Clear to auscultation, bilaterally without Rales/Wheezes/Rhonchi.  Cardiovascular: Regular rate and rhythm with normal S1/S2 without murmurs, rubs or gallops.  Abdomen: Soft, non-tender, non-distended with normal bowel sounds.  Musculoskeletal: passive and active ROM x 4 extremities.  Skin: Skin color, texture, turgor normal.  No rashes or lesions.  Neurologic:  Neurovascularly intact without any focal sensory/motor deficits. Cranial nerves: II-XII intact, grossly  non-focal.  Psychiatric: Alert and oriented, thought content appropriate, normal insight  Capillary Refill: Brisk,< 3 seconds   Peripheral Pulses: +2 palpable, equal bilaterally       Labs:   Recent Labs     11/29/21  1145 11/30/21  0814 12/01/21  0423   WBC 8.2 9.9 9.1   HGB 13.3* 13.6* 13.0*   HCT 41.6* 42.7 39.5*   PLT 313 362 344     Recent Labs     11/29/21  1145 11/30/21  0814 11/30/21  1938 11/30/21  2310 12/01/21  0423   NA 133* 134*  --   --  133*   K 4.6 5.6* 5.1 4.9 4.4   CL 99 97*  --   --  100   CO2 24 21*  --   --  18*   BUN 24* 19  --   --  24*   CREATININE 1.3* 1.2  --   --  1.4*   CALCIUM 9.4 10.3  --   --  9.6     No results for input(s): AST, ALT, BILIDIR, BILITOT, ALKPHOS in the last 72 hours.  No results for input(s): INR in the last 72 hours.  No results for input(s): CKTOTAL, TROPONINI in the last 72 hours.    Microbiology:      Urinalysis:      Lab Results   Component Value Date/Time    NITRU NEGATIVE 07/23/2021 06:00 PM    BLOODU NEGATIVE 07/23/2021 06:00 PM    GLUCOSEU NEGATIVE 07/23/2021 06:00 PM       Radiology:  MRI LUMBAR SPINE W WO CONTRAST   Final Result       1. There is a small sclerotic focus in the L2 vertebral body unchanged since previous CT scan of the abdomen dated 26 September 2021 and MRI scan of the lumbar spine dated 07/24/2021.   2. There is some ventral and left-sided disc protrusion at L5-S1 resulting in mild canal, moderate left and mild-to-moderate right-sided foraminal stenosis.   3. There is mild canal and mild-to-moderate bilateral foraminal stenosis at L4-5.   4. There is degenerative change in the sacroiliac joints bilaterally.               **This report has been created using voice recognition software. It may contain minor errors which are inherent in voice recognition technology.**      Final report electronically signed by DR Drue Flirt on 11/30/2021 1:05 PM          DVT prophylaxis: '[]'$  Lovenox                                 '[x]'$  SCDs                                  '[]'$  SQ Heparin                                 '[]'$  Encourage ambulation           '[]'$  Already on Anticoagulation     Code Status: Limited    PT/OT Eval Status: Following    Tele:   '[]'$  yes             '[x]'$  no    Electronically signed by Jaclynn Major, DO on 12/01/2021 at 3:32 PM

## 2021-12-02 LAB — BASIC METABOLIC PANEL
BUN: 34 mg/dL — ABNORMAL HIGH (ref 7–22)
BUN: 39 mg/dL — ABNORMAL HIGH (ref 7–22)
CO2: 18 meq/L — ABNORMAL LOW (ref 23–33)
CO2: 17 meq/L — ABNORMAL LOW (ref 23–33)
Calcium: 9.1 mg/dL (ref 8.5–10.5)
Calcium: 9.8 mg/dL (ref 8.5–10.5)
Chloride: 103 meq/L (ref 98–111)
Chloride: 103 meq/L (ref 98–111)
Creatinine: 1.5 mg/dL — ABNORMAL HIGH (ref 0.4–1.2)
Creatinine: 1.4 mg/dL — ABNORMAL HIGH (ref 0.4–1.2)
Glucose: 298 mg/dL — ABNORMAL HIGH (ref 70–108)
Glucose: 262 mg/dL — ABNORMAL HIGH (ref 70–108)
Potassium: 4.7 meq/L (ref 3.5–5.2)
Potassium: 4.7 meq/L (ref 3.5–5.2)
Sodium: 136 meq/L (ref 135–145)
Sodium: 136 meq/L (ref 135–145)

## 2021-12-02 LAB — POCT GLUCOSE
POC Glucose: 288 mg/dl — ABNORMAL HIGH (ref 70–108)
POC Glucose: 291 mg/dl — ABNORMAL HIGH (ref 70–108)

## 2021-12-02 LAB — HEMOGLOBIN AND HEMATOCRIT
Hematocrit: 40.5 % — ABNORMAL LOW (ref 42.0–52.0)
Hematocrit: 43.7 % (ref 42.0–52.0)
Hemoglobin: 13.4 gm/dl — ABNORMAL LOW (ref 14.0–18.0)
Hemoglobin: 14.4 gm/dl (ref 14.0–18.0)

## 2021-12-02 LAB — CBC
Hematocrit: 38.8 % — ABNORMAL LOW (ref 42.0–52.0)
Hemoglobin: 12.8 gm/dl — ABNORMAL LOW (ref 14.0–18.0)
MCH: 28.3 pg (ref 26.0–33.0)
MCHC: 33 gm/dl (ref 32.2–35.5)
MCV: 85.8 fL (ref 80.0–94.0)
MPV: 10.4 fL (ref 9.4–12.4)
Platelets: 331 10*3/uL (ref 130–400)
RBC: 4.52 10*6/uL — ABNORMAL LOW (ref 4.70–6.10)
RDW-CV: 14 % (ref 11.5–14.5)
RDW-SD: 43.2 fL (ref 35.0–45.0)
WBC: 12.2 10*3/uL — ABNORMAL HIGH (ref 4.8–10.8)

## 2021-12-02 LAB — GLOMERULAR FILTRATION RATE, ESTIMATED
Est, Glom Filt Rate: 52 mL/min/{1.73_m2} — AB (ref >60)
Est, Glom Filt Rate: 57 mL/min/{1.73_m2} — AB (ref >60)

## 2021-12-02 LAB — ANION GAP
Anion Gap: 15 meq/L (ref 8.0–16.0)
Anion Gap: 16 meq/L (ref 8.0–16.0)

## 2021-12-02 MED ORDER — SODIUM CHLORIDE 0.9 % IV SOLN
0.9 % | INTRAVENOUS | Status: DC
Start: 2021-12-02 — End: 2021-12-02

## 2021-12-02 MED ORDER — AMLODIPINE BESYLATE 5 MG PO TABS
5 MG | ORAL_TABLET | Freq: Every day | ORAL | 0 refills | Status: DC
Start: 2021-12-02 — End: 2021-12-02

## 2021-12-02 MED ORDER — AMLODIPINE BESYLATE 5 MG PO TABS
5 MG | ORAL_TABLET | Freq: Every day | ORAL | 0 refills | Status: AC
Start: 2021-12-02 — End: ?

## 2021-12-02 MED ORDER — HYDRALAZINE HCL 20 MG/ML IJ SOLN
20 | Freq: Once | INTRAMUSCULAR | Status: DC
Start: 2021-12-02 — End: 2021-12-02

## 2021-12-02 MED FILL — DEXAMETHASONE SODIUM PHOSPHATE 4 MG/ML IJ SOLN: 4 MG/ML | INTRAMUSCULAR | Qty: 1

## 2021-12-02 MED FILL — HUMALOG 100 UNIT/ML IJ SOLN: 100 UNIT/ML | INTRAMUSCULAR | Qty: 5

## 2021-12-02 MED FILL — HUMALOG 100 UNIT/ML IJ SOLN: 100 UNIT/ML | INTRAMUSCULAR | Qty: 8

## 2021-12-02 NOTE — Progress Notes (Signed)
Physician Progress Note      PATIENT:               Marcus Burnett, Marcus Burnett  CSN #:                  [id number]  DOB:                       1960-12-20  ADMIT DATE:       11/29/2021 10:35 AM  [place] DATE:        12/02/2021 5:06 PM  RESPONDING  PROVIDER #:        Brayton El PA-C          QUERY TEXT:    Pt admitted with Metastatic disease to the brain: MRI brain reviewed shows new   lesion in the left parietal region measuring 1.6 x 1.2 cm with extensive   surrounding edema.  Enlargement of the left temporal and left cerebellar   lesion.  We will start patient on Decadron 4 mg IV every 6 hours.  Continue   with Keppra for seizure prophylaxis.  Neurosurgery consulted.  If clinically   significant, please document in progress notes and discharge summary if you   are evaluating/treating any of the following:    The medical record reflects the following:  Risk Factors: MRI brain reviewed shows new lesion in the left parietal region   measuring 1.6 x 1.2 cm with extensive surrounding edema.  Enlargement of the   left temporal and left cerebellar lesion.  Clinical Indicators: Significant interval deterioration since previous study   dated 10/11/2021 with a new 1.6 x 1.2 cm enhancing lesion in the left parietal   cortex with extensive surrounding edema.  Enlargement of lesion in the left temporal lobe which measures 1.6 x 1.3 cm in   size. There is surrounding edema.  Enlargement of the lesion in the left cerebellar hemisphere which measures 1.3   x 1.2 cm in size. There is surrounding edema.  Treatment:  Decadron 4 mg IV every 6 hours.  Continue with Keppra for seizure   prophylaxis.  Neurosurgery consulted.    Thank you.  Michelene Gardener, RN, Clinical Documentation Integrity, [employer], [facility], CRCR  Options provided:  -- Cerebral edema  -- Brain compression  -- Cerebral edema and Brain compression  -- Traumatic brain compression  -- Other - I will add my own diagnosis  -- Disagree - Not applicable / Not valid  --  Disagree - Clinically unable to determine / Unknown  -- Refer to Clinical Documentation Reviewer    PROVIDER RESPONSE TEXT:    This patient has cerebral edema and brain compression.    Query created by: Harle Battiest on 12/04/2021 8:22 AM      Electronically signed by:  Brayton El PA-C 12/04/2021 9:14 AM

## 2021-12-02 NOTE — Progress Notes (Addendum)
Addendum by Dr. Reyne Dumas, MD:  I have seen and examined the patient independently. Face to face evaluation and examination were performed.   The below evaluation and note have been reviewed. Labs and radiographs were reviewed.   I Have discussed with Neurosurgery PA/ NP about this patient in detail.  Time spent with patient 35 minutes.  Time could have been discontiguous. Time does not include procedures.  Time does include my direct assessment of the patient and coordination of care.  100% decision making by myself. Time represents more than 50% of the time involved with patient care by the neurosurgical team  I agree with below assessment and plan.  Please see my modifications mentioned below.      My additional comments and modifications:     The patient's new brain MRI showed an interval development of multiple metastatic disease in the deep locations.  At this time I do not believe that the patient is a good candidate for any surgical intervention.  I would recommend radiation and medical oncology consultations and palliative care consultation.  However the patient is still refusing any radiation therapy.   I discussed the case in detail with the patient and his wife.  Also discussed with him the recommendation and treatment plan from neurosurgical perspectives.    Also discussed the case in detail with the hospitalist service.  From this time on, neurosurgery team will see this patient only as needed as long as she is in the hospital. Please call if you have any further questions or concerns regarding this patient.  Patient needs to follow-up neurosurgery outpatient clinic only as needed from this time I will also.    Reyne Dumas, MD      Neurosurgery Progress Note    Patient:  Marcus Burnett      Unit/Bed:5K-12/012-A    Date of Birth: 1961-01-03    MRN: [id number]     Acct: [id number]     Admit date: 11/29/2021    Chief Complaint   Patient presents with    Fatigue       Patient Seen, Chart, Physician notes,  Labs, Radiology studies reviewed.    Subjective: Marcus Burnett is seen and evaluated on the floor with valuation and exam findings reviewed and discussed with Dr. Annamarie Major and with nursing.  He is resting comfortably with pain very well controlled.        Past, Family, Social History unchanged from admission.    Diet:  ADULT DIET; Regular; 3 carb choices (45 gm/meal); Low Potassium (Less than 3000 mg/day)    Medications:  Scheduled Meds:   insulin lispro  5 Units SubCUTAneous 4x Daily AC & HS    insulin lispro  0-16 Units SubCUTAneous TID WC    insulin lispro  0-4 Units SubCUTAneous Nightly    pravastatin  80 mg Oral Daily    sodium chloride flush  5-40 mL IntraVENous 2 times per day    dexamethasone  4 mg IntraVENous Q6H    [Held by provider] lisinopril  40 mg Oral Daily     Continuous Infusions:   dextrose      sodium chloride       PRN Meds:glucose, dextrose bolus **OR** dextrose bolus, glucagon (rDNA), dextrose, sodium chloride flush, sodium chloride, ondansetron **OR** ondansetron, polyethylene glycol, acetaminophen **OR** acetaminophen    Objective: He is observed sitting up in bed with pain very well controlled.  He presents with clear appropriate speech and participated in discussions involving his care appropriately with  purposeful movement noted x4 and obeyed commands appropriately.    Vitals: BP (!) 152/79   Pulse 58   Temp 97.5 F (36.4 C) (Oral)   Resp 18   Ht 5\' 10"  (1.778 m)   Wt 266 lb 12.8 oz (121 kg)   SpO2 100%   BMI 38.28 kg/m     Physical Exam     Physical Exam:  Alert and attentive.  Language appropriate, with no aphasia.  Pupils equal.  Facial strength symmetric.    Review of Systems   Constitutional:  Positive for activity change.   Respiratory:  Negative for apnea, chest tightness and shortness of breath.    Cardiovascular:  Negative for chest pain and leg swelling.   Neurological:  Negative for speech difficulty and headaches.   Psychiatric/Behavioral:  Negative for  agitation, behavioral problems, confusion and decreased concentration.       24 hour intake/output:  Intake/Output Summary (Last 24 hours) at 12/02/2021 1116  Last data filed at 12/02/2021 0911  Gross per 24 hour   Intake 600 ml   Output 1600 ml   Net -1000 ml     Last 3 weights:  Wt Readings from Last 3 Encounters:   12/01/21 266 lb 12.8 oz (121 kg)   10/18/21 267 lb (121.1 kg)   10/12/21 267 lb 3.2 oz (121.2 kg)         CBC:   Recent Labs     11/30/21  0814 12/01/21  0423 12/01/21  2101 12/02/21  0602   WBC 9.9 9.1  --  12.2*   HGB 13.6* 13.0* 13.4* 12.8*   PLT 362 344  --  331     BMP:    Recent Labs     11/30/21  0814 11/30/21  1938 11/30/21  2310 12/01/21  0423 12/02/21  0602   NA 134*  --   --  133* 136   K 5.6*   < > 4.9 4.4 4.7   CL 97*  --   --  100 103   CO2 21*  --   --  18* 18*   BUN 19  --   --  24* 34*   CREATININE 1.2  --   --  1.4* 1.5*   GLUCOSE 199*  --   --  237* 298*    < > = values in this interval not displayed.     Calcium:  Recent Labs     12/02/21  0602   CALCIUM 9.1     Magnesium:No results for input(s): MG in the last 72 hours.  Glucose:  Recent Labs     12/01/21  1712 12/01/21  1935 12/02/21  0745   POCGLU 267* 275* 288*     HgbA1C:   Recent Labs     12/01/21  0423   LABA1C 7.6*     Lipids: No results for input(s): CHOL, TRIG, HDL, LDL, LDLCALC in the last 72 hours.    Radiology reports as per the Radiologist  Radiology: MRI LUMBAR SPINE W WO CONTRAST    Result Date: 11/30/2021  PROCEDURE: MRI LUMBAR SPINE W WO CONTRAST CLINICAL INFORMATION: Hx of metastasis in L2, having worsening leg spasms. COMPARISON: MRI scan of the lumbar spine dated 07/24/2021. CT scan of the abdomen and pelvis dated 09/26/2021. TECHNIQUE: Sagittal and axial T1 and T2-weighted images were obtained to the lumbar spine. Postcontrast axial and sagittal T1-weighted images were also obtained. FINDINGS: The lumbar vertebral bodies are normally aligned.  There is  a small area of diminished signal intensity in the L2 vertebral  body.  This represents a small sclerotic focus. There is no bone marrow edema.  There are no compression fractures.  No pars defects are noted.  The visualized aspects of the distal spinal cord are normal. The nerve roots of the cauda equina and the tip of the conus are normal. There are no gross abnormalities in the distal thoracic spine. On the axial images, at T12-L1, there is no disc herniation, canal or foraminal stenosis. At L1-L2, there is no disc herniation, canal or foraminal stenosis. At L2-3, there is no disc herniation, canal or foraminal stenosis. At L3-4, there is no disc herniation, canal or foraminal stenosis. At L4-5, there is a 2.1 mm bulging disc and facet hypertrophy. This results in mild canal and mild-to-moderate bilateral foraminal stenosis. At L5-S1, there is a 3 mm ventral and left-sided disc protrusion and facet hypertrophy. This results in mild canal, moderate left and mild-to-moderate right-sided foraminal stenosis. There is no abnormal enhancement. There is degenerative change involving the sacroiliac joints bilaterally..      1. There is a small sclerotic focus in the L2 vertebral body unchanged since previous CT scan of the abdomen dated 26 September 2021 and MRI scan of the lumbar spine dated 07/24/2021. 2. There is some ventral and left-sided disc protrusion at L5-S1 resulting in mild canal, moderate left and mild-to-moderate right-sided foraminal stenosis. 3. There is mild canal and mild-to-moderate bilateral foraminal stenosis at L4-5. 4. There is degenerative change in the sacroiliac joints bilaterally. **This report has been created using voice recognition software. It may contain minor errors which are inherent in voice recognition technology.** Final report electronically signed by DR Sherre Poot on 11/30/2021 1:05 PM    MRI BRAIN W WO CONTRAST    Result Date: 11/29/2021  PROCEDURE: MRI BRAIN W WO CONTRAST CLINICAL INFORMATIONS/P craniotomy. 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 COMPARISON: MRI scan of the brain dated 11 October 2021.Marland Kitchen TECHNIQUE: Multiplanar and multiple spin echo T1 and T2-weighted images were obtained through the brain before and after the administration of intravenous contrast. FINDINGS: There are postoperative changes involving the left frontal, right posterior temporal/occipital calvarium. There has been significant interval deterioration. There is a new 1.6 x 1.2 cm enhancing lesion in the left parietal cortex with extensive surrounding edema. There has been enlargement of the lesion in the left temporal lobe which measures 1.6 x 1.3 cm in size. There has been enlargement of the lesions in the left cerebellar hemisphere which measure 1.3 x 1.2 cm in size. There is a 1.3 x 0.8 cm lesion in the left frontal lobe, unchanged. This may represent a treated metastatic lesion. There are areas of encephalomalacia in the right temporal lobe and right occipital lobe which may represent treated metastases. The diffusion-weighted images are otherwise normal. The brain volume is normal.There are no intra-or extra-axial collections.  There is no hydrocephalus, midline shift or mass effect. On the FLAIR and T2-weighted sequences, there is increased signal intensity in the white matter most likely representing ischemic changes. There are areas of encephalomalacia in the right frontal and right posterior temporal/occipital lobes. On the gradient echo T2-weighted images, there  susceptibility artifact in the left frontal lobe at the site of previous surgery. There is no articular abnormal enhancement in the brain. The major intracranial vascular flow voids are present.  The midline craniocervical junction structures are normal.  The brainstem and pituitary gland are normal. There is  a possible mucocele in the frontal sinus. There are inflammatory changes in ethmoid air cells and maxillary sinuses bilaterally.     1. Significant interval deterioration since previous study dated  10/11/2021 with a new 1.6 x 1.2 cm enhancing lesion in the left parietal cortex with extensive surrounding edema. 2. Enlargement of lesion in the left temporal lobe which measures 1.6 x 1.3 cm in size. There is surrounding edema. 3. Enlargement of the lesion in the left cerebellar hemisphere which measures 1.3 x 1.2 cm in size. There is surrounding edema. 4. No change in the 1.3 x 0.8 cm lesion in the left frontal lobe and areas of abnormal signal intensity in the right temporal lobe and right occipital lobe which may represent treated metastases. 5. Mild atrophy and probable ischemic changes in the white matter. 6. Mucosal in the frontal sinus. 7. Inflammatory changes in the ethmoid air cells and maxillary sinuses bilaterally. **This report has been created using voice recognition software. It may contain minor errors which are inherent in voice recognition technology.** Final report electronically signed by DR Sherre Poot on 11/29/2021 11:17 AM                   Assessment: Brain metastasis    Principal Problem:    Metastasis to brain [facility])  Resolved Problems:    * No resolved hospital problems. *        Plan: Patient is seen and evaluated on the floor with valuation exam findings reviewed and discussed with Dr. Annamarie Major and with nursing.  Patient is resting comfortably with pain well controlled and was without significant complaints during the evaluation.  This is a patient known to our service having undergone left craniotomy for safe maximal resection of brain lesion with prior right occipital craniotomy and resection of brain lesion along with right-sided craniotomy for resection of occipital and temporal tumor, with each performed by Dr. Annamarie Major, without complication.  Presents with imaging findings consistent with multiple metastatic disease.  Neurosurgery does not feel that he is a candidate for any additional neurosurgical intervention and is recommending radiation oncology and  palliative care with a follow-up with neurosurgery as scheduled.  Neurosurgery will follow this patient as needed moving forward.      Electronically signed by Dan Europe, PA-C on 12/02/2021 at 11:16 AM    Neurosurgery

## 2021-12-02 NOTE — Progress Notes (Signed)
All discharge instructions given to patient and wife with no further questions at this time. Patient discharged off unit via wheelchair. Chart contents placed in yellow bin.

## 2021-12-02 NOTE — Care Coordination-Inpatient (Signed)
12/03/21, 7:21 AM EDT    Patient goals/plan/ treatment preferences discussed by Case Manager and Social Worker.  Patient goals/plan/ treatment preferences reviewed with patient/ family.  Patient/ family verbalize understanding of discharge plan and are in agreement with goal/plan/treatment preferences.  Understanding was demonstrated using the teach back method.  AVS provided by RN at time of discharge, which includes all necessary medical information pertaining to the patients current course of illness, treatment, post-discharge goals of care, and treatment preferences.     Services At/After Discharge: None

## 2021-12-02 NOTE — Discharge Summary (Signed)
Internal Medicine Resident Discharge Summary      Patient Identification:   Marcus Burnett   DOB: [birth year]  MRN: [id number]   Account: [id number]   Patient's PCP: [name], MD    Admit Date: 11/29/2021   Discharge Date: 12/02/2021      Admitting Physician: No admitting provider for patient encounter.  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 Enlargement of the left upper arm left cerebellar lesion. Patient recently underwent craniotomy July 31, 2021 for left-sided tumor. Patient complained of having some leg spasms has a known lytic lesion in lumbar spine since 07/24/21. Repeat MRI lumbar spine 11/30/21 showed small sclerotic focus in L2 vertebral body unchanged from previous scan.   Continue Keppra for seizure prophylaxis  Neurosurgery consulted, awaiting input  Patient at this time states that he would like to follow-up with radiation oncology. May have to be outpatient as [name] who he follows with outpatient is currently on paternity leave.   AKI: Likely secondary to poor oral intake.  Improved.  Cr baseline is 1.   Hold lisinopril on discharge.  Started on amlodipine for adequate blood pressure control.  BMP in 1 week.  Follow-up with PCP prior to starting lisinopril.  Renal cell carcinoma: Patient is status post left nephrectomy on 10/2020.   Non-insulin-dependent diabetes: Last A1c 07/24/2021 6.9.  Blood sugars in the secondary to steroids. Takes metformin,Trulicity, glipizide at home.  Continue home meds  Hypertension: Started on amlodipine.  Holding lisinopril until follow-up with PCP.  Then consider stopping amlodipine  History of hyperlipidemia: Continue home pravastatin        Hospital Course:   [name] is a 61 y.o. male who presents with past medical history of renal cell carcinoma s/p nephrectomy in 2022,  diabetes mellitus, HTN, and HLD who presented to [facility] on 11/29/21 with complaints of worsening right-sided weakness.  Patient reports that 2 weeks ago he was perfectly fine and zip lining with his 65 year old mother.  4 days later patient reports he was walking [place] the [place] and was trying to put his shoe on but fell and was unable to get back up.  He states hat he has been having to drag his right foot.  He reports 1 out of 10 headache.  Denies any photophobia, nausea, or dizziness.  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  Patient is status post craniotomy with resection of left-sided tumors on July 31, 2021.     Today patient had an outpatient MRI which confirmed worsening metastatic disease of the brain with surrounding edema.  Patient states that he has an appointment tomorrow with neurosurgery and with oncology.  They report he sees [name] who holds office here once a month.  At this time patient states that he would like a appointment with radiation oncology.          The patient was seen and examined on day of discharge and this discharge summary is in conjunction with any daily progress note from day of discharge.    The patient is discharged in stable condition.       Exam:   Vitals:  Vitals:    12/01/21 2024 12/02/21 0807 12/02/21 1446 12/02/21 1600   BP: (!) 148/92 (!) 152/79 (!) 174/82 (!) 153/70  Pulse: 79 58 63    Resp: 18 18     Temp: 97.5 F (36.4 C) 97.5 F (36.4 C) 97.7 F (36.5 C)    TempSrc: Oral Oral Oral    SpO2: 97% 100% 100%    Weight:       Height:         Weight: Weight - Scale: 266 lb 12.8 oz (121 kg)     General appearance: No apparent distress, well developed, appears stated age.  Eyes:  Pupils equal, round, and reactive to light. Conjunctivae/corneas clear.  HENT: Head normal in appearance. External nares normal.  Oral mucosa moist without lesions.  Hearing grossly intact.   Neck: Supple, with full range of motion. Trachea  midline.  No gross JVD appreciated.  Respiratory:  Normal respiratory effort. Clear to auscultation, bilaterally without rales or wheezes or rhonchi.  Cardiovascular: Normal rate, regular rhythm with normal S1/S2 without murmurs.  No lower extremity edema.   Abdomen: Soft, non-tender, non-distended with normal bowel sounds.  Musculoskeletal: There is no joint swelling or tenderness. Normal tone. No abnormal movements.   Skin: Warm and dry. No rashes or lesions.  Neurologic:  No focal sensory/motor deficits in the upper and lower extremities. Cranial nerves:  grossly non-focal 2-12.     Psychiatric: Alert and oriented, normal insight and thought content.   Capillary Refill: Brisk,< 3 seconds.  Peripheral Pulses: +2 palpable, equal bilaterally.       Labs: For convenience the most recent labs are provided:  CBC:    Lab Results   Component Value Date/Time    WBC 12.2 12/02/2021 06:02 AM    HGB 14.4 12/02/2021 12:42 PM    HCT 43.7 12/02/2021 12:42 PM    PLT 331 12/02/2021 06:02 AM     Renal:    Lab Results   Component Value Date/Time    NA 136 12/02/2021 12:42 PM    K 4.7 12/02/2021 12:42 PM    K 4.5 08/23/2021 11:45 AM    CL 103 12/02/2021 12:42 PM    CO2 17 12/02/2021 12:42 PM    BUN 39 12/02/2021 12:42 PM    CREATININE 1.4 12/02/2021 12:42 PM    CALCIUM 9.8 12/02/2021 12:42 PM     Liver:   Lab Results   Component Value Date/Time    AST 15 07/24/2021 04:12 AM    ALT 16 07/24/2021 04:12 AM         Significant Diagnostic Studies    Radiology:   MRI LUMBAR SPINE W WO CONTRAST   Final Result       1. There is a small sclerotic focus in the L2 vertebral body unchanged since previous CT scan of the abdomen dated 26 September 2021 and MRI scan of the lumbar spine dated 07/24/2021.   2. There is some ventral and left-sided disc protrusion at L5-S1 resulting in mild canal, moderate left and mild-to-moderate right-sided foraminal stenosis.   3. There is mild canal and mild-to-moderate bilateral foraminal stenosis at L4-5.   4. There  is degenerative change in the sacroiliac joints bilaterally.               **This report has been created using voice recognition software. It may contain minor errors which are inherent in voice recognition technology.**      Final report electronically signed by DR Drue Flirt on 11/30/2021 1:05 PM             Consults:   IP CONSULT TO NEUROSURGERY  PALLIATIVE CARE EVAL    Disposition: Home  Condition at Discharge: Stable    Code Status:  Limited     Patient Instructions:    Discharge lab work: BMP  Activity: activity as tolerated  Diet: ADULT DIET; Regular; 3 carb choices (45 gm/meal); Low Potassium (Less than 3000 mg/day)      Follow-up visits:   [name], MD  [address]  [place] [address]  [phone]    Follow up in 1 week(s)  Hospital follow-up, AKI follow-up on BMP.    Cherrie Gauze, MD  [place] [address]  [phone]    Follow up           Discharge Medications:      Medication List        START taking these medications      amLODIPine 5 MG tablet  Commonly known as: NORVASC  Take 1 tablet by mouth daily            CONTINUE taking these medications      glipiZIDE 10 MG extended release tablet  Commonly known as: GLUCOTROL XL     levETIRAcetam 500 MG tablet  Commonly known as: KEPPRA  Take 1 tablet by mouth 2 times daily     metFORMIN 500 MG tablet  Commonly known as: GLUCOPHAGE     pravastatin 80 MG tablet  Commonly known as: PRAVACHOL     Trulicity 1.5 HQ/4.6NG SC injection  Generic drug: dulaglutide            STOP taking these medications      lisinopril 40 MG tablet  Commonly known as: PRINIVIL;ZESTRIL     naproxen sodium 220 MG tablet  Commonly known as: ANAPROX               Where to Get Your Medications        These medications [id number] [place] [phone] [address] [id number]     Phone: [phone]  amLODIPine 5 MG tablet               Time Spent on discharge is 30 minutes in the examination, evaluation, counseling and review of medications and discharge plan.    Thank you MLucy Chris MD for the opportunity to be involved in this patient's care.      Signed:    Electronically signed by SBrock Bad DO PGY1 on 12/02/21 at 5:52 PM EDT     Case was discussed with Attending, Dr. KDaralene Milch

## 2021-12-04 LAB — EKG 12-LEAD
Atrial Rate: 63 {beats}/min
P Axis: 24 degrees
P-R Interval: 186 ms
Q-T Interval: 422 ms
QRS Duration: 94 ms
QTc Calculation (Bazett): 431 ms
R Axis: 16 degrees
T Axis: 56 degrees
Ventricular Rate: 63 {beats}/min

## 2021-12-07 ENCOUNTER — Inpatient Hospital Stay: Admit: 2021-12-07 | Payer: MEDICAID | Primary: Family Medicine

## 2021-12-07 ENCOUNTER — Ambulatory Visit: Admit: 2021-12-07 | Discharge: 2021-12-07 | Payer: MEDICAID | Attending: Medical Oncology | Primary: Family Medicine

## 2021-12-07 DIAGNOSIS — C642 Malignant neoplasm of left kidney, except renal pelvis: Secondary | ICD-10-CM

## 2021-12-07 MED ORDER — LENVATINIB (20 MG DAILY DOSE) 2 X 10 MG PO CPPK
210 x 10 MG | Freq: Every day | ORAL | 2 refills | Status: AC
Start: 2021-12-07 — End: ?
  Filled 2022-01-01: qty 60, 30d supply, fill #0

## 2021-12-07 NOTE — Patient Instructions (Addendum)
F/U 2 - 3 weeks  CBC/CMP next visit  CT Chest restaging  Started levnvatinib  Chemo teaching for lenvatinib

## 2021-12-07 NOTE — Progress Notes (Signed)
See ov note
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
## 2021-12-07 NOTE — Progress Notes (Signed)
Name: Marcus Burnett  DOB: 1961/02/06  MRN: [id number]    Oncology Navigation Follow-Up Note    Contact Type:  Medical Oncology    Subjective: appt with ONC     Objective:     ONC POC:  -ONC discussion with pt re: tx- options repeated.  Pt has declined XRT, but has agreed to Immunotherapy.  -Discussion re:  Lenvatinib- pt receptive; RX sent.  Nav explained Specialty Pharmacy processing of the RX & the coordinated delivery.  -CT Chest  -ONC discussed Genomic sequencing results & the variant that implies good response to Immunotherapy.  -ONC discussed Germline testing- pt declines at this time.  -Pt verbalized concern about [employer] Medicaid- & coverage at [facility] ending 6/30.  [place]helsea, Engineer, drilling spoke with pt & provided written information advising pt to call his insurance to "request pre-approval " to continue his care under Dr Alroy Dust for "continuity of care " for his cancer treatment.   Pt voiced understanding.  Ofce manager also shared the the "No Surprise Act" ensures the pt will not be billed for additional costs, if the contract negotiation is not achieved.    -Return in 2-3 weeks after CT scan is completed, & oral agent has been received & chemo teaching has been completed.  Anticipate tx start at that time.     Assistance Needed: denies    Receptive to Advanced Care Planning / Palliative Care:  deferred    Referrals: N/A    Education: POC reiterated    Notes: Nav following to assist & support as needed.    Electronically signed by Neysa Bonito, RN on 12/07/2021 at 11:00 AM

## 2021-12-11 ENCOUNTER — Encounter

## 2021-12-11 ENCOUNTER — Ambulatory Visit: Payer: MEDICAID | Primary: Family Medicine

## 2021-12-11 DIAGNOSIS — C7931 Secondary malignant neoplasm of brain: Secondary | ICD-10-CM

## 2021-12-11 NOTE — Progress Notes (Signed)
Oncology Social Work    Date: 12/11/2021  Time: 4:02 PM  Name: Marcus Burnett  MRN: [id number]     Contact Type: Follow-up    Note:   Situation: This staff called Marcus Burnett via phone support to introduce myself as his Oncology Education officer, museum.     Background:  Marcus Burnett had completed a Distress Screening at his initial appointment at the [facility]. This staff was calling to review the results of his distress levels.    Assessment: Although there were no reflected elevated concerns indicated on the Distress Screening, Marcus Burnett deferred all questions or concerns to his wife Marcus Burnett) as I could hear her laugh in the background.   - Education regarding the services provided by the SW were relayed on the conversation. [name] expressed that he didn't feel there were any immediate needs at this time, but would make note of the services and support I can provide in his future - should anything arise.    Recommendation: Follow-up will be initiated by [name] or [facility] based on need. Social Worker provided them with my contact information and will remain available for support.        [name], MSW, LSW, ACHP-SW  Oncology Social Worker      Electronically signed by [name], MSW, LSW, ACHP-SW on 12/11/2021 at 4:02 PM
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
## 2021-12-11 NOTE — Telephone Encounter (Signed)
Nav received call from pt  questioning the RX for Lenvatinib bc he stated he "didn't want to do anything until the CT Chest is completed & he knows those results."    Nav explained the oral agent is a TK Inhibitor which is aimed at slowing the cancer growth, & is used with Immunotherapy as standards therapy for his cancer dx.  Nav reminded pt that the Specialty Pharmacy will phone him after the RX is processed for coordinated delivery.  Nav also reminded pt to call when he received the medication so teaching can be coordinated.  Nav informed pt to NOT start taking the medication until he is directed to do so.       Pt voiced understanding of the above.

## 2021-12-12 ENCOUNTER — Inpatient Hospital Stay: Discharge: 2021-12-31 | Payer: MEDICAID | Attending: Radiation Oncology | Primary: Family Medicine

## 2021-12-24 ENCOUNTER — Inpatient Hospital Stay: Admit: 2021-12-24 | Payer: MEDICAID | Attending: Medical Oncology | Primary: Family Medicine

## 2021-12-24 DIAGNOSIS — C642 Malignant neoplasm of left kidney, except renal pelvis: Secondary | ICD-10-CM

## 2021-12-24 MED ORDER — IOPAMIDOL 76 % IV SOLN
76 % | Freq: Once | INTRAVENOUS | Status: AC | PRN
Start: 2021-12-24 — End: 2021-12-24
  Administered 2021-12-24: 21:00:00 80 mL via INTRAVENOUS

## 2021-12-25 ENCOUNTER — Observation Stay: Admit: 2021-12-25 | Payer: MEDICAID | Primary: Family Medicine

## 2021-12-25 ENCOUNTER — Inpatient Hospital Stay
Admission: EM | Admit: 2021-12-25 | Discharge: 2021-12-28 | Disposition: A | Discharge status: Home / Self Care | Payer: MEDICAID | Admitting: Internal Medicine

## 2021-12-25 DIAGNOSIS — I2692 Saddle embolus of pulmonary artery without acute cor pulmonale: Secondary | ICD-10-CM

## 2021-12-25 LAB — TROPONIN
Troponin, High Sensitivity: 21.5 ng/ml — ABNORMAL HIGH (ref 0.0–12.0)
Troponin, High Sensitivity: 18.6 ng/ml — ABNORMAL HIGH (ref 0.0–12.0)

## 2021-12-25 LAB — CBC WITH AUTO DIFFERENTIAL
Basophils Absolute: 0.1 10*3/uL (ref 0.0–0.1)
Basophils: 1.3 %
Eosinophils Absolute: 0.4 10*3/uL (ref 0.0–0.4)
Eosinophils: 5.8 %
Hematocrit: 42.5 % (ref 42.0–52.0)
Hemoglobin: 13.8 gm/dl — ABNORMAL LOW (ref 14.0–18.0)
Immature Grans (Abs): 0.02 10*3/uL (ref 0.00–0.07)
Immature Granulocytes: 0.3 %
Lymphocytes Absolute: 2.1 10*3/uL (ref 1.0–4.8)
Lymphocytes: 29.6 %
MCH: 28.1 pg (ref 26.0–33.0)
MCHC: 32.5 gm/dl (ref 32.2–35.5)
MCV: 86.6 fL (ref 80.0–94.0)
MPV: 9.6 fL (ref 9.4–12.4)
Monocytes Absolute: 0.7 10*3/uL (ref 0.4–1.3)
Monocytes: 9.3 %
Platelets: 348 10*3/uL (ref 130–400)
RBC: 4.91 10*6/uL (ref 4.70–6.10)
RDW-CV: 14.2 % (ref 11.5–14.5)
RDW-SD: 44.4 fL (ref 35.0–45.0)
Seg Neutrophils: 53.7 %
Segs Absolute: 3.8 10*3/uL (ref 1.8–7.7)
WBC: 7.1 10*3/uL (ref 4.8–10.8)
nRBC: 0 /100 wbc

## 2021-12-25 LAB — BASIC METABOLIC PANEL
BUN: 15 mg/dL (ref 7–22)
CO2: 23 meq/L (ref 23–33)
Calcium: 9.5 mg/dL (ref 8.5–10.5)
Chloride: 101 meq/L (ref 98–111)
Creatinine: 1.3 mg/dL — ABNORMAL HIGH (ref 0.4–1.2)
Glucose: 138 mg/dL — ABNORMAL HIGH (ref 70–108)
Potassium: 4.8 meq/L (ref 3.5–5.2)
Sodium: 137 meq/L (ref 135–145)

## 2021-12-25 LAB — ANION GAP: Anion Gap: 13 meq/L (ref 8.0–16.0)

## 2021-12-25 LAB — ANTI-XA, UNFRACTIONATED HEPARIN: Heparin Unfractionated: <0.04 U/ml — ABNORMAL LOW (ref 0.30–0.70)

## 2021-12-25 LAB — BRAIN NATRIURETIC PEPTIDE: Pro-BNP: 86.7 pg/mL (ref 0.0–124.0)

## 2021-12-25 LAB — PROTIME-INR: INR: 0.86 (ref 0.85–1.13)

## 2021-12-25 LAB — GLOMERULAR FILTRATION RATE, ESTIMATED: Est, Glom Filt Rate: >60 mL/min/{1.73_m2} (ref >60)

## 2021-12-25 LAB — APTT: aPTT: 31.7 seconds (ref 22.0–38.0)

## 2021-12-25 LAB — OSMOLALITY: Osmolality Calc: 276.8 mOsmol/kg (ref 275.0–300.0)

## 2021-12-25 MED ORDER — HEPARIN SODIUM (PORCINE) 1000 UNIT/ML IJ SOLN
1000 UNIT/ML | Freq: Once | INTRAMUSCULAR | Status: AC
Start: 2021-12-25 — End: 2021-12-25
  Administered 2021-12-25: 17:00:00 9620 [IU]/kg via INTRAVENOUS

## 2021-12-25 MED ORDER — POLYETHYLENE GLYCOL 3350 17 G PO PACK
17 g | Freq: Every day | ORAL | Status: AC | PRN
Start: 2021-12-25 — End: 2021-12-28
  Administered 2021-12-28: 01:00:00 17 g via ORAL

## 2021-12-25 MED ORDER — DEXTROSE 10 % IV SOLN
10 % | INTRAVENOUS | Status: AC | PRN
Start: 2021-12-25 — End: 2021-12-28

## 2021-12-25 MED ORDER — ONDANSETRON 4 MG PO TBDP
4 MG | Freq: Three times a day (TID) | ORAL | Status: AC | PRN
Start: 2021-12-25 — End: 2021-12-28

## 2021-12-25 MED ORDER — NORMAL SALINE FLUSH 0.9 % IV SOLN
0.9 % | INTRAVENOUS | Status: AC | PRN
Start: 2021-12-25 — End: 2021-12-28
  Administered 2021-12-28: 07:00:00 10 mL via INTRAVENOUS

## 2021-12-25 MED ORDER — DEXTROSE 10 % IV BOLUS
INTRAVENOUS | Status: AC | PRN
Start: 2021-12-25 — End: 2021-12-28

## 2021-12-25 MED ORDER — GLUCOSE 4 G PO CHEW
4 g | ORAL | Status: AC | PRN
Start: 2021-12-25 — End: 2021-12-28

## 2021-12-25 MED ORDER — AMLODIPINE BESYLATE 5 MG PO TABS
5 MG | Freq: Every day | ORAL | Status: AC
Start: 2021-12-25 — End: 2021-12-28
  Administered 2021-12-26 – 2021-12-28 (×3): 5 mg via ORAL

## 2021-12-25 MED ORDER — HEPARIN SOD (PORCINE) IN D5W 100 UNIT/ML IV SOLN
100 UNIT/ML | INTRAVENOUS | Status: AC
Start: 2021-12-25 — End: 2021-12-27
  Administered 2021-12-25: 17:00:00 17 [IU]/kg/h via INTRAVENOUS
  Administered 2021-12-26: 10:00:00 19 [IU]/kg/h via INTRAVENOUS
  Administered 2021-12-27: 02:00:00 13 [IU]/kg/h via INTRAVENOUS

## 2021-12-25 MED ORDER — GADOTERIDOL 279.3 MG/ML IV SOLN
279.3 MG/ML | Freq: Once | INTRAVENOUS | Status: AC | PRN
Start: 2021-12-25 — End: 2021-12-25
  Administered 2021-12-25: 19:00:00 20 mL via INTRAVENOUS

## 2021-12-25 MED ORDER — INSULIN LISPRO 100 UNIT/ML IJ SOLN
100 UNIT/ML | Freq: Every evening | INTRAMUSCULAR | Status: AC
Start: 2021-12-25 — End: 2021-12-28
  Administered 2021-12-27 – 2021-12-28 (×2): 4 [IU] via SUBCUTANEOUS

## 2021-12-25 MED ORDER — HEPARIN SODIUM (PORCINE) 1000 UNIT/ML IJ SOLN
1000 UNIT/ML | INTRAMUSCULAR | Status: DC | PRN
Start: 2021-12-25 — End: 2021-12-25

## 2021-12-25 MED ORDER — NORMAL SALINE FLUSH 0.9 % IV SOLN
0.9 % | Freq: Two times a day (BID) | INTRAVENOUS | Status: AC
Start: 2021-12-25 — End: 2021-12-28
  Administered 2021-12-26 – 2021-12-28 (×5): 10 mL via INTRAVENOUS

## 2021-12-25 MED ORDER — ACETAMINOPHEN 650 MG RE SUPP
650 | Freq: Four times a day (QID) | RECTAL | Status: DC | PRN
Start: 2021-12-25 — End: 2021-12-28

## 2021-12-25 MED ORDER — SODIUM CHLORIDE 0.9 % IV SOLN
0.9 % | INTRAVENOUS | Status: AC | PRN
Start: 2021-12-25 — End: 2021-12-28

## 2021-12-25 MED ORDER — ONDANSETRON HCL 4 MG/2ML IJ SOLN
4 MG/2ML | Freq: Four times a day (QID) | INTRAMUSCULAR | Status: AC | PRN
Start: 2021-12-25 — End: 2021-12-28

## 2021-12-25 MED ORDER — PRAVASTATIN SODIUM 80 MG PO TABS
80 MG | Freq: Every day | ORAL | Status: AC
Start: 2021-12-25 — End: 2021-12-28
  Administered 2021-12-26 – 2021-12-28 (×3): 80 mg via ORAL

## 2021-12-25 MED ORDER — INSULIN LISPRO 100 UNIT/ML IJ SOLN
100 UNIT/ML | Freq: Three times a day (TID) | INTRAMUSCULAR | Status: AC
Start: 2021-12-25 — End: 2021-12-28
  Administered 2021-12-26 (×2): 6 [IU] via SUBCUTANEOUS
  Administered 2021-12-26: 13:00:00 2 [IU] via SUBCUTANEOUS
  Administered 2021-12-27: 23:00:00 6 [IU] via SUBCUTANEOUS
  Administered 2021-12-27: 16:00:00 8 [IU] via SUBCUTANEOUS
  Administered 2021-12-27: 12:00:00 6 [IU] via SUBCUTANEOUS
  Administered 2021-12-28: 13:00:00 2 [IU] via SUBCUTANEOUS

## 2021-12-25 MED ORDER — LEVETIRACETAM 500 MG/5ML IV SOLN
500 | Freq: Once | INTRAVENOUS | Status: AC
Start: 2021-12-25 — End: 2021-12-25
  Administered 2021-12-25: 20:00:00 2000 mg via INTRAVENOUS

## 2021-12-25 MED ORDER — ACETAMINOPHEN 325 MG PO TABS
325 | Freq: Four times a day (QID) | ORAL | Status: DC | PRN
Start: 2021-12-25 — End: 2021-12-28
  Administered 2021-12-28: 13:00:00 650 mg via ORAL

## 2021-12-25 MED ORDER — GLUCAGON EMERGENCY 1 MG IJ KIT
1 MG | INTRAMUSCULAR | Status: AC | PRN
Start: 2021-12-25 — End: 2021-12-28

## 2021-12-25 MED FILL — LEVETIRACETAM 500 MG/5ML IV SOLN: 500 MG/5ML | INTRAVENOUS | Qty: 20

## 2021-12-25 MED FILL — HEPARIN SODIUM (PORCINE) 1000 UNIT/ML IJ SOLN: 1000 UNIT/ML | INTRAMUSCULAR | Qty: 10

## 2021-12-25 MED FILL — HEPARIN SOD (PORCINE) IN D5W 100 UNIT/ML IV SOLN: 100 UNIT/ML | INTRAVENOUS | Qty: 250

## 2021-12-25 NOTE — ED Notes (Signed)
Pt and vs reassessed. RR easy and unlabored. Pt resting in bed alert. Tim PA at bedside to assess pt. Pt stable at this time     Herbie Saxon, RN  12/25/21 1420

## 2021-12-25 NOTE — ED Notes (Signed)
Assumed care at this time. Received report from [facility]. Pt resting in bed alert and medicated per MAR. Pt updated on admission and stable at this time     Herbie Saxon, RN  12/25/21 1331

## 2021-12-25 NOTE — ED Provider Notes (Signed)
[facility] EMERGENCY DEPT    Pt Name: Marcus Burnett  MRN: [id number]  [place] 31-Dec-1960  Date of evaluation: 12/25/2021  Resident Physician: Raelene Bott, MD  Attending Physician: Dan Maker, MD      [place]       Chief Complaint   Patient presents with    Abnormal CT     HISTORY OF PRESENT ILLNESS   Marcus Burnett is a 61 y.o. 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  Patient had a CT performed yesterday to monitor the progression of his known cancer.  Was found to have multiple metastatic pulmonary nodules as well as liver lesions.  Incidentally the patient also had a saddle pulmonary embolism.  Patient denies any chest pain, shortness of breath, hemoptysis, syncope or leg swelling.  Patient was sent to the ED by his oncologist for further evaluation.    The patient has no other acute complaints at this time.  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    Diabetes mellitus ([place])     Hypertension        Patient Active Problem List   Diagnosis Code    Neoplasm of brain causing mass effect on adjacent structures ([place]) D49.6    Altered mental status R41.82    History of cancer metastatic to brain Z85.89    Renal cell carcinoma (H[facility]) C64.9    Goals of care, counseling/discussion Z71.89    Wound dehiscence T81.30XA    Type 2 diabetes mellitus without complication, without long-term current use of insulin ([place]) E11.9    Primary hypertension I10    Hyperlipidemia E78.5    Metastasis to brain ([place]) C79.31     SURGICAL HISTORY       Past Surgical History:   Procedure Laterality Date    CRANIOTOMY Right 07/26/2021    RIGHT SIDED CRANIOTOMY FOR RESECTION OF OCCIPITAL TUMOR AND OCCIPITAL TEMPORAL  TUMOR. performed by Illene Labrador, MD at [facility] Left 07/31/2021    Left Craniotomy Resection of Tumor performed by Illene Labrador, MD at [facility] N/A 08/22/2021    I & D AND CLOSURE FOR LEFT CRANIAL WOUND DEHISENCE performed by Darrol Angel, MD at [facility] Left 10/2020       CURRENT MEDICATIONS       Previous Medications    AMLODIPINE (NORVASC) 5 MG TABLET    Take 1 tablet by mouth daily    GLIPIZIDE (GLUCOTROL XL) 10 MG EXTENDED RELEASE TABLET    TAKE 1 TABLET BY MOUTH EVERY DAY    LENVATINIB, 20 MG DAILY DOSE, 2 X 10 MG CPPK    Take 20 mg by mouth daily    LEVETIRACETAM (KEPPRA) 500 MG TABLET    Take 1 tablet by mouth 2 times daily    METFORMIN (GLUCOPHAGE) 500 MG TABLET    2 tablets daily    PRAVASTATIN (PRAVACHOL) 80 MG TABLET    TAKE 1 TABLET BY MOUTH EVERY DAY    TRULICITY 1.5 AY/3.0ZS SOPN    INJECT 0.5 ML (1.'5MG'$  DOSE) INTO THE SKIN EVERY  7 DAYS       ALLERGIES     is allergic to dapagliflozin.    FAMILY HISTORY     He indicated that his mother is alive. He indicated that his father is deceased.       SOCIAL HISTORY       Social History     Tobacco Use    Smoking status: Never     Passive exposure: Never    Smokeless tobacco: Never   Vaping Use    Vaping Use: Never used   Substance Use Topics    Alcohol use: Never    Drug use: Never       PHYSICAL EXAM       ED Triage Vitals [12/25/21 1035]   BP Temp Temp src Pulse Respirations SpO2 Height Weight - Scale   (!) 153/92 97.9 F (36.6 C) -- 64 21 94 % '5\' 10"'$  (1.778 m) 265 lb (120.2 kg)       Physical Exam  Vitals and nursing note reviewed.   Constitutional:       General: He is not in acute distress.     Appearance: Normal appearance. He is normal weight. He is not toxic-appearing.   HENT:      Head: Normocephalic and atraumatic.      Right Ear: Tympanic membrane normal.      Left Ear: Tympanic membrane normal.      Nose: Nose normal.      Mouth/Throat:      Mouth: Mucous membranes are moist.      Pharynx:  Oropharynx is clear.   Eyes:      General: No scleral icterus.     Extraocular Movements: Extraocular movements intact.      Conjunctiva/sclera: Conjunctivae normal.      Pupils: Pupils are equal, round, and reactive to light.   Cardiovascular:      Rate and Rhythm: Normal rate and regular rhythm.      Pulses: Normal pulses.      Heart sounds: Normal heart sounds. No murmur heard.    No friction rub. No gallop.   Pulmonary:      Effort: Pulmonary effort is normal.      Breath sounds: Normal breath sounds. No wheezing or rales.   Abdominal:      Palpations: Abdomen is soft.      Tenderness: There is no abdominal tenderness. There is no guarding or rebound.   Musculoskeletal:         General: Normal range of motion.      Cervical back: Normal range of motion and neck supple.      Right lower leg: No edema.      Left lower leg: No edema.   Skin:     General: Skin is warm and dry.      Capillary Refill: Capillary refill takes less than 2 seconds.   Neurological:      General: No focal deficit present.      Mental Status: He is alert and oriented to person, place, and time.      Cranial Nerves: No cranial nerve deficit.      Sensory: No sensory deficit.      Motor: No weakness.      Coordination: Coordination normal.         FORMAL DIAGNOSTIC RESULTS     RADIOLOGY: Interpretation per the Radiologist below, if available at the time of this note (none if blank):    No orders to display  LABS: (none if blank)  Labs Reviewed   CBC WITH AUTO DIFFERENTIAL - Abnormal; Notable for the following components:       Result Value    Hemoglobin 13.8 (*)     All other components within normal limits   BASIC METABOLIC PANEL - Abnormal; Notable for the following components:    Glucose 138 (*)     Creatinine 1.3 (*)     All other components within normal limits   BRAIN NATRIURETIC PEPTIDE   PROTIME-INR   APTT   ANION GAP   OSMOLALITY   GLOMERULAR FILTRATION RATE, ESTIMATED   TROPONIN   CBC   ANTI-XA, UNFRACTIONATED HEPARIN   ANTI-XA,  UNFRACTIONATED HEPARIN       (Any cultures that may have been sent were not resulted at the time of this patient visit)    [place] / ED COURSE:     Number and Complexity of Problems            Problem List This Visit:         Chief Complaint   Patient presents with    Abnormal CT            Differential Diagnosis includes (but not limited to):  Pulmonary embolism    Although some of these diagnoses are unlikely, they were consider in my medical decision making.               Pertinent Comorbid Conditions:    Cancer    Data Reviewed (none if left blank)          My Independent interpretations:     EKG:      See Ed Course (if available)    Imaging: See Ed Course (if available)    Labs:      See Ed Course (if available)                 Decision Rules/Clinical Scores utilized:              External Documentation Reviewed:         Previous patient encounter documents & history available on EMR was reviewed              See Formal Diagnostic Results above for the lab and radiology tests and orders.    Please see ED course for further reassessments, treatments, MDM, and final disposition.            Case discussed with specialties other than Emergency Medicine: Hospitalist and IR         Shared Decision-Making was performed, disposition discussed with the patient/family and questions answered.         Social determinants of health impacting treatment or disposition:           Code Status:  Not addressed during this ED visit      Summary of Patient Presentation:      The patient presents with incidental saddle pulmonary embolism.  No evidence of right heart strain.  Patient essentially asymptomatic however large clot burden on CT.  Started the patient on a heparin drip.  Spoke with IR, does not recommend thrombectomy or catheter directed lysis at this time.    ED Course as of 12/25/21 1250   Tue Dec 25, 2021   1033 CT CHEST W CONTRAST (12/25/21 5284)  1. Although not tailored for the detection of pulmonary  emboli, a moderate saddle pulmonary embolus extending into the right and left main pulmonary arterial  branches as well as the upper and lower lobar branches is observed. No right heart strain is   visualized.     2. Multifocal metastatic pulmonary nodules measuring 2 to 7 mm appear larger in size and more numerous with measurements provided in the discussion.     3. Multifocal hyperattenuating liver lesions measuring up to 25 mm are noted in the limited images through the upper abdomen suggestive of hepatic metastasis. [TM]   1050 EKG 12 Lead  No strain pattern [TM]   1134 WBC: 7.1 [TM]   1208 Interventional radiology is in the procedure.  Spoke with nurse.  She will pass these onto the interventional list. [TM]   1228 Spoke with interventional radiology.  Not recommending intervention at this time. [TM]   1607 Patient ambulating without difficulty.  Vitals within normal limits. [TM]      ED Course User Index  [TM] Wilnette Kales, MD     ED Medications administered this visit:  (None if blank)  Medications   heparin (porcine) injection 9,620 Units (has no administration in time range)   heparin (porcine) injection 9,620 Units (has no administration in time range)   heparin (porcine) injection 4,810 Units (has no administration in time range)   heparin 25,000 units in dextrose 5% 250 mL (premix) infusion (has no administration in time range)     Vitals Reviewed:    Vitals:    12/25/21 1035 12/25/21 1135 12/25/21 1150   BP: (!) 153/92 137/74 137/74   Pulse: 64 63 64   Resp: '21 19 21   '$ Temp: 97.9 F (36.6 C)     SpO2: 94% 93% 94%   Weight: 265 lb (120.2 kg)     Height: '5\' 10"'$  (1.778 m)         PROCEDURES: (None if blank)  Procedures:     CRITICAL CARE: (Please see Attending note / Attestation regarding Critical Care Time. )    Medical Decision Making  Problems Addressed:  Acute saddle pulmonary embolism without acute cor pulmonale ([place]): acute illness or injury    Amount and/or Complexity of Data Reviewed  Labs:  ordered.  Radiology: ordered.  ECG/medicine tests: ordered.    Risk  Decision regarding hospitalization.         FINAL IMPRESSION      1. Acute saddle pulmonary embolism without acute cor pulmonale (HCC)          DISPOSITION/PLAN       Condition: condition: stable  Dispo: Admit to telemetry  DISPOSITION Decision To [place] 12/25/2021 12:44:50 PM          Outpatient follow up (If applicable):  No follow-up provider specified.  Not applicable          DISCHARGE PRESCRIPTIONS: (None if blank)  New Prescriptions    No medications on file         This transcription was electronically signed. Parts of this transcriptions may have been dictated by use of voice recognition software and electronically transcribed, and parts may have been transcribed with the assistance of an ED scribe. The transcription may contain errors not detected in proofreading.  Please refer to my supervising physician's documentation if my documentation differs.    Electronically Signed: Raelene Bott, MD, 12/25/21, 12:50 PM       Wilnette Kales, MD  Resident  12/25/21 1250

## 2021-12-25 NOTE — ED Notes (Signed)
Presents to ED with wife from home with complaints of an abnormal CT scan. PT states he had an outpatient CT scan and was called today to come to ER immediately. States it resulted with a large blood clot. PT denies shortness of breath and denies chest pain. EKG completed. Dr. Elta Guadeloupe at bedside.     Jaidence Geisler Juanetta Snow) M Reatha Sur, RN  12/25/21 1050

## 2021-12-25 NOTE — ED Notes (Signed)
Pt and vs reassessed. RR easy and unlabored. Pt resting in bed alert and denies any needs     Herbie Saxon, RN  12/25/21 1909

## 2021-12-25 NOTE — H&P (Signed)
Hospitalist History & Physical    Patient:  Marcus Burnett    Unit/Bed:20/020A  Date of Birth: 1961/05/04  MRN: [id number]   Acct: [id number]   PCP: [name], MD  Code Status: Full Code    Date of Service: Pt seen/examined on 12/25/21 and admitted to Inpatient with expected LOS greater than two midnights due to medical therapy.     Chief Complaint: abnormal imaging    Assessment/Plan:    Saddle pulmonary embolism  Likely secondary to hypercoagulable state due to malignancy  No evidence of right heart strain on CTA of chest. Hemodynamically stable.  Check echocardiogram  Discussed with hematology oncology.  Will initiate heparin, but given extent of intracranial metastases, will defer bolusing.  CT head negative for bleeding per radiology.  Metastatic renal cell carcinoma  Follows with oncology on outpatient basis.  Not currently on therapy.  It appears Lenvatinib was recommended to be initiated, but the patient states he does not know what this medication is.  Consult placed to hematology oncology for further recommendations regarding plan of care  MRI of brain with and without contrast ordered for potential initiation of radiation therapy  Focal seizure with retained awareness  Likely secondary to significant intracranial metastases coupled with medication noncompliance.  Patient states he was taking his Keppra '500mg'$  once daily.  2 g Keppra ordered in the emergency department.  Escalate Keppra to 1 g IV twice daily.  Seizure precautions.  MRI of brain with and without contrast pending.  Neurology consult placed  CT head formal read pending.  Preliminary discussion with radiologist, no acute hemorrhage.  Hypertension  On Norvasc  Hyperlipidemia  On pravastatin  Non-insulin-dependent type 2 diabetes mellitus  Hold home meds.  Sliding scale.  Accu-Cheks.    History of Present Illness:  Marcus Burnett is a 61 y.o. male with a history of metastatic renal cell carcinoma, seizure disorder, hypertension,  hyperlipidemia, and non-insulin-dependent type 2 diabetes mellitus who presented to [facility] with chief complaint of abnormal imaging.  The patient recently underwent a CT of the chest with contrast for further evaluation regarding his metastatic renal cancer.  An incidental finding of a saddle PE was noted and the patient was instructed to come to the emergency department.  At this time, he is completely asymptomatic.  He has no shortness of breath, palpitations, fatigue, weakness, or chest pain.  The patient has a history of metastatic renal cell carcinoma.  He is not currently on treatment and has not undergone radiation treatment.  He has undergone multiple brain surgeries for this and has multiple known intracranial mets.  During his most recent admission, there was 2 episodes of seizure activity noted on the right side of the patient's body.  While in the emergency department today, the patient and the family note that he had approximately 1 to 2 minutes of involuntary movements impacting the left upper extremity and possibly the right upper extremity.  The patient states he was unable to suppress these.  He does report that he has only been taking his Keppra once daily.  Upon reviewing the patient's last oncology note, it was recommended that he started a new medication, but he is not currently taking it.  He denies any headaches, extremity weakness, numbness, or gait disturbance.      Review of Systems: Pertinent positives as noted in the HPI. All other systems reviewed and negative.    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 performed by Illene Labrador, MD at [facility] Left 07/31/2021    Left Craniotomy  Resection of Tumor performed by Illene Labrador, MD at [facility] N/A 08/22/2021    I & D AND CLOSURE FOR LEFT CRANIAL WOUND DEHISENCE performed by Darrol Angel, MD at [facility] Left 10/2020       Home Medications:   No current facility-administered medications on file prior to encounter.     Current Outpatient Medications on File Prior to Encounter   Medication Sig Dispense Refill    Lenvatinib, 20 MG Daily Dose, 2 x 10 MG CPPK Take 20 mg by mouth daily 28 each 2    amLODIPine (NORVASC) 5 MG tablet Take 1 tablet by mouth daily 30 tablet 0    levETIRAcetam (KEPPRA) 500 MG tablet Take 1 tablet by mouth 2 times daily 60 tablet 0    TRULICITY 1.5 WR/6.0AV SOPN INJECT 0.5 ML (1.'5MG'$  DOSE) INTO THE SKIN EVERY 7 DAYS      metFORMIN (GLUCOPHAGE) 500 MG tablet 2 tablets daily      glipiZIDE (GLUCOTROL XL) 10 MG extended release tablet TAKE 1 TABLET BY MOUTH EVERY DAY      pravastatin (PRAVACHOL) 80 MG tablet TAKE 1 TABLET BY MOUTH EVERY DAY         Allergies:    Dapagliflozin    Social History:    reports that he has never smoked. He has never been exposed to tobacco smoke. He has never used smokeless tobacco. He reports that he does not drink alcohol and does not use drugs.    Family History:       Problem Relation Age of Onset    Kidney Disease Father     Heart Disease Father     Diabetes Father        Diet:  Diet NPO      Physical Exam:  BP (!) 153/90   Pulse 61   Temp 97.9 F (36.6 C)   Resp 16   Ht '5\' 10"'$  (1.778 m)   Wt 265 lb (120.2 kg)   SpO2 96%   BMI 38.02 kg/m   General: Alert, in no acute distress, cooperative  HEENT:  Normocephalic and atraumatic.  No scleral icterus.  Pupils equal, round, and reactive to light.  External nares without deformity.  External ears normal.  Neck: Supple.  No JVD. No thyromegaly.  No cervical lymphadenopathy.  Lungs: On room air. Respiratory rate and effort normal.  Lungs clear to auscultation bilaterally.  Cardiac: Regular rate and rhythm.  No  murmur, rubs, or gallops.  Abdomen: Nondistended, soft, no tenderness to palpation, guarding, rigidity.  Bowel sounds normoactive.  Extremities:  No clubbing, cyanosis, or lower extremity edema.  Vasculature: capillary refill < 3 seconds.  Lower extremity pulses 2+ bilaterally  Skin:  Warm and dry. No rashes or lesions.   Psych: Mood normal.  Affect appropriate.  Neurologic: Alert and oriented x4.  No cranial nerve deficits.  Muscle strength 5/5 in right upper extremity, left upper extremity, and left lower  extremity.  Muscle strength 4 -/5 in right lower extremity.    Data: (All radiographs, tracings, PFTs, and imaging are personally viewed and interpreted unless otherwise noted)  Labs:   Recent Labs     12/25/21  1050   WBC 7.1   HGB 13.8*   HCT 42.5   PLT 348     Recent Labs     12/25/21  1050   NA 137   K 4.8   CL 101   CO2 23   BUN 15   CREATININE 1.3*   CALCIUM 9.5     No results for input(s): AST, ALT, BILIDIR, BILITOT, ALKPHOS in the last 72 hours.  Recent Labs     12/25/21  1050   INR 0.86     No results for input(s): CKTOTAL, TROPONINI in the last 72 hours.  Urinalysis:   Lab Results   Component Value Date/Time    NITRU NEGATIVE 12/01/2021 05:42 PM    WBCUA NONE SEEN 12/01/2021 05:42 PM    BACTERIA NONE SEEN 12/01/2021 05:42 PM    RBCUA NONE SEEN 12/01/2021 05:42 PM    BLOODU NEGATIVE 12/01/2021 05:42 PM    SPECGRAV 1.014 12/01/2021 05:42 PM    GLUCOSEU NEGATIVE 07/23/2021 06:00 PM       EKG: normal sinus rhythm, no blocks or conduction defects, no ischemic changes    Radiology:  No results found.    DVT prophylaxis: heparin    Diet: Diet NPO    Fluids: by mouth    Code Status: Full Code    Therapies: Not indicated at this time    Tele:   '[x]'$  yes             '[]'$  no      Thank you [name], MD for the opportunity to be involved in this patient's care.    Electronically signed by Otilio Jefferson, PA-C on 12/25/2021 at 2:57 PM

## 2021-12-25 NOTE — ED Notes (Signed)
Pt and vs reassessed. RR easy and unlabored. Pt resting in bed alert and medicated per MAR. Pt stable at this time     Herbie Saxon, [place]  12/25/21 1551

## 2021-12-25 NOTE — ED Notes (Signed)
Pt and vs reassessed. RR easy and unlabored. Pt resting in bed alert and updated on IP bed assignment. Pt stable at this time     Herbie Saxon, [place]  12/25/21 1744

## 2021-12-25 NOTE — ED Notes (Signed)
ED to inpatient nurses report    Chief Complaint   Patient presents with    Abnormal CT      Present to ED from home  LOC: alert and orientated to name, place, date  Vital signs   Vitals:    12/25/21 1035 12/25/21 1135 12/25/21 1150 12/25/21 1255   BP: (!) 153/92 137/74 137/74    Pulse: 64 63 64 58   Resp: '21 19 21 17   '$ Temp: 97.9 F (36.6 C)      SpO2: 94% 93% 94% 98%   Weight: 265 lb (120.2 kg)      Height: '5\' 10"'$  (1.778 m)         Oxygen Baseline room air    Current needs required none Bipap/Cpap No  LDAs:   Peripheral IV 12/25/21 Left Antecubital (Active)   Site Assessment Clean, dry & intact 12/25/21 1044     Mobility: Independent  Pending ED orders: none  Present condition: stable, saddle PEs      C-SSRS Risk of Suicide: No Risk  Swallow Screening    Preferred Language: [place]     Electronically signed by Leatha Gilding) M Harvey Lingo, RN on 12/25/2021 at 12:56 PM      Detron Carras Juanetta Snow) M Roisin Mones, RN  12/25/21 1256

## 2021-12-25 NOTE — ED Notes (Signed)
Pt and vs reassessed. RR easy and unlabored. Pt resting in bed alert and stable at this time     Herbie Saxon, RN  12/25/21 1619

## 2021-12-25 NOTE — Telephone Encounter (Signed)
Received call on Dr. Virgilio Belling patient from Radiology, Dr. Vennie Homans. The patient had restaging CT of chest completed on 12/24/21. Dr. Vennie Homans called with below critical findings:     IMPRESSION:  1. Although not tailored for the detection of pulmonary emboli, a moderate saddle pulmonary embolus extending into the right and left main pulmonary arterial branches as well as the upper and lower lobar branches is observed. No right heart strain is   visualized.     2. Multifocal metastatic pulmonary nodules measuring 2 to 7 mm appear larger in size and more numerous with measurements provided in the discussion.     3. Multifocal hyperattenuating liver lesions measuring up to 25 mm are noted in the limited images through the upper abdomen suggestive of hepatic metastasis.     COMMUNICATION: The results of this examination were discussed with physician's assistant Dora Sims receiving messages on behalf of Dr. Salome Arnt at 9:18 AM on 12/25/2021.      Called patient and discussed results. He states he is at home and feels well. He denies lightheadedness, dizziness, chest pain, shortness of breath, DOE, near syncope, palpitations. Instructed patient to go to ED immediately. Patient and wife on line expressed understanding.     Called report to ED RN, Raquel Sarna.     Electronically signed by   Kathaleen Bury, PA-C on 12/25/2021 at 9:32 AM

## 2021-12-26 LAB — CBC WITH AUTO DIFFERENTIAL
Basophils Absolute: 0 10*3/uL (ref 0.0–0.1)
Basophils: 0.7 %
Eosinophils Absolute: 0 10*3/uL (ref 0.0–0.4)
Eosinophils: 0.5 %
Hematocrit: 41.1 % — ABNORMAL LOW (ref 42.0–52.0)
Hemoglobin: 13.3 gm/dl — ABNORMAL LOW (ref 14.0–18.0)
Immature Grans (Abs): 0.02 10*3/uL (ref 0.00–0.07)
Immature Granulocytes: 0.3 %
Lymphocytes Absolute: 0.9 10*3/uL — ABNORMAL LOW (ref 1.0–4.8)
Lymphocytes: 15.5 %
MCH: 27.8 pg (ref 26.0–33.0)
MCHC: 32.4 gm/dl (ref 32.2–35.5)
MCV: 85.8 fL (ref 80.0–94.0)
MPV: 10.1 fL (ref 9.4–12.4)
Monocytes Absolute: 0.1 10*3/uL — ABNORMAL LOW (ref 0.4–1.3)
Monocytes: 1.6 %
Platelets: 348 10*3/uL (ref 130–400)
RBC: 4.79 10*6/uL (ref 4.70–6.10)
RDW-CV: 14.1 % (ref 11.5–14.5)
RDW-SD: 43.5 fL (ref 35.0–45.0)
Seg Neutrophils: 81.4 %
Segs Absolute: 4.7 10*3/uL (ref 1.8–7.7)
WBC: 5.8 10*3/uL (ref 4.8–10.8)
nRBC: 0 /100 wbc

## 2021-12-26 LAB — OSMOLALITY: Osmolality Calc: 278.2 mOsmol/kg (ref 275.0–300.0)

## 2021-12-26 LAB — BASIC METABOLIC PANEL W/ REFLEX TO MG FOR LOW K
BUN: 15 mg/dL (ref 7–22)
CO2: 20 meq/L — ABNORMAL LOW (ref 23–33)
Calcium: 9.5 mg/dL (ref 8.5–10.5)
Chloride: 102 meq/L (ref 98–111)
Creatinine: 1.1 mg/dL (ref 0.4–1.2)
Glucose: 230 mg/dL — ABNORMAL HIGH (ref 70–108)
Potassium reflex Magnesium: 4.8 meq/L (ref 3.5–5.2)
Sodium: 135 meq/L (ref 135–145)

## 2021-12-26 LAB — ANTI-XA, UNFRACTIONATED HEPARIN
Heparin Unfractionated: <0.04 U/ml — ABNORMAL LOW (ref 0.30–0.70)
Heparin Unfractionated: 0.95 U/ml — ABNORMAL HIGH (ref 0.30–0.70)
Heparin Unfractionated: 1.35 U/ml (ref 0.30–0.70)
Heparin Unfractionated: 1.6 U/ml (ref 0.30–0.70)

## 2021-12-26 LAB — POCT GLUCOSE
POC Glucose: 135 mg/dl — ABNORMAL HIGH (ref 70–108)
POC Glucose: 225 mg/dl — ABNORMAL HIGH (ref 70–108)
POC Glucose: 335 mg/dl — ABNORMAL HIGH (ref 70–108)
POC Glucose: 325 mg/dl — ABNORMAL HIGH (ref 70–108)

## 2021-12-26 LAB — ECHOCARDIOGRAM COMPLETE 2D W DOPPLER W COLOR: Left Ventricular Ejection Fraction: 58

## 2021-12-26 LAB — ANION GAP: Anion Gap: 13 meq/L (ref 8.0–16.0)

## 2021-12-26 LAB — GLOMERULAR FILTRATION RATE, ESTIMATED: Est, Glom Filt Rate: >60 mL/min/{1.73_m2} (ref >60)

## 2021-12-26 MED ORDER — LEVETIRACETAM 500 MG PO TABS
500 MG | Freq: Two times a day (BID) | ORAL | Status: AC
Start: 2021-12-26 — End: 2021-12-28
  Administered 2021-12-27 – 2021-12-28 (×4): 1000 mg via ORAL

## 2021-12-26 MED ORDER — PANTOPRAZOLE SODIUM 40 MG PO TBEC
40 MG | Freq: Every day | ORAL | Status: DC
Start: 2021-12-26 — End: 2021-12-28
  Administered 2021-12-26 – 2021-12-28 (×4): 40 mg via ORAL

## 2021-12-26 MED ORDER — DEXAMETHASONE SODIUM PHOSPHATE 4 MG/ML IJ SOLN
4 MG/ML | Freq: Four times a day (QID) | INTRAMUSCULAR | Status: AC
Start: 2021-12-26 — End: 2021-12-28
  Administered 2021-12-26 – 2021-12-28 (×11): 4 mg via INTRAVENOUS

## 2021-12-26 MED FILL — PRAVASTATIN SODIUM 80 MG PO TABS: 80 MG | ORAL | Qty: 1

## 2021-12-26 MED FILL — LEVETIRACETAM 500 MG PO TABS: 500 MG | ORAL | Qty: 2

## 2021-12-26 MED FILL — PANTOPRAZOLE SODIUM 40 MG PO TBEC: 40 MG | ORAL | Qty: 1

## 2021-12-26 MED FILL — DEXAMETHASONE SODIUM PHOSPHATE 4 MG/ML IJ SOLN: 4 MG/ML | INTRAMUSCULAR | Qty: 1

## 2021-12-26 MED FILL — HUMALOG 100 UNIT/ML IJ SOLN: 100 UNIT/ML | INTRAMUSCULAR | Qty: 6

## 2021-12-26 MED FILL — HEPARIN SOD (PORCINE) IN D5W 100 UNIT/ML IV SOLN: 100 UNIT/ML | INTRAVENOUS | Qty: 250

## 2021-12-26 MED FILL — AMLODIPINE BESYLATE 5 MG PO TABS: 5 MG | ORAL | Qty: 1

## 2021-12-26 MED FILL — HUMALOG 100 UNIT/ML IJ SOLN: 100 UNIT/ML | INTRAMUSCULAR | Qty: 2
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
## 2021-12-26 NOTE — Consults (Cosign Needed Addendum)
Neurology Consult Note    Date:12/26/2021       Room:4K-07/007-A  Patient Name:Marcus Burnett     Date of Birth:1961-03-31     Age:61 y.o.    Requesting Physician: Ardelle Lesches, DO     Reason for Consult:  Evaluate for Seizure      Chief Complaint:   Chief Complaint   Patient presents with    Abnormal CT       Subjective     Marcus Burnett is a 61 y.o. male with a history of metastatic renal cell carcinoma, seizure disorder, hypertension, hyperlipidemia, and non-insulin-dependent type 2 diabetes mellitus who presented to [facility] with chief complaint of abnormal imaging.  The patient recently underwent a CT of the chest with contrast for further evaluation regarding his metastatic renal cancer.  An incidental finding of a saddle PE was noted and the patient was instructed to come to the emergency department.  At this time, he is completely asymptomatic.  He has no shortness of breath, palpitations, fatigue, weakness, or chest pain.  The patient has a history of metastatic renal cell carcinoma.  He is not currently on treatment and has not undergone radiation treatment.  He has undergone multiple brain surgeries for this and has multiple known intracranial mets.  During his most recent admission, there was 2 episodes of seizure activity noted on the right side of the patient's body.  While in the emergency department today, the patient and the family note that he had approximately 1 to 2 minutes of involuntary movements impacting the left upper extremity and possibly the right upper extremity.  The patient states he was unable to suppress these.  He does report that he has only been taking his Keppra once daily. He denied any loss of consciousness, bowel or bladder incontinence during these involuntary movements. He is completely aware when they occur. He denies any headaches, extremity weakness, numbness, or gait disturbance. Sitting up in the chair in no acute distress. He denied any complaints. No abnormal movements  since last night in the ER.         Review of Systems   Review of Systems   Neurological:  Positive for seizures and weakness.   Medications   Scheduled Meds:    sodium chloride flush  5-40 mL IntraVENous 2 times per day    amLODIPine  5 mg Oral Daily    pravastatin  80 mg Oral Daily    insulin lispro  0-8 Units SubCUTAneous TID WC    insulin lispro  0-4 Units SubCUTAneous Nightly    dexamethasone  4 mg IntraVENous Q6H    pantoprazole  40 mg Oral QAM AC     Continuous Infusions:    heparin (PORCINE) Infusion Stopped (12/26/21 0729)    sodium chloride      dextrose       PRN Meds: sodium chloride flush, sodium chloride, ondansetron **OR** ondansetron, polyethylene glycol, acetaminophen **OR** acetaminophen, glucose, dextrose bolus **OR** dextrose bolus, glucagon (rDNA), dextrose  Medications Prior to Admission:   No current facility-administered medications on file prior to encounter.     Current Outpatient Medications on File Prior to Encounter   Medication Sig Dispense Refill    Lenvatinib, 20 MG Daily Dose, 2 x 10 MG CPPK Take 20 mg by mouth daily (Patient not taking: Reported on 12/25/2021) 28 each 2    amLODIPine (NORVASC) 5 MG tablet Take 1 tablet by mouth daily 30 tablet 0    levETIRAcetam (KEPPRA) 500 MG tablet  Take 1 tablet by mouth 2 times daily 60 tablet 0    TRULICITY 1.5 MG/0.5ML SOPN INJECT 0.5 ML (1.5MG  DOSE) INTO THE SKIN EVERY 7 DAYS      metFORMIN (GLUCOPHAGE) 500 MG tablet 2 tablets daily      glipiZIDE (GLUCOTROL XL) 10 MG extended release tablet TAKE 1 TABLET BY MOUTH EVERY DAY      pravastatin (PRAVACHOL) 80 MG tablet TAKE 1 TABLET BY MOUTH EVERY DAY       Past History    Past Medical History:   has a past medical history of Cancer (HCC), Diabetes mellitus (HCC), and Hypertension.    Social History:   reports that he has never smoked. He has never been exposed to tobacco smoke. He has never used smokeless tobacco. He reports that he does not drink alcohol and does not use drugs.     Family  History:   Family History   Problem Relation Age of Onset    Kidney Disease Father     Heart Disease Father     Diabetes Father        Physical Examination      Vitals:  BP 133/73   Pulse 67   Temp 97.1 F (36.2 C) (Axillary)   Resp 18   Ht 5\' 10"  (1.778 m)   Wt 261 lb 7.5 oz (118.6 kg)   SpO2 93%   BMI 37.52 kg/m   Temp (24hrs), Avg:97.7 F (36.5 C), Min:97.1 F (36.2 C), Max:98.1 F (36.7 C)      I/O (24Hr):    Intake/Output Summary (Last 24 hours) at 12/26/2021 0738  Last data filed at 12/26/2021 0343  Gross per 24 hour   Intake 550 ml   Output 300 ml   Net 250 ml         Physical Exam  Constitutional:       Appearance: Normal appearance.   Eyes:      Extraocular Movements: Extraocular movements intact.      Pupils: Pupils are equal, round, and reactive to light.   Neurological:      General: No focal deficit present.      Mental Status: He is alert and oriented to person, place, and time.      Motor: Weakness present.      Coordination: Finger-Nose-Finger Test and Heel to [employer] normal.   Psychiatric:         Mood and Affect: Mood normal.         Speech: Speech normal.         Behavior: Behavior normal.     Neurologic Exam     Mental Status   Oriented to person, place, and time.   Attention: normal. Concentration: normal.   Speech: speech is normal   Level of consciousness: alert  Knowledge: good.     Cranial Nerves     CN II   Visual acuity: normal  Right visual field deficit: none  Left visual field deficit: none     CN III, IV, VI   Pupils are equal, round, and reactive to light.    CN V   Facial sensation intact.     CN VII   Facial expression full, symmetric.     Motor Exam   Muscle bulk: normal  Right arm pronator drift: absent  Left arm pronator drift: absent  Right leg tone: decreased    Sensory Exam   Light touch normal.     Gait, Coordination, and Reflexes  Coordination   Finger to nose coordination: normal  Heel to shin coordination: normal     Labs/Imaging/Diagnostics    Labs:  CBC:  Recent Labs     12/25/21  1050 12/26/21  0329   WBC 7.1 5.8   RBC 4.91 4.79   HGB 13.8* 13.3*   HCT 42.5 41.1*   MCV 86.6 85.8   PLT 348 348     CHEMISTRIES:  Recent Labs     12/25/21  1050 12/26/21  0329   NA 137 135   K 4.8 4.8   CL 101 102   CO2 23 20*   BUN 15 15   CREATININE 1.3* 1.1   GLUCOSE 138* 230*     COAGULATION STUDIES:  Recent Labs     12/25/21  1050   INR 0.86   APTT 31.7     LIVER PROFILE:No results for input(s): AST, ALT, BILIDIR, BILITOT, ALKPHOS in the last 72 hours.  CHOLESTEROL AND A1C:  Recent Labs     12/01/21  0423   LABA1C 7.6*      Imaging Last 24 Hours:  CT HEAD WO CONTRAST    Result Date: 12/25/2021  PROCEDURE: CT HEAD WO CONTRAST CLINICAL INFORMATION: hx of metastaic cancer with brain mets, need to intiate anticoagulation, rule out hemorrhage. seizure activity.. COMPARISON: MRI scan of the brain dated 15th of June 2023.Marland Kitchen TECHNIQUE: Noncontrast 5 mm axial images were obtained through the brain. Sagittal and coronal reconstructions were obtained. All CT scans at this facility use dose modulation, iterative reconstruction, and/or weight-based dosing when appropriate to reduce radiation dose to as low as reasonably achievable. FINDINGS: There is a 14 x 16 mm mass in the left parietal lobe with extensive surrounding edema. There is an 8.7 x 8.2 mm mass in the left posterior temporal lobe with moderate surrounding edema There are postoperative changes involving the right temporal calvarium, left frontal and parietal calvarium. There is encephalomalacia in the right posterior temporal lobe. There is encephalomalacia in the right frontal lobe. There is no hemorrhage. There are no intra-or extra-axial collections.  There is no hydrocephalus or midline shift .  Marland Kitchen There are inflammatory changes in the frontal sinus.  The remaining paranasal sinuses and mastoid air cells are normally aerated.      1. Stable CT scan of the brain, no interval change since previous MRI scan dated  11/29/2021. 2. 14 x 16 mm mass in the left parietal lobe with extensive surrounding edema. 3. 8.7 x 8.2 mm mass in the left posterior temporal lobe with moderate surrounding edema. 4. Postoperative changes involving the right temporal calvarium, left frontal and parietal calvarium. 5. Encephalomalacia in the right posterior temporal and right frontal lobes. 6. Inflammatory changes in the frontal sinus. 7. No evidence of intracranial hemorrhage. **This report has been created using voice recognition software. It may contain minor errors which are inherent in voice recognition technology.** Final report electronically signed by DR Sherre Poot on 12/25/2021 2:46 PM    CT CHEST W CONTRAST    Result Date: 12/25/2021  PROCEDURE: CT CHEST W CONTRAST CLINICAL INFORMATION: Renal cell carcinoma of left kidney. Recent history of pulmonary emboli. COMPARISON: CT chest 09/26/2021. TECHNIQUE: 5 mm axial imaging through the chest with IV contrast. Coronal and sagittal reconstruction were performed. All CT scans at this facility use dose modulation, iterative reconstruction, and/or weight based dosing when appropriate to reduce the radiation dose to as low as reasonably achievable. CONTRAST: 80 cc Isovue-370. FINDINGS: Heart/mediastinum: The heart  size is normal. No pericardial effusion is observed. No aortic aneurysm or dissection is identified. Although not tailored for the detection of pulmonary arterial filling defects, a moderate saddle pulmonary embolus with right and left main pulmonary arterial filling defects extending into the upper and lower lobar branches is observed. No mediastinal, hilar, or axillary lymphadenopathy is visualized. Lungs: Numerous tiny scattered pulmonary nodules appear slightly more prominent and more numerous, for example a 7 mm left lower lobe pulmonary nodule (series 2, image 41) previously measured 4 mm. A 6 mm right lower lobe pulmonary nodule (series 2, image 33) previously measured 5 mm. There is a  suggestion of numerous scattered tiny bilateral upper lobe 2 to 3 mm nodules which are more evident (series 2, image 12 through 14). The 4 mm right lower lobe pulmonary nodule is stable (series 2, image 47).  A 4 mm right perihilar nodule is stable (series 2, image 28). No focal consolidation, pleural effusion, or pneumothorax is observed. Dependent atelectasis is noted at the lung bases. Upper abdomen: The limited images through the upper abdomen demonstrate multifocal hyperattenuating liver lesions suggestive of hypervascular metastasis. The largest lesions measure up to 25 mm in diameter with the majority of lesions measuring up to 15 mm. Musculoskeletal: The visualized skeletal structures appear intact.     1. Although not tailored for the detection of pulmonary emboli, a moderate saddle pulmonary embolus extending into the right and left main pulmonary arterial branches as well as the upper and lower lobar branches is observed. No right heart strain is visualized. 2. Multifocal metastatic pulmonary nodules measuring 2 to 7 mm appear larger in size and more numerous with measurements provided in the discussion. 3. Multifocal hyperattenuating liver lesions measuring up to 25 mm are noted in the limited images through the upper abdomen suggestive of hepatic metastasis. COMMUNICATION: The results of this examination were discussed with physician's assistant Arty Baumgartner receiving messages on behalf of Dr. Landry Mellow at 9:18 AM on 12/25/2021. **This report has been created using voice recognition software.  It may contain minor errors which are inherent in voice recognition technology.** Final report electronically signed by Dr Carolan Shiver on 12/25/2021 9:21 AM    MRI BRAIN W WO CONTRAST    Result Date: 12/25/2021  PROCEDURE: MRI BRAIN W WO CONTRAST CLINICAL INFORMATIONmetastatic cancer, mets to brain, seizures. COMPARISON: MRI scan of the brain dated 15th of June 2023.Marland Kitchen TECHNIQUE: Multiplanar and multiple  spin echo T1 and T2-weighted images were obtained through the brain before and after the administration of intravenous contrast. FINDINGS: There is a 1.8 x 1.5 cm enhancing lesion in the left parietal cortex with extensive surrounding edema, slightly larger than on previous study dated 11/29/2021 when it measured 1.7 x 1.3 cm in size. There is a 2.1 x 2.3 cm lesion in the left temporal lobe with moderate surrounding edema, larger than on previous study dated 11/29/2021 when it measured 1.6 x 1.3 cm in size. There is a 6.8 x 6.2 mm enhancing lesion in the left cerebellar hemisphere smaller than on previous study dated 11/29/2021 when  it measured 13 x 12 mm in size. There is an 8.5 x 3.3 mm lesion in the left posterior frontal cortex, smaller than on previous study dated 11/29/2021 when it measured 13 x 8 mm in size. There is an area of abnormal signal intensity in the right temporal lobe, unchanged. There are postoperative changes involving the right posterior temporal and occipital lobes, left frontal and parietal calvarium. There are  areas of encephalomalacia especially adjacent to the right sylvian fissure and in the right temporal and occipital lobes. There are inflammatory changes in the frontal sinus. The diffusion-weighted images are normal. The brain volume is normal.There are no intra-or extra-axial collections.  There is no hydrocephalus or midline shift . On the FLAIR and T2-weighted sequences, there is normal signal intensity in the brain. On the gradient echo T2-weighted images, there is mineralization in the medial aspects of the basal ganglia. There is susceptibility artifact in the left frontal lobe. The major intracranial vascular flow voids are present.  The midline craniocervical junction structures are normal.  The brainstem and pituitary gland are normal.     1. Interval deterioration since previous study dated 11/29/2021 with enlargement of the lesions in the left parietal and temporal lobes. There  is extensive edema surrounding the left parietal lesion and moderate edema surrounding the left temporal lesion. 2. There are lesions in the left frontal lobe and left cerebellar hemisphere slightly smaller than on previous study. 3. There are postoperative changes involving the left frontal and parietal calvarium, right temporal and occipital lobes. 4. Areas of encephalomalacia especially adjacent to the right sylvian fissure, in the right temporal and occipital lobes. 5. Inflammatory changes in the frontal sinus. **This report has been created using voice recognition software. It may contain minor errors which are inherent in voice recognition technology.** Final report electronically signed by DR Sherre Poot on 12/25/2021 4:00 PM        Assessment and Plan:        Seizures   MRI brain (7/11) Interval deterioration since previous study dated 11/29/2021 with enlargement of the lesions in the left parietal and temporal lobes. There is extensive edema surrounding the left parietal lesion and moderate edema surrounding the left temporal lesion.  Decadron for edema   Keppra 2gm load in the ER home Keppra 500mg  BID optimized to 1000mg  BID   Keprra level ordered   Seizure precautions       This case was discussed with Dr. Lyanne Co and he is in agreement with the assessment and plan.  No further inpatient neurologic work-up at this time.  Neurology will sign off.  Please do not hesitate to call our service if there are any questions or concerns.     Electronically signed by Terrence Dupont, APRN - CNP on 12/26/21 at 3:50 PM EDT

## 2021-12-26 NOTE — Progress Notes (Signed)
Echocardiogram complete at bedside.

## 2021-12-26 NOTE — Progress Notes (Signed)
Physician Progress Note      PATIENT:               Marcus Burnett, Marcus Burnett  CSN #:                  [id number]  DOB:                       08/05/1960  ADMIT DATE:       12/25/2021 10:30 AM  [place] DATE:  RESPONDING  PROVIDER #:        Otilio Jefferson PA-C          QUERY TEXT:    Pt admitted for further evaluation regarding his metastatic renal cancer,   metastatic cancer, mets to brain, seizures and saddle PE found on Chest CT.    Pt noted to have MRI brain with extensive edema surrounding the left parietal   lesion and moderate edema surrounding the left temporal lesion.  If clinically   significant, please make selection below ,document in progress notes and   carry through to discharge summary if you are evaluating/treating any of the   following:      The medical record reflects the following:  Risk Factors:  metastatic cancer, mets to brain  Clinical Indicators: MRI-brain with extensive edema surrounding the left   parietal lesion and moderate edema surrounding the left temporal lesion ,     Documentation " Focal seizure with retained awareness. Likely secondary to   significant intracranial metastases coupled with medication noncompliance".  Treatment: admission , imaging, monitoring of seizures , side effects and   symptoms, Hematology/ Oncology consult , Keppra for seizures and IV Decadron    Thank you,  Dawson Bills BSN, RN, [place]  Clinical Documentation Specialist  P: [phone]  F: [phone]  Options provided:  -- Cerebral edema  -- Brain compression  -- Cerebral edema and Brain compression  -- Other - I will add my own diagnosis  -- Disagree - Not applicable / Not valid  -- Disagree - Clinically unable to determine / Unknown  -- Refer to Clinical Documentation Reviewer    PROVIDER RESPONSE TEXT:    This patient has cerebral edema.    Query created by: Vashti Hey on 12/26/2021 7:34 AM      Electronically signed by:  Otilio Jefferson PA-C 12/26/2021 8:09 AM

## 2021-12-26 NOTE — Plan of Care (Signed)
Problem: Discharge Planning  Goal: Discharge to home or other facility with appropriate resources  Outcome: Progressing  Flowsheets (Taken 12/25/2021 2007)  Discharge to home or other facility with appropriate resources: Identify barriers to discharge with patient and caregiver     Problem: Safety - Adult  Goal: Free from fall injury  Outcome: Progressing

## 2021-12-26 NOTE — Progress Notes (Signed)
Internal Medicine Resident Progress Note    Name: Marcus Burnett, male, DOB: 21-Mar-1961, MRN: [id number]    PCP: [name], MD    Date of Admission: 12/25/2021  Date of Service: Pt seen/examined on 12/26/21      Assessment/Plan:  Acute Submassive Saddle Pulmonary Embolus, Present on Admission  - Identified on 12/25/2021 with extension into the right and left main pulmonary arterial branches  - PESI Score 112 points Class IV with 4-11.4% 30 day mortality secondary to history of heart failure and malignancy  - Heparin drip initiated  - Hematology/oncology consulted for anticoagulation recommendations enoxaparin versus DOAC versus warfarin    Renal Cell Carcinoma with mets with complications including encephalomalacia as well as surrounding edema and inflammatory changes.  -First diagnosed March 2022  - s/p May 2022 left radical nephrectomy with pathology consistent with clear cell carcinoma Clear grade 2  - Incidentally found left frontal lobe mass and left parietotemporal mass with midline shift  - s/p craniotomy for resection of occipital and occipital temporal tumors  - Repeat MRI brain June 2023 secondary to worsening right sided weakness; with evidence of interval development of lesion in left parietal cortex as well as enlargement of pre-existing lesions  - Patient seen and evaluated by neurosurgery 12/02/2021 and patient was considered poor surgical candidate and patient was documented to have refused any radiation therapy  - 12/07/2021 outpatient oncology evaluation with patient  wanting repeat CT chest before making a decision about immunotherapy; 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 Patient was willing to start a lenvatinib.   - 12/11/2021 Rad Onc evaluation; patient at that time was willing to consider palliative radiation therapy following results of repeat MRI  - Reached out to patient's radiation oncologist [name] on 12/26/2021 with plan for patient to talk to provider during admission.  Hematology/oncology consulted for advanced care recommendations  - Neurosurgery consulted for advanced care recommendations. Patient started on dexamethasone as well as levetiracetam for seizure prophylaxis  - Neurology consulted  - Ongoing GOC; Currently LIMITED x4    Diabetes Mellitus Type II, not on insulin  - 12/01/2021 7.6  - Outpatient: Glipzide, metformin, Trulicity  - patient started on long acting insulin with sliding scale with hypoglycemia protocol and POC glucose checks  - Likely to be exacerbated by corticosteroid administration    Chronic  HLD  Essential HTN          Expected discharge date:  Pending clinical course and GOC    Disposition:   '[x]'$  Home  '[]'$  Inpatient Rehab  '[]'$  Psychiatric Unit  '[]'$  SNF  '[]'$  [place]  '[]'$  Other-    ===================================================================      Marcus Complaint: Abnormal Testing Result    Hospital Course:   61 year old male with PMHx of RCC with brain mets with previous craniotomy presenting to [facility] at recommendation of oncologist secondary to abnormal staging CT chest showing findings consistent with saddle PE. Patient denied any acute symptoms or concerns in the outpatient setting. He was started on heparin. MRI ordered and showing new lesions as well as enlargement of previously identified mets with edema noted. He was started on dexamethasone as well as Keppra and neurology, oncology, and neurosurgery were consulted.     Subjective (past 24 hours):   Patient seen and evaluated at the bedside in no acute distress. He denies chest pain, respiratory distress, cough, fever, chills, N/V/D. He denies any symptoms prior to admission. [place] discussion held and patient states that he is  hoping for a miracle. He recognizes the severity of his illness and would like to hear Dr. Bradly Chris impression of his new MRI prior to advancing decisionmaking.      ROS: reviewed complete ROS unchanged unless otherwise stated in hospital course/subjective  portion.       Medications:  Reviewed    levETIRAcetam, 1,000 mg, Oral, BID    sodium chloride flush, 5-40 mL, IntraVENous, 2 times per day    amLODIPine, 5 mg, Oral, Daily    pravastatin, 80 mg, Oral, Daily    insulin lispro, 0-8 Units, SubCUTAneous, TID WC    insulin lispro, 0-4 Units, SubCUTAneous, Nightly    dexamethasone, 4 mg, IntraVENous, Q6H    pantoprazole, 40 mg, Oral, QAM AC         Intake/Output Summary (Last 24 hours) at 12/26/2021 2104  Last data filed at 12/26/2021 1849  Gross per 24 hour   Intake 1150 ml   Output 600 ml   Net 550 ml       Exam:  BP (!) 158/80   Pulse 76   Temp 97.7 F (36.5 C) (Oral)   Resp 18   Ht '5\' 10"'$  (1.778 m)   Wt 261 lb 7.5 oz (118.6 kg)   SpO2 98%   BMI 37.52 kg/m     General: No distress, appears stated age.  Eyes:  PERRL. Conjunctivae/corneas clear.  HENT: Head normal appearing. Nares normal. Oral mucosa moist.  Hearing intact.   Neck: Supple, with full range of motion. Trachea midline.  No gross JVD appreciated.  Respiratory:  Normal effort. Clear to auscultation, without rales or wheezes or rhonchi.  Cardiovascular: Normal rate, regular rhythm with normal S1/S2 without murmurs.    No lower extremity edema.   Abdomen: Soft, non-tender, non-distended with normal bowel sounds.  Musculoskeletal: No joint swelling or tenderness. Normal tone. No abnormal movements.   Skin: Warm and dry. No rashes or lesions.  Neurologic:  No focal sensory/motor deficits in the upper or lower extremities. Cranial nerves:  grossly non-focal 2-12.     Psychiatric: Alert and oriented, normal insight and thought content.   Capillary Refill: Brisk,< 3 seconds.  Peripheral Pulses: +2 palpable, equal bilaterally.       Labs:   Recent Labs     12/25/21  1050 12/26/21  0329   WBC 7.1 5.8   HGB 13.8* 13.3*   HCT 42.5 41.1*   PLT 348 348     Recent Labs     12/25/21  1050 12/26/21  0329   NA 137 135   K 4.8 4.8   CL 101 102   CO2 23 20*   BUN 15 15   CREATININE 1.3* 1.1   CALCIUM 9.5 9.5     No  results for input(s): AST, ALT, BILIDIR, BILITOT, ALKPHOS in the last 72 hours.  Recent Labs     12/25/21  1050   INR 0.86     No results for input(s): TROPONINT in the last 72 hours.  No results for input(s): PROCAL in the last 72 hours.   Lab Results   Component Value Date/Time    NITRU NEGATIVE 12/01/2021 05:42 PM    [place] NONE SEEN 12/01/2021 05:42 PM    BACTERIA NONE SEEN 12/01/2021 05:42 PM    RBCUA NONE SEEN 12/01/2021 05:42 PM    BLOODU NEGATIVE 12/01/2021 05:42 PM    SPECGRAV 1.014 12/01/2021 05:42 PM    GLUCOSEU NEGATIVE 07/23/2021 06:00 PM  Radiology: CT HEAD WO CONTRAST    Result Date: 12/25/2021  PROCEDURE: CT HEAD WO CONTRAST CLINICAL INFORMATION: hx of metastaic cancer with brain mets, need to intiate anticoagulation, rule out hemorrhage. seizure activity.. COMPARISON: MRI scan of the brain dated 15th of June 2023.Marland Kitchen TECHNIQUE: Noncontrast 5 mm axial images were obtained through the brain. Sagittal and coronal reconstructions were obtained. All CT scans at this facility use dose modulation, iterative reconstruction, and/or weight-based dosing when appropriate to reduce radiation dose to as low as reasonably achievable. FINDINGS: There is a 14 x 16 mm mass in the left parietal lobe with extensive surrounding edema. There is an 8.7 x 8.2 mm mass in the left posterior temporal lobe with moderate surrounding edema There are postoperative changes involving the right temporal calvarium, left frontal and parietal calvarium. There is encephalomalacia in the right posterior temporal lobe. There is encephalomalacia in the right frontal lobe. There is no hemorrhage. There are no intra-or extra-axial collections.  There is no hydrocephalus or midline shift .  Marland Kitchen There are inflammatory changes in the frontal sinus.  The remaining paranasal sinuses and mastoid air cells are normally aerated.      1. Stable CT scan of the brain, no interval change since previous MRI scan dated 11/29/2021. 2. 14 x 16 mm mass in the  left parietal lobe with extensive surrounding edema. 3. 8.7 x 8.2 mm mass in the left posterior temporal lobe with moderate surrounding edema. 4. Postoperative changes involving the right temporal calvarium, left frontal and parietal calvarium. 5. Encephalomalacia in the right posterior temporal and right frontal lobes. 6. Inflammatory changes in the frontal sinus. 7. No evidence of intracranial hemorrhage. **This report has been created using voice recognition software. It may contain minor errors which are inherent in voice recognition technology.** Final report electronically signed by DR Drue Flirt on 12/25/2021 2:46 PM    MRI BRAIN W WO CONTRAST    Result Date: 12/25/2021  PROCEDURE: MRI BRAIN W WO CONTRAST CLINICAL INFORMATIONmetastatic cancer, mets to brain, seizures. COMPARISON: MRI scan of the brain dated 15th of June 2023.Marland Kitchen TECHNIQUE: Multiplanar and multiple spin echo T1 and T2-weighted images were obtained through the brain before and after the administration of intravenous contrast. FINDINGS: There is a 1.8 x 1.5 cm enhancing lesion in the left parietal cortex with extensive surrounding edema, slightly larger than on previous study dated 11/29/2021 when it measured 1.7 x 1.3 cm in size. There is a 2.1 x 2.3 cm lesion in the left temporal lobe with moderate surrounding edema, larger than on previous study dated 11/29/2021 when it measured 1.6 x 1.3 cm in size. There is a 6.8 x 6.2 mm enhancing lesion in the left cerebellar hemisphere smaller than on previous study dated 11/29/2021 when  it measured 13 x 12 mm in size. There is an 8.5 x 3.3 mm lesion in the left posterior frontal cortex, smaller than on previous study dated 11/29/2021 when it measured 13 x 8 mm in size. There is an area of abnormal signal intensity in the right temporal lobe, unchanged. There are postoperative changes involving the right posterior temporal and occipital lobes, left frontal and parietal calvarium. There are areas of  encephalomalacia especially adjacent to the right sylvian fissure and in the right temporal and occipital lobes. There are inflammatory changes in the frontal sinus. The diffusion-weighted images are normal. The brain volume is normal.There are no intra-or extra-axial collections.  There is no hydrocephalus or midline shift . On the FLAIR and  T2-weighted sequences, there is normal signal intensity in the brain. On the gradient echo T2-weighted images, there is mineralization in the medial aspects of the basal ganglia. There is susceptibility artifact in the left frontal lobe. The major intracranial vascular flow voids are present.  The midline craniocervical junction structures are normal.  The brainstem and pituitary gland are normal.     1. Interval deterioration since previous study dated 11/29/2021 with enlargement of the lesions in the left parietal and temporal lobes. There is extensive edema surrounding the left parietal lesion and moderate edema surrounding the left temporal lesion. 2. There are lesions in the left frontal lobe and left cerebellar hemisphere slightly smaller than on previous study. 3. There are postoperative changes involving the left frontal and parietal calvarium, right temporal and occipital lobes. 4. Areas of encephalomalacia especially adjacent to the right sylvian fissure, in the right temporal and occipital lobes. 5. Inflammatory changes in the frontal sinus. **This report has been created using voice recognition software. It may contain minor errors which are inherent in voice recognition technology.** Final report electronically signed by DR Drue Flirt on 12/25/2021 4:00 PM        DVT prophylaxis:    '[]'$  Lovenox  '[]'$  SCDs  '[]'$  SQ Heparin  '[]'$  Encourage ambulation   '[x]'$  Already on Anticoagulation  Diet: ADULT DIET; Regular  Code Status: Limited  PT/OT: Consulted  Tele: Continuous  IVF: None    Electronically signed by Rolm Gala, MD on 12/26/2021 at 9:04 PM    Case was discussed with  Attending, Dr. Elba Barman

## 2021-12-26 NOTE — Progress Notes (Signed)
Pharmacy Medication History Note      List of current medications patient is taking is complete.    Source of information: Patient and outside dispense history     Changes made to medication list:  Medications removed (include reason, ex. therapy complete or physician discontinued):  None    Medications added/doses adjusted:  Adjusted directions to levetiracetam. Pt was directed by doctor to take 1 tab BID but was taking 1 tab daily.    Added multivitamin     Other notes (ex. Recent course of antibiotics, Coumadin dosing):  Denies use of other OTC or herbal medications.      Allergies reviewed      Electronically signed by Herbie Almont on 12/26/2021 at 11:06 AM

## 2021-12-26 NOTE — Care Coordination-Inpatient (Signed)
12/26/21 0855   Readmission Assessment   Number of Days since last admission? 8-30 days   Previous Disposition Home with Family   Who is being Interviewed Patient   What was the patient's/caregiver's perception as to why they think they needed to return back to the hospital?   (had follow up CT-showed blood clot)   Did you visit your Primary Care Physician after you left the hospital, before you returned this time? Yes   Did you see a specialist, such as Cardiac, Pulmonary, Orthopedic Physician, etc. after you left the hospital? Yes   Who advised the patient to return to the hospital? Physician's Nurse/Office staff   Does the patient report anything that got in the way of taking their medications? No   In our efforts to provide the best possible care to you and others like you, can you think of anything that we could have done to help you after you left the hospital the first time, so that you might not have needed to return so soon? Other (Comment)  (nothing)

## 2021-12-26 NOTE — Care Coordination-Inpatient (Signed)
Case Management Assessment  Initial Evaluation    Date/Time of Evaluation: 12/26/2021 8:53 AM  Assessment Completed by: Suzy Bouchard, RN    If patient is discharged prior to next notation, then this note serves as note for discharge by case management.    Patient Name: Marcus Burnett                   Date of Birth: 06-12-1961  Diagnosis: Acute saddle pulmonary embolism without acute cor pulmonale ([place]) [I26.92]  Pulmonary embolism without acute cor pulmonale, unspecified chronicity, unspecified pulmonary embolism type ([place]) [I26.99]                   Date / Time: 12/25/2021 10:30 AM  Location: 4K-07/007-A     Patient Admission Status: Inpatient   Readmission Risk Low 0-14, Mod 15-19), High > 20: Readmission Risk Score: 16.9    Current PCP: [name], MD  PCP verified by CM? Yes    Chart Reviewed: Yes      History Provided by: Patient  Patient Orientation: Alert and Oriented    Patient Cognition: Alert    Hospitalization in the last 30 days (Readmission):  yes  If yes, Readmission Assessment in CM Navigator will be completed.    Advance Directives:      Code Status: Limited   Patient's Primary Decision Maker is: Named in [place]    Primary Decision Maker: Fritz Pickerel - Spouse - [phone]    Discharge Planning:    Patient lives with: Spouse/Significant Other, Family Members Type of Home: House  Primary Care Giver: Self  Patient Support Systems include: Spouse/Significant Other, Children   Current Financial resources: Medicaid  Current community resources: None  Current services prior to admission: Museum/gallery conservator            Current DME: Wheelchair, Environmental consultant, Cane            Type of Home Care services:  None    ADLS  Prior functional level: Independent in ADLs/IADLs  Current functional level: Independent in ADLs/IADLs    Family can provide assistance at DC: Yes  Would you like Case Management to discuss the discharge plan with any other family members/significant others, and if so,  who? No  Plans to Return to Present Housing: Yes  Other Identified Issues/Barriers to RETURNING to current housing: None  Potential Assistance needed at discharge: N/A            Potential DME:    Patient expects to discharge to: [place] for transportation at discharge: Family    Financial    Payor: [place] / Plan: [place] / Product Type: *No Product type* /     Does insurance require precert for SNF: Yes    Potential assistance Purchasing Medications: No  Meds-to-Beds request: Yes      [place], [place] - [address] [place]  [address] [place] [address]  Phone: [phone] Fax: [phone]      Notes:    Factors facilitating achievement of predicted outcomes: Family support, Motivated, Cooperative, Pleasant, Sense of humor, Good insight into deficits, and Has needed Durable Medical Equipment at home    Barriers to discharge: Medication management ([place]in)    Additional Case Management Notes: Pt admitted through ER after OP abnormal CT chest. On tele, Heparin gtt,  IV decadron q6hr, oncology consulted, neuro consulted,     Procedure: 7/11: OP CT chest: Although not tailored for the detection of pulmonary emboli, a moderate saddle pulmonary embolus extending into the right and left main pulmonary arterial branches as well as the upper and lower lobar branches is observed. No right heart strain is visualized. Multifocal metastatic pulmonary nodules measuring 2 to 7 mm appear larger in size and more numerous with measurements provided in the discussion. Multifocal hyperattenuating liver lesions measuring up to 25 mm are noted in the limited images through the upper abdomen suggestive of hepatic metastasis  7/11: MRI brain: Interval deterioration since previous study dated 11/29/2021 with enlargement of the lesions in the left parietal and temporal lobes. There is extensive edema surrounding  the left parietal lesion and moderate edema surrounding the left temporal lesion. There are lesions in the left frontal lobe and left cerebellar hemisphere slightly smaller than on previous study. There are postoperative changes involving the left frontal and parietal calvarium, right temporal and occipital lobes. Areas of encephalomalacia especially adjacent to the right sylvian fissure, in the right temporal and occipital lobes. Inflammatory changes in the frontal sinus  7/12: ECHO pending    The Plan for Transition of Care is related to the following treatment goals of Acute saddle pulmonary embolism without acute cor pulmonale (H[facility]) [I26.92]  Pulmonary embolism without acute cor pulmonale, unspecified chronicity, unspecified pulmonary embolism type ([place]) [I26.99]    Patient Goals/Plan/Treatment Preferences: Spoke with pt and wife Sheryl. Pt plans on returning home with spouse and mother in law. Has needed DME at home. Declines additional needs for [facility]. Supportive family. Independent in ADL's. Verified PCP and insurance. Will continue to follow for home going discharge needs.  Transportation/Food Security/Housekeeping Addressed: No issues identified.     Suzy Bouchard, RN  Case Management Department

## 2021-12-26 NOTE — Progress Notes (Signed)
2258: Notified hospitalist on call of patients anti xa lab results. This nurse clarified the heprin's drips order d/t there being no prn heparin boluses available and per Dr. Jola Babinski note he said to defer boluses of heparin d/t Patients extent of intracranial metastes. Hospitalist said to follow heparin orders by either increasing or decreasing the heparin drip without doing boluses of heparin.

## 2021-12-26 NOTE — Plan of Care (Signed)
Problem: Discharge Planning  Goal: Discharge to home or other facility with appropriate resources  12/26/2021 0945 by Junius Finner, RN  Outcome: Progressing  Flowsheets (Taken 12/26/2021 0945)  Discharge to home or other facility with appropriate resources:   Identify barriers to discharge with patient and caregiver   Arrange for needed discharge resources and transportation as appropriate   Identify discharge learning needs (meds, wound care, etc)     Problem: Safety - Adult  Goal: Free from fall injury  12/26/2021 0945 by Junius Finner, RN  Outcome: Progressing  Flowsheets (Taken 12/26/2021 0945)  Free From Fall Injury:   Instruct family/caregiver on patient safety   Based on caregiver fall risk screen, instruct family/caregiver to ask for assistance with transferring infant if caregiver noted to have fall risk factors     Problem: Chronic Conditions and Co-morbidities  Goal: Patient's chronic conditions and co-morbidity symptoms are monitored and maintained or improved  Outcome: Progressing  Flowsheets  Taken 12/26/2021 0945 by Junius Finner, [place] - Patient's Chronic Conditions and Co-Morbidity Symptoms are Monitored and Maintained or Improved:   Monitor and assess patient's chronic conditions and comorbid symptoms for stability, deterioration, or improvement   Collaborate with multidisciplinary team to address chronic and comorbid conditions and prevent exacerbation or deterioration   Update acute care plan with appropriate goals if chronic or comorbid symptoms are exacerbated and prevent overall improvement and discharge  Taken 12/25/2021 2007 by Melina Modena, [place] - Patient's Chronic Conditions and Co-Morbidity Symptoms are Monitored and Maintained or Improved: Monitor and assess patient's chronic conditions and comorbid symptoms for stability, deterioration, or improvement     Care plan reviewed with patient and wife.  Patient and wife verbalize understanding of the plan of care and contribute to  goal setting.

## 2021-12-26 NOTE — Plan of Care (Signed)
Problem: Discharge Planning  Goal: Discharge to home or other facility with appropriate resources  Outcome: Progressing     Problem: Safety - Adult  Goal: Free from fall injury  Outcome: Progressing     Problem: Chronic Conditions and Co-morbidities  Goal: Patient's chronic conditions and co-morbidity symptoms are monitored and maintained or improved  Outcome: Progressing

## 2021-12-27 ENCOUNTER — Inpatient Hospital Stay: Admit: 2021-12-27 | Payer: MEDICAID | Primary: Family Medicine

## 2021-12-27 LAB — BASIC METABOLIC PANEL
BUN: 27 mg/dL — ABNORMAL HIGH (ref 7–22)
BUN: 31 mg/dL — ABNORMAL HIGH (ref 7–22)
CO2: 19 meq/L — ABNORMAL LOW (ref 23–33)
CO2: 12 meq/L — ABNORMAL LOW (ref 23–33)
Calcium: 9.8 mg/dL (ref 8.5–10.5)
Calcium: 9.6 mg/dL (ref 8.5–10.5)
Chloride: 100 meq/L (ref 98–111)
Chloride: 99 meq/L (ref 98–111)
Creatinine: 1.4 mg/dL — ABNORMAL HIGH (ref 0.4–1.2)
Creatinine: 1.5 mg/dL — ABNORMAL HIGH (ref 0.4–1.2)
Glucose: 337 mg/dL — ABNORMAL HIGH (ref 70–108)
Glucose: 434 mg/dL — ABNORMAL HIGH (ref 70–108)
Potassium: 4.4 meq/L (ref 3.5–5.2)
Potassium: 5.2 meq/L (ref 3.5–5.2)
Sodium: 135 meq/L (ref 135–145)
Sodium: 133 meq/L — ABNORMAL LOW (ref 135–145)

## 2021-12-27 LAB — HEMOGLOBIN A1C
Estimated Avg Glucose: 171 mg/dL — ABNORMAL HIGH (ref 70–126)
Hemoglobin A1C: 7.7 % — ABNORMAL HIGH (ref 4.4–6.4)

## 2021-12-27 LAB — URINALYSIS
Bilirubin Urine: NEGATIVE
Blood, Urine: NEGATIVE
Glucose, Urine: >=1000 mg/dl — AB
Ketones, Urine: NEGATIVE
Leukocytes, UA: NEGATIVE
Nitrite, Urine: NEGATIVE
Protein, UA: NEGATIVE mg/dl
Specific Gravity, UA: 1.026 (ref 1.002–1.030)
Urobilinogen, Urine: 1 eu/dl (ref 0.0–1.0)
pH, UA: 6 (ref 5.0–9.0)

## 2021-12-27 LAB — GLOMERULAR FILTRATION RATE, ESTIMATED
Est, Glom Filt Rate: 56 mL/min/{1.73_m2} — AB (ref >60)
Est, Glom Filt Rate: 52 mL/min/{1.73_m2} — AB (ref >60)

## 2021-12-27 LAB — ANTI-XA, UNFRACTIONATED HEPARIN
Heparin Unfractionated: 1.59 U/ml (ref 0.30–0.70)
Heparin Unfractionated: 1.74 U/ml (ref 0.30–0.70)
Heparin Unfractionated: 1.38 U/ml (ref 0.30–0.70)

## 2021-12-27 LAB — POCT GLUCOSE
POC Glucose: 314 mg/dl — ABNORMAL HIGH (ref 70–108)
POC Glucose: 316 mg/dl — ABNORMAL HIGH (ref 70–108)
POC Glucose: 380 mg/dl — ABNORMAL HIGH (ref 70–108)
POC Glucose: 381 mg/dl — ABNORMAL HIGH (ref 70–108)
POC Glucose: 384 mg/dl — ABNORMAL HIGH (ref 70–108)
POC Glucose: 388 mg/dl — ABNORMAL HIGH (ref 70–108)
POC Glucose: 401 mg/dl — ABNORMAL HIGH (ref 70–108)
POC Glucose: 441 mg/dl — ABNORMAL HIGH (ref 70–108)

## 2021-12-27 LAB — CBC
Hematocrit: 41.9 % — ABNORMAL LOW (ref 42.0–52.0)
Hemoglobin: 13.6 gm/dl — ABNORMAL LOW (ref 14.0–18.0)
MCH: 27.8 pg (ref 26.0–33.0)
MCHC: 32.5 gm/dl (ref 32.2–35.5)
MCV: 85.7 fL (ref 80.0–94.0)
MPV: 9.9 fL (ref 9.4–12.4)
Platelets: 403 10*3/uL — ABNORMAL HIGH (ref 130–400)
RBC: 4.89 10*6/uL (ref 4.70–6.10)
RDW-CV: 14.3 % (ref 11.5–14.5)
RDW-SD: 44.1 fL (ref 35.0–45.0)
WBC: 12.7 10*3/uL — ABNORMAL HIGH (ref 4.8–10.8)

## 2021-12-27 LAB — MAGNESIUM: Magnesium: 2.1 mg/dL (ref 1.6–2.4)

## 2021-12-27 LAB — ANION GAP
Anion Gap: 16 meq/L (ref 8.0–16.0)
Anion Gap: 22 meq/L — ABNORMAL HIGH (ref 8.0–16.0)

## 2021-12-27 LAB — LACTIC ACID: Lactic Acid: 4.4 mmol/L — ABNORMAL HIGH (ref 0.5–2.0)

## 2021-12-27 MED ORDER — INSULIN LISPRO 100 UNIT/ML IJ SOLN
100 | Freq: Once | INTRAMUSCULAR | Status: AC
Start: 2021-12-27 — End: 2021-12-27
  Administered 2021-12-27: 19:00:00 12 [IU] via SUBCUTANEOUS

## 2021-12-27 MED ORDER — ENOXAPARIN SODIUM 120 MG/0.8ML IJ SOSY
1200.8 MG/0.8ML | Freq: Two times a day (BID) | INTRAMUSCULAR | Status: DC
Start: 2021-12-27 — End: 2021-12-28
  Administered 2021-12-27 – 2021-12-28 (×2): 120 mg/kg via SUBCUTANEOUS

## 2021-12-27 MED ORDER — DEXAMETHASONE SODIUM PHOSPHATE 4 MG/ML IJ SOLN
4 MG/ML | Freq: Four times a day (QID) | INTRAMUSCULAR | Status: DC
Start: 2021-12-27 — End: 2021-12-28

## 2021-12-27 MED ORDER — LACTATED RINGERS IV SOLN
INTRAVENOUS | Status: DC
Start: 2021-12-27 — End: 2021-12-27
  Administered 2021-12-27: 15:00:00 via INTRAVENOUS

## 2021-12-27 MED ORDER — DEXAMETHASONE SODIUM PHOSPHATE 4 MG/ML IJ SOLN
4 MG/ML | Freq: Four times a day (QID) | INTRAMUSCULAR | Status: AC
Start: 2021-12-27 — End: 2022-01-15

## 2021-12-27 MED ORDER — INSULIN GLARGINE 100 UNIT/ML SC SOLN
100 UNIT/ML | Freq: Every evening | SUBCUTANEOUS | Status: AC
Start: 2021-12-27 — End: 2021-12-28
  Administered 2021-12-27 – 2021-12-28 (×2): 20 [IU] via SUBCUTANEOUS

## 2021-12-27 MED ORDER — INSULIN LISPRO 100 UNIT/ML IJ SOLN
100 | Freq: Once | INTRAMUSCULAR | Status: AC
Start: 2021-12-27 — End: 2021-12-27
  Administered 2021-12-27: 21:00:00 12 [IU] via SUBCUTANEOUS

## 2021-12-27 MED ORDER — DEXAMETHASONE SODIUM PHOSPHATE 4 MG/ML IJ SOLN
4 MG/ML | Freq: Two times a day (BID) | INTRAMUSCULAR | Status: AC
Start: 2021-12-27 — End: 2022-01-22

## 2021-12-27 MED ORDER — LACTATED RINGERS IV SOLN
INTRAVENOUS | Status: AC
Start: 2021-12-27 — End: 2021-12-28
  Administered 2021-12-27: 23:00:00 via INTRAVENOUS

## 2021-12-27 MED FILL — DEXAMETHASONE SODIUM PHOSPHATE 4 MG/ML IJ SOLN: 4 MG/ML | INTRAMUSCULAR | Qty: 1

## 2021-12-27 MED FILL — HUMALOG 100 UNIT/ML IJ SOLN: 100 UNIT/ML | INTRAMUSCULAR | Qty: 6

## 2021-12-27 MED FILL — LANTUS 100 UNIT/ML SC SOLN: 100 UNIT/ML | SUBCUTANEOUS | Qty: 20

## 2021-12-27 MED FILL — HUMALOG 100 UNIT/ML IJ SOLN: 100 UNIT/ML | INTRAMUSCULAR | Qty: 8

## 2021-12-27 MED FILL — ENOXAPARIN SODIUM 120 MG/0.8ML IJ SOSY: 120 MG/0.8ML | INTRAMUSCULAR | Qty: 0.8

## 2021-12-27 MED FILL — HUMALOG 100 UNIT/ML IJ SOLN: 100 UNIT/ML | INTRAMUSCULAR | Qty: 12

## 2021-12-27 MED FILL — HEPARIN SOD (PORCINE) IN D5W 100 UNIT/ML IV SOLN: 100 UNIT/ML | INTRAVENOUS | Qty: 250

## 2021-12-27 MED FILL — HUMALOG 100 UNIT/ML IJ SOLN: 100 UNIT/ML | INTRAMUSCULAR | Qty: 4

## 2021-12-27 MED FILL — PANTOPRAZOLE SODIUM 40 MG PO TBEC: 40 MG | ORAL | Qty: 1

## 2021-12-27 NOTE — Progress Notes (Signed)
Evaluated  Lovenox pricing for Rolm Gala, MD    Lovenox is covered 100% by patient Medicaid insurance plan.     If there are any questions, please call me. Thank you!     Aundria Rud, CPhT - Prescription Assistance (ext. [phone]) 12/27/2021,12:26 PM

## 2021-12-27 NOTE — Progress Notes (Signed)
Handoff report was called to [facility] on 8B. No questions or concerns expressed at this time.
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
## 2021-12-27 NOTE — Progress Notes (Signed)
Internal Medicine Resident Progress Note    Name: Marcus Burnett, male, DOB: [birth year], MRN: [id number]    PCP: Marcus Chris, MD    Date of Admission: 12/25/2021  Date of Service: Pt seen/examined on 12/27/21      Assessment/Plan:  Acute Submassive Saddle Pulmonary Embolus, Present on Admission  - Identified on 12/25/2021 with extension into the right and left main pulmonary arterial branches  - PESI Score 112 points Class IV with 4-11.4% 30 day mortality secondary to history of heart failure and malignancy  - Heparin drip initiated at admission  - Hematology/oncology consulted for anticoagulation recommendations enoxaparin versus DOAC versus warfarin  - Anticoagulation reviewed extensively with primary RN as well as Marcus Burnett PharmD. Heparin drip discontinued and patient started on '1mg'$ /kg BID Lovenox.    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  -First diagnosed March 2022  - s/p May 2022 left radical nephrectomy with pathology consistent with clear cell carcinoma Clear grade 2  - Incidentally found left frontal lobe mass and left parietotemporal mass with midline shift  - s/p craniotomy for resection of occipital and occipital temporal tumors  - Repeat MRI brain June 2023 secondary to worsening right sided weakness; with evidence of interval development of lesion in left parietal cortex as well as enlargement of pre-existing lesions  - Patient seen and evaluated by neurosurgery 12/02/2021 and patient was considered poor surgical candidate and patient was documented to have refused any radiation therapy  - 12/07/2021 outpatient oncology evaluation with patient  wanting repeat CT chest before making a decision about immunotherapy; 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 Patient was willing to start a lenvatinib.   - 12/11/2021 Rad Onc evaluation; patient at that time was willing to consider palliative radiation therapy  following results of repeat MRI  - Reached out to patient's radiation oncologist [name] on 12/26/2021 with plan for patient to talk to provider during admission. Hematology/oncology consulted for advanced care recommendations; patient discussed case with [name] on 12/26/2021. He has also previously extensively discussed immunotherapy versus radiation versus lenvatinib. The patient has previously declined systemic therapy or radiation therapy. He has expressed concerns about the ability of the lenvatinib to cross the blood brain barrier to address his brain mets. The patient once again reiterates that he would not want to proceed with radiation at this time and would like to follow up with [name] for further evaluation of his treatment options in the outpatient setting.   - Neurosurgery consulted for advanced care recommendations. Case discussed with Neurosurgery APP; patient is well known to the service and has previously declined any surgical intervention. Patient started on dexamethasone as well as levetiracetam for seizure prophylaxis. Plan for steroid taper with orders placed to facilitate.   - Neurology consulted; keppra dosing titrated.   - Ongoing GOC; Currently LIMITED x4; the patient has been informed that his malignancy is metastatic and not curable by multiple subspecialty services.     Diabetes Mellitus Type II, not on insulin  - 12/01/2021 7.6  - Outpatient: Glipzide, metformin, Trulicity  - patient started on long acting insulin with sliding scale with hypoglycemia protocol and POC glucose checks; titrated on 07/13 with repeat lantus dose ordered for 07/14 as well as scheduled mealtime insulin  - Likely to be exacerbated by corticosteroid administration    Diastolic Heart Failure with Preserved Ejection Fraction, Not in Acute Exacerbation  -12/26/2021 ECHO showing EF 55 to 60% with grade  1 diastolic dysfunction  -Outpatient GDMT: None  - Recommendation for outpatient followup with  cardiology and heart failure clinic for optimization of GDMT. Given PMHx of DMII, patient may be an appropriate candidate for initiating SGLT2i    Acute Kidney Injury  Type A Lactic Acidosis  HAGMA  - Suspected pre-renal secondary to concurrent heart failure as well as compromised flow secondary to thromboembolic burden in pulmonary vasculature  - With accompanying lactic acidosis secondary to systemic hypoperfusion  -Patient started on LR with repeat BMP in AM  - Renal US ordered for further evaluation     Pseudohyponatremia secondary to hyperglycemia  Reactive Leukocytosis secondary to CST administration    Chronic  HLD  Essential HTN          Expected discharge date:  Pending clinical course and GOC    Disposition:   '[x]'$  Home  '[]'$  Inpatient Rehab  '[]'$  Psychiatric Unit  '[]'$  SNF  '[]'$  [place]  '[]'$  Other-    ===================================================================      Chief Complaint: Abnormal Testing Result    Hospital Course:   61 year old male with PMHx of RCC with brain mets with previous craniotomy presenting to [name] at recommendation of oncologist secondary to abnormal staging CT chest showing findings consistent with saddle PE. Patient denied any acute symptoms or concerns in the outpatient setting. He was started on heparin. MRI ordered and showing new lesions as well as enlargement of previously identified mets with edema noted. He was started on dexamethasone as well as Keppra and neurology, oncology, and neurosurgery were consulted.     Subjective (past 24 hours):   Patient seen and evaluated at the bedside in no acute distress with wife.  He states that he discussed his case with Dr. Patient up and has decided to forego radiation therapy until he has a chance to discuss medication options which will possibly cross the blood-brain barrier to address his brain metastasis.  Patient denies any acute symptoms or concerns.  He states that he is tolerating his heparin drip without  complication.  Case discussed with neurosurgery APP at bedside and he verbalizes agreement with the current dexamethasone and seizure prophylaxis regimen; he emphasizes that the patient has previously refused neurosurgical intervention and that he is a poor surgical candidate.  Case discussed with hematology/oncology APP with attending hospitalist; patient again displays poor insight into the prognosis of his condition as well as the efficacy of his treatment options if he continues to delay treatment.  Case discussed with pharmacist Marcus Burnett as well as primary RN regarding anticoagulation.  Plan for Lovenox administration initiated on 12/27/2021.  Case discussed with patient's wife and she expresses that the patient's cognitive condition has declined over the last year and that he is having difficulty accepting his prognosis.    ROS: reviewed complete ROS unchanged unless otherwise stated in hospital course/subjective portion.       Medications:  Reviewed    enoxaparin, 1 mg/kg, SubCUTAneous, Q12H    [START ON 01-08-22] dexamethasone, 2 mg, IntraVENous, Q6H **FOLLOWED BY** [START ON 01/09/2022] dexamethasone, 1 mg, IntraVENous, Q6H **FOLLOWED BY** [START ON 01/16/2022] dexamethasone, 1 mg, IntraVENous, Q12H    levETIRAcetam, 1,000 mg, Oral, BID    insulin glargine, 20 Units, SubCUTAneous, Nightly    sodium chloride flush, 5-40 mL, IntraVENous, 2 times per day    amLODIPine, 5 mg, Oral, Daily    pravastatin, 80 mg, Oral, Daily    insulin lispro, 0-8 Units, SubCUTAneous, TID WC  insulin lispro, 0-4 Units, SubCUTAneous, Nightly    dexamethasone, 4 mg, IntraVENous, Q6H    pantoprazole, 40 mg, Oral, QAM AC         Intake/Output Summary (Last 24 hours) at 12/27/2021 2241  Last data filed at 12/27/2021 1603  Gross per 24 hour   Intake 1400 ml   Output --   Net 1400 ml       Exam:  BP 139/75   Pulse 61   Temp 97.7 F (36.5 C) (Oral)   Resp 19   Ht '5\' 10"'$  (1.778 m)   Wt 267 lb 11.2 oz (121.4 kg)   SpO2 94%   BMI  38.41 kg/m     General: No distress, appears stated age.  Eyes:  PERRL. Conjunctivae/corneas clear.  HENT: Head normal appearing. Nares normal. Oral mucosa moist.  Hearing intact.   Neck: Supple, with full range of motion. Trachea midline.  No gross JVD appreciated.  Respiratory: Diminished breath sounds bilaterally with no wheezes, crackles, or rhonchi.  Cardiovascular: Normal rate, regular rhythm with normal S1/S2 without murmurs.    No lower extremity edema.   Abdomen: Soft, non-tender, non-distended with normal bowel sounds.  Musculoskeletal: No joint swelling or tenderness. Normal tone. No abnormal movements.   Skin: Warm and dry. No rashes or lesions.  Neurologic:  No focal sensory/motor deficits in the upper or lower extremities. Cranial nerves:  grossly non-focal 2-12.     Psychiatric: Alert and oriented, normal insight and thought content.  Slow responses, displays word finding  Capillary Refill: Brisk,< 3 seconds.  Peripheral Pulses: +2 palpable, equal bilaterally.       Labs:   Recent Labs     12/25/21  1050 12/26/21  0329 12/27/21  0812   WBC 7.1 5.8 12.7*   HGB 13.8* 13.3* 13.6*   HCT 42.5 41.1* 41.9*   PLT 348 348 403*     Recent Labs     12/26/21  0329 12/27/21  0812 12/27/21  1355   NA 135 135 133*   K 4.8 4.4 5.2   CL 102 100 99   CO2 20* 19* 12*   BUN 15 27* 31*   CREATININE 1.1 1.4* 1.5*   CALCIUM 9.5 9.8 9.6     No results for input(s): AST, ALT, BILIDIR, BILITOT, ALKPHOS in the last 72 hours.  Recent Labs     12/25/21  1050   INR 0.86     No results for input(s): TROPONINT in the last 72 hours.  No results for input(s): PROCAL in the last 72 hours.   Lab Results   Component Value Date/Time    NITRU NEGATIVE 12/27/2021 07:00 PM    WBCUA NONE SEEN 12/01/2021 05:42 PM    BACTERIA NONE SEEN 12/01/2021 05:42 PM    RBCUA NONE SEEN 12/01/2021 05:42 PM    BLOODU NEGATIVE 12/27/2021 07:00 PM    SPECGRAV 1.026 12/27/2021 07:00 PM    GLUCOSEU NEGATIVE 07/23/2021 06:00 PM       DVT prophylaxis:    '[]'$   Lovenox  '[]'$  SCDs  '[]'$  SQ Heparin  '[]'$  Encourage ambulation   '[x]'$  Already on Anticoagulation  Diet: ADULT DIET; Regular  Code Status: Limited  PT/OT: Consulted  Tele: Continuous  IVF: None    Electronically signed by Rolm Gala, MD on 12/27/2021 at 10:41 PM    Case was discussed with Attending, Dr. Elba Barman

## 2021-12-27 NOTE — Care Coordination-Inpatient (Signed)
12/27/21, 1:21 PM EDT    DISCHARGE ON GOING [facility] day: 2  Location: 4K-07/007-A Reason for admit: Acute saddle pulmonary embolism without acute cor pulmonale (HCC) [I26.92]  Pulmonary embolism without acute cor pulmonale, unspecified chronicity, unspecified pulmonary embolism type ([place]) [I26.99]   Procedure:   7/11: OP CT chest: Although not tailored for the detection of pulmonary emboli, a moderate saddle pulmonary embolus extending into the right and left main pulmonary arterial branches as well as the upper and lower lobar branches is observed. No right heart strain is visualized. Multifocal metastatic pulmonary nodules measuring 2 to 7 mm appear larger in size and more numerous with measurements provided in the discussion. Multifocal hyperattenuating liver lesions measuring up to 25 mm are noted in the limited images through the upper abdomen suggestive of hepatic metastasis  7/11: MRI brain: Interval deterioration since previous study dated 11/29/2021 with enlargement of the lesions in the left parietal and temporal lobes. There is extensive edema surrounding the left parietal lesion and moderate edema surrounding the left temporal lesion. There are lesions in the left frontal lobe and left cerebellar hemisphere slightly smaller than on previous study. There are postoperative changes involving the left frontal and parietal calvarium, right temporal and occipital lobes. Areas of encephalomalacia especially adjacent to the right sylvian fissure, in the right temporal and occipital lobes. Inflammatory changes in the frontal sinus  7/12: ECHO: EF 55-60%. Grade 1 diastolic dysfunction.     Barriers to Discharge: Hospitalist, Neurosurgery and Oncology following. Afebrile. Room air. Heparin gtt. Decadron '4mg'$  iv q6hr, IVF, diabetes management. BUN 27, creatinine 1.4. WBC 12.7.     PCP: [name], MD  Readmission Risk Score: 16.5%  Patient Goals/Plan/Treatment Preferences: Plans  home with wife and MIL. Monitor for needs.

## 2021-12-27 NOTE — Other (Signed)
Staff in room with pt at this time

## 2021-12-28 ENCOUNTER — Ambulatory Visit: Admit: 2021-12-28 | Discharge: 2021-12-28 | Payer: MEDICAID | Attending: Medical Oncology | Primary: Family Medicine

## 2021-12-28 ENCOUNTER — Inpatient Hospital Stay: Admit: 2021-12-28 | Payer: MEDICAID | Primary: Family Medicine

## 2021-12-28 DIAGNOSIS — C642 Malignant neoplasm of left kidney, except renal pelvis: Secondary | ICD-10-CM

## 2021-12-28 LAB — BASIC METABOLIC PANEL
BUN: 30 mg/dL — ABNORMAL HIGH (ref 7–22)
CO2: 21 meq/L — ABNORMAL LOW (ref 23–33)
Calcium: 9.1 mg/dL (ref 8.5–10.5)
Chloride: 104 meq/L (ref 98–111)
Creatinine: 1.3 mg/dL — ABNORMAL HIGH (ref 0.4–1.2)
Glucose: 214 mg/dL — ABNORMAL HIGH (ref 70–108)
Potassium: 4.7 meq/L (ref 3.5–5.2)
Sodium: 137 meq/L (ref 135–145)

## 2021-12-28 LAB — CBC
Hematocrit: 35.2 % — ABNORMAL LOW (ref 42.0–52.0)
Hemoglobin: 11.6 gm/dl — ABNORMAL LOW (ref 14.0–18.0)
MCH: 27.8 pg (ref 26.0–33.0)
MCHC: 33 gm/dl (ref 32.2–35.5)
MCV: 84.2 fL (ref 80.0–94.0)
MPV: 10 fL (ref 9.4–12.4)
Platelets: 332 10*3/uL (ref 130–400)
RBC: 4.18 10*6/uL — ABNORMAL LOW (ref 4.70–6.10)
RDW-CV: 14.4 % (ref 11.5–14.5)
RDW-SD: 43.9 fL (ref 35.0–45.0)
WBC: 11.4 10*3/uL — ABNORMAL HIGH (ref 4.8–10.8)

## 2021-12-28 LAB — GLOMERULAR FILTRATION RATE, ESTIMATED: Est, Glom Filt Rate: >60 mL/min/{1.73_m2} (ref >60)

## 2021-12-28 LAB — EKG 12-LEAD
Atrial Rate: 62 {beats}/min
P Axis: 17 degrees
P-R Interval: 178 ms
Q-T Interval: 426 ms
QRS Duration: 88 ms
QTc Calculation (Bazett): 432 ms
R Axis: 9 degrees
T Axis: 64 degrees
Ventricular Rate: 62 {beats}/min

## 2021-12-28 LAB — LEVETIRACETAM LEVEL: Levetiracetam Lvl: 15 ug/mL (ref 10–40)

## 2021-12-28 LAB — ANION GAP: Anion Gap: 12 meq/L (ref 8.0–16.0)

## 2021-12-28 LAB — POCT GLUCOSE
POC Glucose: 235 mg/dl — ABNORMAL HIGH (ref 70–108)
POC Glucose: 203 mg/dl — ABNORMAL HIGH (ref 70–108)

## 2021-12-28 LAB — MAGNESIUM: Magnesium: 2 mg/dL (ref 1.6–2.4)

## 2021-12-28 MED ORDER — GLUCAGON EMERGENCY 1 MG IJ KIT
1 MG | PACK | INTRAMUSCULAR | 0 refills | Status: AC | PRN
Start: 2021-12-28 — End: 2022-01-27

## 2021-12-28 MED ORDER — GLUCOSE 4 G PO CHEW
4 | ORAL_TABLET | ORAL | 3 refills | Status: AC | PRN
Start: 2021-12-28 — End: ?

## 2021-12-28 MED ORDER — ENOXAPARIN SODIUM 120 MG/0.8ML IJ SOSY
120 MG/0.8ML | Freq: Two times a day (BID) | INTRAMUSCULAR | 0 refills | Status: AC
Start: 2021-12-28 — End: 2022-01-27
  Filled 2021-12-28: qty 11.2, 7d supply, fill #0

## 2021-12-28 MED ORDER — DEXAMETHASONE 1 MG PO TABS
1 MG | ORAL_TABLET | ORAL | 0 refills | Status: AC
Start: 2021-12-28 — End: 2022-01-22

## 2021-12-28 MED ORDER — INSULIN GLARGINE 100 UNIT/ML SC SOLN
100 UNIT/ML | Freq: Once | SUBCUTANEOUS | Status: AC
Start: 2021-12-28 — End: 2021-12-28
  Administered 2021-12-28: 10:00:00 20 [IU] via SUBCUTANEOUS

## 2021-12-28 MED ORDER — INSULIN LISPRO 100 UNIT/ML IJ SOLN
100 UNIT/ML | Freq: Three times a day (TID) | INTRAMUSCULAR | Status: DC
Start: 2021-12-28 — End: 2021-12-28
  Administered 2021-12-28: 13:00:00 15 [IU] via SUBCUTANEOUS

## 2021-12-28 MED ORDER — LEVETIRACETAM 500 MG PO TABS
500 MG | ORAL_TABLET | Freq: Two times a day (BID) | ORAL | 0 refills | Status: AC
Start: 2021-12-28 — End: 2022-01-27
  Filled 2021-12-28: qty 120, 30d supply, fill #0

## 2021-12-28 MED ORDER — INSULIN LISPRO (1 UNIT DIAL) 100 UNIT/ML SC SOPN
100 UNIT/ML | Freq: Three times a day (TID) | SUBCUTANEOUS | 0 refills | Status: DC
Start: 2021-12-28 — End: 2021-12-28

## 2021-12-28 MED ORDER — BLOOD GLUCOSE TEST VI STRP
ORAL_STRIP | 0 refills | Status: AC
Start: 2021-12-28 — End: ?

## 2021-12-28 MED ORDER — INSULIN LISPRO (1 UNIT DIAL) 100 UNIT/ML SC SOPN
100 UNIT/ML | Freq: Every evening | SUBCUTANEOUS | 0 refills | Status: DC
Start: 2021-12-28 — End: 2021-12-28

## 2021-12-28 MED ORDER — LANCETS MISC
Freq: Every day | 0 refills | Status: AC
Start: 2021-12-28 — End: ?

## 2021-12-28 MED ORDER — FREESTYLE FREEDOM KIT
PACK | Freq: Every day | 0 refills | Status: AC | PRN
Start: 2021-12-28 — End: ?

## 2021-12-28 MED ORDER — LACTATED RINGERS IV SOLN
INTRAVENOUS | Status: DC
Start: 2021-12-28 — End: 2021-12-28

## 2021-12-28 MED ORDER — BASAGLAR KWIKPEN 100 UNIT/ML SC SOPN
100 UNIT/ML | Freq: Two times a day (BID) | SUBCUTANEOUS | 0 refills | Status: AC
Start: 2021-12-28 — End: 2022-01-27
  Filled 2021-12-28: qty 15, 30d supply, fill #0

## 2021-12-28 MED ORDER — PANTOPRAZOLE SODIUM 40 MG PO TBEC
40 MG | ORAL_TABLET | Freq: Every day | ORAL | 3 refills | Status: AC
Start: 2021-12-28 — End: ?
  Filled 2021-12-28: qty 30, 30d supply, fill #0

## 2021-12-28 MED ORDER — INSULIN LISPRO (1 UNIT DIAL) 100 UNIT/ML SC SOPN
100 UNIT/ML | Freq: Three times a day (TID) | SUBCUTANEOUS | 2 refills | Status: AC
Start: 2021-12-28 — End: 2022-04-27

## 2021-12-28 MED ORDER — INSULIN LISPRO 100 UNIT/ML IJ SOLN
100 | Freq: Once | INTRAMUSCULAR | Status: AC
Start: 2021-12-28 — End: 2021-12-27
  Administered 2021-12-28: 01:00:00 10 [IU] via SUBCUTANEOUS

## 2021-12-28 MED ORDER — INSULIN PEN NEEDLE 32G X 4 MM MISC
Freq: Every day | 0 refills | Status: AC
Start: 2021-12-28 — End: 2022-01-27
  Filled 2021-12-28: qty 15, 33d supply, fill #0

## 2021-12-28 MED FILL — LEVETIRACETAM 500 MG PO TABS: 500 MG | ORAL | Qty: 2

## 2021-12-28 MED FILL — PANTOPRAZOLE SODIUM 40 MG PO TBEC: 40 MG | ORAL | Qty: 1

## 2021-12-28 MED FILL — DEXAMETHASONE SODIUM PHOSPHATE 4 MG/ML IJ SOLN: 4 MG/ML | INTRAMUSCULAR | Qty: 1

## 2021-12-28 MED FILL — HUMALOG 100 UNIT/ML IJ SOLN: 100 UNIT/ML | INTRAMUSCULAR | Qty: 2

## 2021-12-28 MED FILL — AMLODIPINE BESYLATE 5 MG PO TABS: 5 MG | ORAL | Qty: 1

## 2021-12-28 MED FILL — HUMALOG 100 UNIT/ML IJ SOLN: 100 UNIT/ML | INTRAMUSCULAR | Qty: 10

## 2021-12-28 MED FILL — HUMALOG 100 UNIT/ML IJ SOLN: 100 UNIT/ML | INTRAMUSCULAR | Qty: 15

## 2021-12-28 MED FILL — HUMALOG 100 UNIT/ML IJ SOLN: 100 UNIT/ML | INTRAMUSCULAR | Qty: 4

## 2021-12-28 MED FILL — LANTUS 100 UNIT/ML SC SOLN: 100 UNIT/ML | SUBCUTANEOUS | Qty: 20

## 2021-12-28 MED FILL — PRAVASTATIN SODIUM 80 MG PO TABS: 80 MG | ORAL | Qty: 1

## 2021-12-28 MED FILL — ENOXAPARIN SODIUM 120 MG/0.8ML IJ SOSY: 120 MG/0.8ML | INTRAMUSCULAR | Qty: 0.8

## 2021-12-28 MED FILL — ACETAMINOPHEN 325 MG PO TABS: 325 MG | ORAL | Qty: 2

## 2021-12-28 MED FILL — ONETOUCH ULTRA BLUE  STRP: ORAL | 50 days supply | Qty: 200 | Fill #0 | Status: AC

## 2021-12-28 MED FILL — TRUEPLUS 5-BEVEL PE 32G X 4 MM MISC: 100 days supply | Qty: 100 | Fill #0 | Status: AC

## 2021-12-28 MED FILL — ONETOUCH DELICA PLUS LANCET3  MISC: 90 days supply | Qty: 100 | Fill #0 | Status: AC

## 2021-12-28 MED FILL — ONETOUCH ULTRA 2 W/DEVICE KIT: 30 days supply | Qty: 1 | Fill #0 | Status: AC

## 2021-12-28 NOTE — Care Coordination-Inpatient (Signed)
12/28/21, 4:05 PM EDT    Patient goals/plan/ treatment preferences discussed by Case Manager and Social Worker.  Patient goals/plan/ treatment preferences reviewed with patient/ family.  Patient/ family verbalize understanding of discharge plan and are in agreement with goal/plan/treatment preferences.  Understanding was demonstrated using the teach back method.  AVS provided by RN at time of discharge, which includes all necessary medical information pertaining to the patients current course of illness, treatment, post-discharge goals of care, and treatment preferences.     Services At/After Discharge: None    Discharged today. No needs voiced. Home with spouse.

## 2021-12-28 NOTE — Plan of Care (Signed)
Problem: Discharge Planning  Goal: Discharge to home or other facility with appropriate resources  Outcome: Progressing  Flowsheets (Taken 12/27/2021 2000)  Discharge to home or other facility with appropriate resources: Identify barriers to discharge with patient and caregiver     Problem: Safety - Adult  Goal: Free from fall injury  Outcome: Progressing  Flowsheets (Taken 12/26/2021 0945 by Junius Finner, RN)  Free From Fall Injury:   Instruct family/caregiver on patient safety   Based on caregiver fall risk screen, instruct family/caregiver to ask for assistance with transferring infant if caregiver noted to have fall risk factors     Problem: Chronic Conditions and Co-morbidities  Goal: Patient's chronic conditions and co-morbidity symptoms are monitored and maintained or improved  Outcome: Progressing  Flowsheets (Taken 12/27/2021 2000)  Care Plan - Patient's Chronic Conditions and Co-Morbidity Symptoms are Monitored and Maintained or Improved: Monitor and assess patient's chronic conditions and comorbid symptoms for stability, deterioration, or improvement  Note: Noted with Elevated blood sugar      Problem: Respiratory - Adult  Goal: Achieves optimal ventilation and oxygenation  Outcome: Progressing  Flowsheets (Taken 12/27/2021 2000)  Achieves optimal ventilation and oxygenation: Assess for changes in respiratory status     Problem: Skin/Tissue Integrity - Adult  Goal: Skin integrity remains intact  Outcome: Progressing  Flowsheets (Taken 12/25/2021 2007 by Melina Modena, RN)  Skin Integrity Remains Intact: Monitor for areas of redness and/or skin breakdown  Note: Encouraged to turn to sides/ change position     Problem: Skin/Tissue Integrity  Goal: Absence of new skin breakdown  Description: 1.  Monitor for areas of redness and/or skin breakdown  2.  Assess vascular access sites hourly  3.  Every 4-6 hours minimum:  Change oxygen saturation probe site  4.  Every 4-6 hours:  If on nasal continuous positive  airway pressure, respiratory therapy assess nares and determine need for appliance change or resting period.  Outcome: Progressing     Problem: Pain  Goal: Verbalizes/displays adequate comfort level or baseline comfort level  Outcome: Progressing  Flowsheets (Taken 12/28/2021 0211)  Verbalizes/displays adequate comfort level or baseline comfort level:   Encourage patient to monitor pain and request assistance   Assess pain using appropriate pain scale   Administer analgesics based on type and severity of pain and evaluate response   Implement non-pharmacological measures as appropriate and evaluate response   Consider cultural and social influences on pain and pain management   Notify Licensed Independent Practitioner if interventions unsuccessful or patient reports new pain

## 2021-12-28 NOTE — Progress Notes (Signed)
See Physician's progress note.

## 2021-12-28 NOTE — Patient Instructions (Signed)
F/U 4 weeks  Start Keytruda next week  CBC/CMP/TSH in 4 weeks

## 2021-12-28 NOTE — Progress Notes (Addendum)
Name: Marcus Burnett  DOB: 06-18-1960  MRN: [id number]    Oncology Navigation Follow-Up Note    Contact Type:  Medical Oncology    Subjective: discharged from hospital; admitted with PE  Discharged on Lovenox BID     Objective: Pt's spouse verbalized they have not received the po Lenvima.  ONC reports RX sent on 12/07/21.  Nav call to [facility]: [phone];   spoke with Dorothe Pea: [phone], who informs a new prior auth was being sent today.  Nav informed ONC's plan to start tx next week; Almyra Free verbalized she would try to get it expedited.    Onc updated; spouse updated.     ONC POC:  -ONC reviewed tx options with targeted therapy:  Lenvima & Keytruda.  Pt receptive to tx.- planning start on Friday 7/21.  -Pt cont to decline brain XRT at this time, but may agree, "later"; wishes to see his reponse to the [facility].    Onc informed pt & his spouse, that pt cannot receive the systemic tx at the same time as the XRT.  They verbalized understanding.  -Pt cont steroid, anti-seizure meds & recognizes need for close bld sugar monitoring.  -Return 1 week for tx  -Return 4 weeks to see ONC  -Pt to call when RX Lenvima is received for teaching.    Assistance Needed: denies today    Receptive to Advanced Care Planning / Palliative Care:  deferred    Referrals: N/A    Education: POC reiterated    Notes: Nav following to assist as needed.    Electronically signed by Neysa Bonito, RN on 12/28/2021 at 2:51 PM

## 2021-12-28 NOTE — Progress Notes (Signed)
Discharge teaching and instructions for diagnosis/procedure of pulmonary embolism completed with patient using teachback method. AVS reviewed. Printed prescriptions given to patient. Patient voiced understanding regarding prescriptions, follow up appointments, and care of self at home. Discharged ambulatory and in a wheelchair to  home with support per family.

## 2021-12-28 NOTE — Discharge Instructions (Addendum)
You have been put on insulin because of your high blood sugars with your steroids  You will take long acting insulin twice per day.  You will have a scheduled short acting insulin with every meal. Do not take this if your blood sugar is less than 160.   You will have a separate adjustable short acting insulin dose with meals and at bedtime:    Inject 0-8 Units into the skin 3 times daily (before meals)   70-199 No Insulin   200-249 2 Units  250-299 4 Units   300-349 6 Units   Over 349 8 Units    At bedtime:  Inject 0-4 Units into the skin at bedtime  Bedtime Glucose:   70-299 No Insulin  300-349 4 Units  Over 349 4 Units     You have been started on a blood thinner which you should be taking twice per day. Monitor for signs of bleeding.    Your seizure medication dosage has been increased.     You have been started on a steroid pill which you will gradually need to reduce  the dose of every week.    You need to see your primary care doctor within the next two weeks to make sure that your blood sugars are well controlled and you are on track for your steroids.

## 2021-12-28 NOTE — Discharge Summary (Cosign Needed)
Resident Discharge Summary (Hospitalist)      Patient Identification:   Marcus Burnett   DOB: February 21, 1961  MRN: [id number]   Account: [id number]   Patient's PCP: Natividad Brood, MD    Admit Date: 12/25/2021   Discharge Date: 12/28/2021      Admitting Physician: Ardelle Lesches, DO  Discharge Physician: Rosine Door, MD       Discharge Diagnoses:  Acute Submassive Saddle Pulmonary Embolus, Present on Admission  - Identified on 12/25/2021 with extension into the right and left main pulmonary arterial branches  - PESI Score 112 points Class IV with 4-11.4% 30 day mortality secondary to history of heart failure and malignancy  - Heparin drip initiated at admission  - Hematology/oncology consulted for anticoagulation recommendations enoxaparin versus DOAC versus warfarin  -Patient started on 1mg /kg BID Lovenox prior to and continued at discharge    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  -First diagnosed March 2022  - s/p May 2022 left radical nephrectomy with pathology consistent with clear cell carcinoma Clear grade 2  - Incidentally found left frontal lobe mass and left parietotemporal mass with midline shift  - s/p craniotomy for resection of occipital and occipital temporal tumors  - Repeat MRI brain June 2023 secondary to worsening right sided weakness; with evidence of interval development of lesion in left parietal cortex as well as enlargement of pre-existing lesions  - Patient seen and evaluated by neurosurgery 12/02/2021 and patient was considered poor surgical candidate and patient was documented to have refused any radiation therapy  - 12/07/2021 outpatient oncology evaluation with patient  wanting repeat CT chest before making a decision about immunotherapy; 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 Patient was willing to start a lenvatinib.   - 12/11/2021 Rad Onc evaluation; patient at that time was willing to consider  palliative radiation therapy following results of repeat MRI  - Reached out to patient's radiation oncologist Dr. Aileen Pilot on 12/26/2021 with plan for patient to talk to provider during admission. Hematology/oncology consulted for advanced care recommendations; patient discussed case with Dr. Aileen Pilot on 12/26/2021. He has also previously extensively discussed immunotherapy versus radiation versus lenvatinib. The patient has previously declined systemic therapy or radiation therapy. He has expressed concerns about the ability of the lenvatinib to cross the blood brain barrier to address his brain mets. The patient once again reiterates that he would not want to proceed with radiation at this time and would like to follow up with Dr. Clovis Riley for further evaluation of his treatment options in the outpatient setting.   - Neurosurgery consulted for advanced care recommendations. Case discussed with Neurosurgery APP; patient is well known to the service and has previously declined any surgical intervention. Patient discharged with steroid taper  - Neurology consulted; keppra dose increased at discharge  - Ongoing GOC; Currently LIMITED x4; the patient has been informed that his malignancy is metastatic and not curable by multiple subspecialty services.     Diabetes Melltius Type II, Not Previously on Insulin  -12/01/2021 HbA1C 7.6  - Outpatient: Glipzide, metformin, Trulicity  -Outpatient glipizide discontinued at discharge and patient discharged with short and long acting insulin regimen  - Hypoglycemia rescue kit prescribed at discharge    Diastolic Heart Failure with Preserved Ejection Fraction, Not in Acute Exacerbation  -12/26/2021 ECHO showing EF 55 to 60% with grade 1 diastolic dysfunction  -Outpatient GDMT: None  - Recommendation for outpatient followup with cardiology and heart failure clinic for  optimization of GDMT. Given PMHx of DMII, patient may be an appropriate candidate for initiating SGLT2i    Acute Kidney  Injury  Type A Lactic Acidosis  HAGMA  S/P Left Nephrectomy  - Multifactorial: Heart Failure, Thromboembolic burden, Uncontrolled Hyperglycemia  - 07/13 Renal US showing small nonobstructing right renal stone, absent left kidney, and indeterminate liver lesions    Pseudohyponatremia secondary to hyperglycemia  Reactive Leukocytosis secondary to CST administration     Chronic  HLD  Essential HTN    Hospital Course:   61 year old male with PMHx of RCC with brain mets with previous craniotomy presenting to [facility] at recommendation of oncologist secondary to abnormal staging CT chest showing findings consistent with saddle PE. Patient denied any acute symptoms or concerns in the outpatient setting. He was started on heparin. MRI ordered and showing new lesions as well as enlargement of previously identified mets with edema noted. He was started on dexamethasone as well as Keppra. Neurology, oncology, and neurosurgery were consulted. The patient's hospital course was complicated by hyperglycemia as well as acute kidney injury. His metabolic derangements improved and he remained hemodynamically stable throughout the admission, not requiring supplemental oxygen. His Keppra dosage was increased and he was started on Lovenox prior to discharge. The patient was started on a short and long acting insulin regimen at discharge with discontinuation of his hypoglycemia inducing agent and concurrent prescription for a hypoglycemia rescue kit. At the recommendation of the oncology service, he was transitioned from a heparin drip to a Lovenox injection. The patient participated in multiple GOC discussions throughout his admission and displayed poor insight into his condition. At time of discharge, he wants to see his outpatient hematologist to consider initiation of immunotherapy provided that it will cross the blood-brain barrier. He was discharged to home with same day follow up with outpatient hematologist. Care was coordinated with  pharmacy team and primary RN to ensure that patient had his medications for dexamethasone as well as glycemic control at discharge.     The patient was seen and examined on day of discharge and this discharge summary is in conjunction with any daily progress note from day of discharge.    The patient is discharged in stable condition.       Exam:   Vitals:  Vitals:    12/27/21 2045 12/27/21 2319 12/28/21 0317 12/28/21 0842   BP: 139/75 (!) 111/56 105/62 134/71   Pulse: 61 59 55 51   Resp: 19 18 18 18    Temp: 97.7 F (36.5 C) 97.7 F (36.5 C) 97.7 F (36.5 C) 97.6 F (36.4 C)   TempSrc: Oral Oral Oral Oral   SpO2: 94% 95% 96% 93%   Weight:       Height:         Weight: Weight - Scale: 267 lb 11.2 oz (121.4 kg)     General appearance: No apparent distress, well developed, appears stated age.  Eyes:  Pupils equal, round, and reactive to light. Conjunctivae/corneas clear.  HENT: Head normal in appearance. External nares normal.  Oral mucosa moist without lesions.  Hearing grossly intact.   Neck: Supple, with full range of motion. Trachea midline.  No gross JVD appreciated.  Respiratory:  Normal respiratory effort. Clear to auscultation, bilaterally without rales or wheezes or rhonchi.  Cardiovascular: Normal rate, regular rhythm with normal S1/S2 without murmurs.  No lower extremity edema.   Abdomen: Soft, non-tender, non-distended with normal bowel sounds.  Musculoskeletal: There is no joint swelling  or tenderness. Normal tone. No abnormal movements.   Skin: Warm and dry. No rashes or lesions.  Neurologic:  No focal sensory/motor deficits in the upper and lower extremities. Cranial nerves:  grossly non-focal 2-12.     Psychiatric: Alert and oriented; answers questions appropriately, frequently displays word finding  Capillary Refill: Brisk,< 3 seconds.  Peripheral Pulses: +2 palpable, equal bilaterally.       Labs: For convenience the most recent labs are provided:  CBC:    Lab Results   Component Value Date/Time     WBC 11.4 12/28/2021 05:47 AM    HGB 11.6 12/28/2021 05:47 AM    HCT 35.2 12/28/2021 05:47 AM    PLT 332 12/28/2021 05:47 AM     Renal:    Lab Results   Component Value Date/Time    NA 137 12/28/2021 05:47 AM    K 4.7 12/28/2021 05:47 AM    K 4.8 12/26/2021 03:29 AM    CL 104 12/28/2021 05:47 AM    CO2 21 12/28/2021 05:47 AM    BUN 30 12/28/2021 05:47 AM    CREATININE 1.3 12/28/2021 05:47 AM    CALCIUM 9.1 12/28/2021 05:47 AM     Liver:   Lab Results   Component Value Date/Time    AST 15 07/24/2021 04:12 AM    ALT 16 07/24/2021 04:12 AM         Significant Diagnostic Studies    Radiology:   US RENAL COMPLETE   Final Result   1. Small nonobstructing right renal stone. Right kidney otherwise    unremarkable, without obstruction.   2. Absent left kidney.   3. Unremarkable appearance of the urinary bladder.   4. Liver lesions are indeterminate in appearance, are not accounted for on    the prior CT scan. Recommend liver protocol pre and postcontrast CT or MRI    to further evaluate, unless there are more recent imaging studies of the    liver which account for these.      This document has been electronically signed by: Charmayne Sheer, MD    on 12/28/2021 06:22 AM      MRI BRAIN W WO CONTRAST   Final Result      1. Interval deterioration since previous study dated 11/29/2021 with enlargement of the lesions in the left parietal and temporal lobes. There is extensive edema surrounding the left parietal lesion and moderate edema surrounding the left temporal lesion.   2. There are lesions in the left frontal lobe and left cerebellar hemisphere slightly smaller than on previous study.   3. There are postoperative changes involving the left frontal and parietal calvarium, right temporal and occipital lobes.   4. Areas of encephalomalacia especially adjacent to the right sylvian fissure, in the right temporal and occipital lobes.   5. Inflammatory changes in the frontal sinus.         **This report has been created  using voice recognition software. It may contain minor errors which are inherent in voice recognition technology.**         Final report electronically signed by DR Sherre Poot on 12/25/2021 4:00 PM      CT HEAD WO CONTRAST   Final Result      1. Stable CT scan of the brain, no interval change since previous MRI scan dated 11/29/2021.   2. 14 x 16 mm mass in the left parietal lobe with extensive surrounding edema.   3. 8.7 x 8.2 mm mass in  the left posterior temporal lobe with moderate surrounding edema.   4. Postoperative changes involving the right temporal calvarium, left frontal and parietal calvarium.   5. Encephalomalacia in the right posterior temporal and right frontal lobes.   6. Inflammatory changes in the frontal sinus.   7. No evidence of intracranial hemorrhage.         **This report has been created using voice recognition software. It may contain minor errors which are inherent in voice recognition technology.**      Final report electronically signed by DR Sherre Poot on 12/25/2021 2:46 PM             Consults:   IP CONSULT TO NEUROLOGY  IP CONSULT TO ONCOLOGY    Disposition: Home  Condition at Discharge: Stable    Code Status:  Prior     Patient Instructions:    Discharge lab work: CBC, BMP 1 week  Activity: activity as tolerated  Diet: No diet orders on file      Follow-up visits:   Natividad Brood, MD  [address]  [address]  [place] [address]  [phone]    Follow up on 01/04/2022  your appointment time is at 10:00a, Please arrive early, Bring insurance card & Photo ID, co-pay, medication bottles & completed forms.    Mercer Pod., MD  [address]  [address]  [place] [address]  [phone]    Go today  For follow up appointment         Discharge Medications:      Medication List        START taking these medications      Basaglar KwikPen 100 UNIT/ML injection pen  Generic drug: insulin glargine  Inject 25 Units into the skin 2 times daily Anticipating that this  regimen will need to be optimized by patient's PCP at discharge secondary to corticosteroid use.     blood glucose test strips  Test 4 times a day & as needed for symptoms of irregular blood glucose. Dispense sufficient amount for indicated testing frequency plus additional to accommodate PRN testing needs.     dexamethasone 1 MG tablet  Commonly known as: DECADRON  Take 4 tablets by mouth every 6 hours for 4 days, THEN 2 tablets every 6 hours for 7 days, THEN 1 tablet every 6 hours for 7 days, THEN 1 tablet 2 times daily (with meals) for 7 days.  Start taking on: December 28, 2021     enoxaparin 120 MG/0.8ML injection  Commonly known as: LOVENOX  Inject 0.8 mLs into the skin in the morning and 0.8 mLs in the evening.     Glucagon Emergency 1 MG Kit  Inject 1 mg into the skin as needed (Blood glucose LESS THAN 70 mg/dL and patient NOT ALERT or NPO and does not have IV access.)     glucose 4 g chewable tablet  Take 4 tablets by mouth as needed for Low blood sugar     glucose monitoring kit  1 kit by Does not apply route daily as needed (check your blood sugars 4 times a day (before meals and at bedtime))     insulin lispro (1 Unit Dial) 100 UNIT/ML Sopn  Commonly known as: HumaLOG KwikPen  Inject 0-15 Units into the skin 3 times daily (before meals) 1) Use 15 units scheduled with meals unless blood sugar is less than 160. 2) Then, administer an adjustable dose with meals and  at bedtime based on your discharge instructions.,     Insulin Pen Needle 32G X 4 MM Misc  1 each by Does not apply route daily     Lancets Misc  1 each by Does not apply route daily     pantoprazole 40 MG tablet  Commonly known as: PROTONIX  Take 1 tablet by mouth every morning (before breakfast)  Start taking on: December 29, 2021            CHANGE how you take these medications      levETIRAcetam 500 MG tablet  Commonly known as: KEPPRA  Take 2 tablets by mouth 2 times daily  What changed: how much to take            CONTINUE taking these medications       amLODIPine 5 MG tablet  Commonly known as: NORVASC  Take 1 tablet by mouth daily     metFORMIN 500 MG tablet  Commonly known as: GLUCOPHAGE     MULTIVITAMIN ADULT PO     pravastatin 80 MG tablet  Commonly known as: PRAVACHOL     Trulicity 1.5 MG/0.5ML SC injection  Generic drug: dulaglutide            STOP taking these medications      glipiZIDE 10 MG extended release tablet  Commonly known as: GLUCOTROL XL            ASK your doctor about these medications      Lenvatinib (20 MG Daily Dose) 2 x 10 MG Cppk  Take 20 mg by mouth daily               Where to Get Your Medications        These medications were sent to [facility] - [place], [place] - [address] - [place] [phone] Carmon Ginsberg [phone]  [address], [place] [address]      Phone: [phone]   Basaglar KwikPen 100 UNIT/ML injection pen  blood glucose test strips  dexamethasone 1 MG tablet  enoxaparin 120 MG/0.8ML injection  Glucagon Emergency 1 MG Kit  glucose 4 g chewable tablet  glucose monitoring kit  insulin lispro (1 Unit Dial) 100 UNIT/ML Sopn  Insulin Pen Needle 32G X 4 MM Misc  Lancets Misc  levETIRAcetam 500 MG tablet  pantoprazole 40 MG tablet          Time Spent on discharge is 35 minutes in the examination, evaluation, counseling and review of medications and discharge plan.    Thank you Natividad Brood, MD for the opportunity to be involved in this patient's care.      Signed:    Electronically signed by Rosine Door, MD on 12/28/21 at 6:16 PM EDT     Case was discussed with Attending, Dr. Raenette Rover
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
## 2021-12-31 NOTE — Telephone Encounter (Signed)
Nav received call from J. [facility], with need for verbal order/Dr Alroy Dust, to change pt's quantity to 60, as it needs to be, with 2 tabs daily dosing of Lenvatinib.  Almyra Free informs prior auth received & she would be calling pt to coordinate the delivery.  Nav expressed gratitude for expediting the RX for delivery.

## 2022-01-01 ENCOUNTER — Ambulatory Visit: Payer: MEDICAID | Primary: Family Medicine

## 2022-01-02 ENCOUNTER — Emergency Department: Admit: 2022-01-02 | Payer: MEDICAID | Primary: Family Medicine

## 2022-01-02 ENCOUNTER — Emergency Department: Payer: MEDICAID | Primary: Family Medicine

## 2022-01-02 ENCOUNTER — Inpatient Hospital Stay
Admission: EM | Admit: 2022-01-02 | Discharge: 2022-01-15 | Disposition: E | Payer: MEDICAID | Admitting: Nurse Practitioner

## 2022-01-02 ENCOUNTER — Ambulatory Visit: Payer: MEDICAID | Attending: Radiation Oncology | Primary: Family Medicine

## 2022-01-02 DIAGNOSIS — I629 Nontraumatic intracranial hemorrhage, unspecified: Secondary | ICD-10-CM

## 2022-01-02 DIAGNOSIS — G8191 Hemiplegia, unspecified affecting right dominant side: Secondary | ICD-10-CM

## 2022-01-02 LAB — CBC WITH AUTO DIFFERENTIAL
Basophils Absolute: 0.1 10*3/uL (ref 0.0–0.1)
Basophils: 0.6 %
Eosinophils Absolute: 0.9 10*3/uL — ABNORMAL HIGH (ref 0.0–0.4)
Eosinophils: 5.1 %
Hematocrit: 45.3 % (ref 42.0–52.0)
Hemoglobin: 14.9 gm/dl (ref 14.0–18.0)
Immature Grans (Abs): 0.8 10*3/uL — ABNORMAL HIGH (ref 0.00–0.07)
Immature Granulocytes: 4.7 %
Lymphocytes Absolute: 2.2 10*3/uL (ref 1.0–4.8)
Lymphocytes: 12.8 %
MCH: 28.1 pg (ref 26.0–33.0)
MCHC: 32.9 gm/dl (ref 32.2–35.5)
MCV: 85.3 fL (ref 80.0–94.0)
MPV: 10.1 fL (ref 9.4–12.4)
Monocytes Absolute: 1.4 10*3/uL — ABNORMAL HIGH (ref 0.4–1.3)
Monocytes: 8.5 %
Platelets: 436 10*3/uL — ABNORMAL HIGH (ref 130–400)
RBC: 5.31 10*6/uL (ref 4.70–6.10)
RDW-CV: 14.5 % (ref 11.5–14.5)
RDW-SD: 44.3 fL (ref 35.0–45.0)
Seg Neutrophils: 68.3 %
Segs Absolute: 11.5 10*3/uL — ABNORMAL HIGH (ref 1.8–7.7)
WBC: 16.9 10*3/uL — ABNORMAL HIGH (ref 4.8–10.8)
nRBC: 0 /100 wbc

## 2022-01-02 LAB — COMPREHENSIVE METABOLIC PANEL W/ REFLEX TO MG FOR LOW K
ALT: 33 U/L (ref 11–66)
AST: 23 U/L (ref 5–40)
Albumin: 3.9 g/dL (ref 3.5–5.1)
Alkaline Phosphatase: 78 U/L (ref 38–126)
BUN: 41 mg/dL — ABNORMAL HIGH (ref 7–22)
CO2: 21 meq/L — ABNORMAL LOW (ref 23–33)
Calcium: 9.5 mg/dL (ref 8.5–10.5)
Chloride: 104 meq/L (ref 98–111)
Creatinine: 1.4 mg/dL — ABNORMAL HIGH (ref 0.4–1.2)
Glucose: 78 mg/dL (ref 70–108)
Potassium reflex Magnesium: 4.9 meq/L (ref 3.5–5.2)
Sodium: 138 meq/L (ref 135–145)
Total Bilirubin: 0.2 mg/dL — ABNORMAL LOW (ref 0.3–1.2)
Total Protein: 6.7 g/dL (ref 6.1–8.0)

## 2022-01-02 LAB — EKG 12-LEAD
Atrial Rate: 57 {beats}/min
P Axis: 32 degrees
P-R Interval: 146 ms
Q-T Interval: 452 ms
QRS Duration: 90 ms
QTc Calculation (Bazett): 439 ms
R Axis: 25 degrees
T Axis: 58 degrees
Ventricular Rate: 57 {beats}/min

## 2022-01-02 LAB — APTT: aPTT: 45.8 seconds — ABNORMAL HIGH (ref 22.0–38.0)

## 2022-01-02 LAB — GLOMERULAR FILTRATION RATE, ESTIMATED: Est, Glom Filt Rate: 56 mL/min/{1.73_m2} — AB (ref >60)

## 2022-01-02 LAB — OSMOLALITY: Osmolality Calc: 284.7 mOsmol/kg (ref 275.0–300.0)

## 2022-01-02 LAB — POCT GLUCOSE: POC Glucose: 91 mg/dl (ref 70–108)

## 2022-01-02 LAB — PROTIME-INR: INR: 0.9 (ref 0.85–1.13)

## 2022-01-02 LAB — TROPONIN: Troponin, High Sensitivity: 13 ng/L — ABNORMAL HIGH (ref 0–12)

## 2022-01-02 LAB — ANION GAP: Anion Gap: 13 meq/L (ref 8.0–16.0)

## 2022-01-02 IMAGING — MR MR ABDOMEN WO/W CM
18 series · 48 of 48 positions shown · IV contrast (10 GADAVIST)
Comparison: CT abdomen and pelvis September 14, 2020.

CLINICAL DATA: Further characterization left upper pole renal
lesion

EXAM:
MRI ABDOMEN WITHOUT AND WITH CONTRAST
TECHNIQUE: Multiplanar multisequence MR imaging of the abdomen was performed
both before and after the administration of intravenous contrast.
CONTRAST:  10mL GADAVIST GADOBUTROL 1 MMOL/ML IV SOLN

[Series 2: DWI · axial · 6.0mm · 1.49mm/px · z∈[-207,+45]mm · 4 of 72 slices shown (1 of 2)]
[im 1/72]
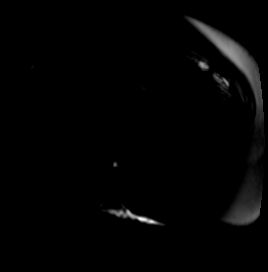
[im 24/72]
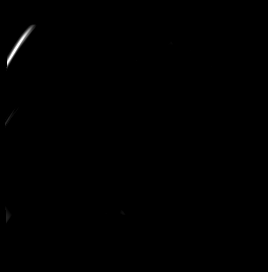
[im 48/72]
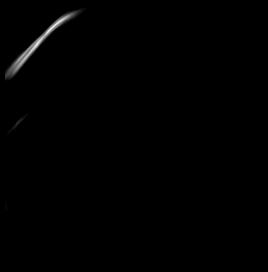
[im 72/72]
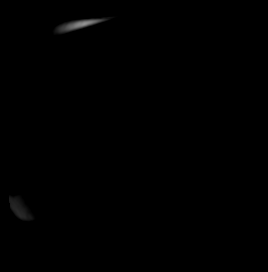

[Series 3: DWI · axial · 6.0mm · 1.49mm/px · z∈[-207,+45]mm · 2 of 36 slices shown (2 of 2)]
[im 1/36]
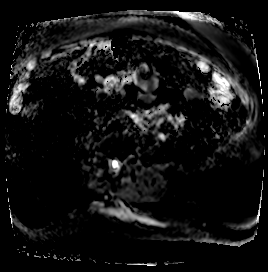
[im 36/36]
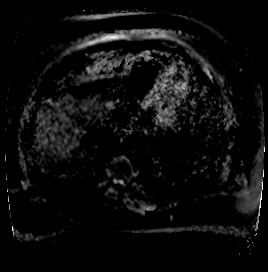

[Series 4: T2 fat-sat · axial · 6.0mm · 1.25mm/px · 1 of 36 slices shown]
[im 1/36]
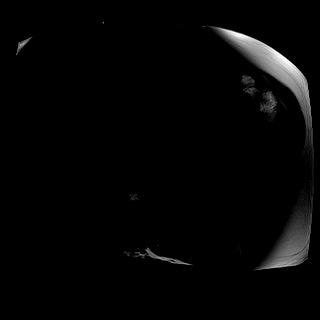

[Series 6: T2 · coronal · 6.0mm · 1.56mm/px · 2 of 40 slices shown (1 of 2)]
[im 1/40]
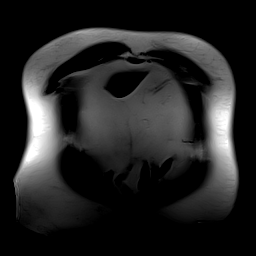
[im 40/40]
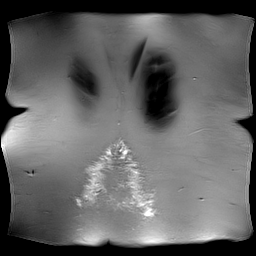

[Series 7: T1 · axial · 3.0mm · 1.38mm/px · z∈[-214,-1]mm · 3 of 72 slices shown (1 of 2)]
[im 1/72]
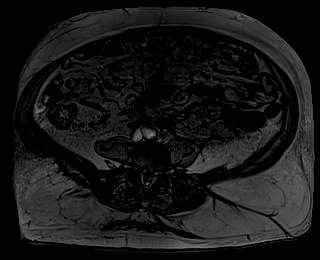
[im 36/72]
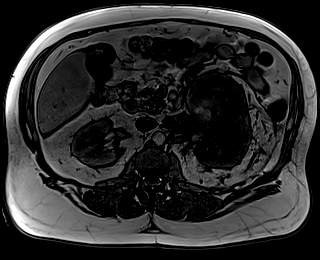
[im 72/72]
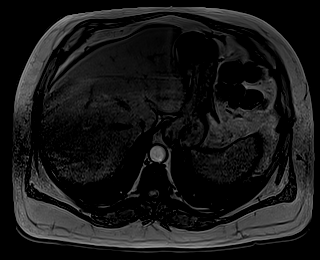

[Series 8: T1 · axial · 3.0mm · 1.38mm/px · z∈[-214,-1]mm · 3 of 72 slices shown (2 of 2)]
[im 1/72]
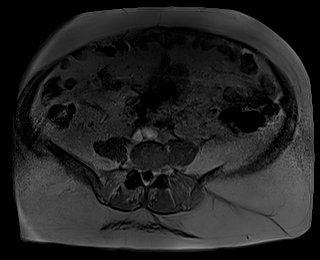
[im 36/72]
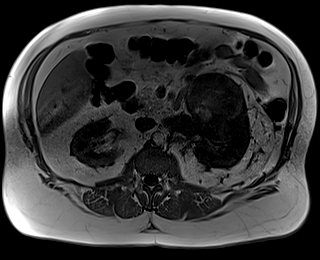
[im 72/72]
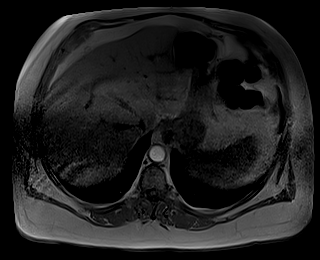

[Series 9: bSSFP · axial · 5.0mm · 0.84mm/px · z∈[-211,+31]mm · 2 of 45 slices shown]
[im 1/45]
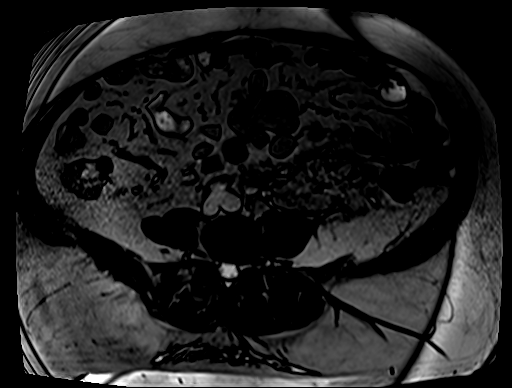
[im 45/45]
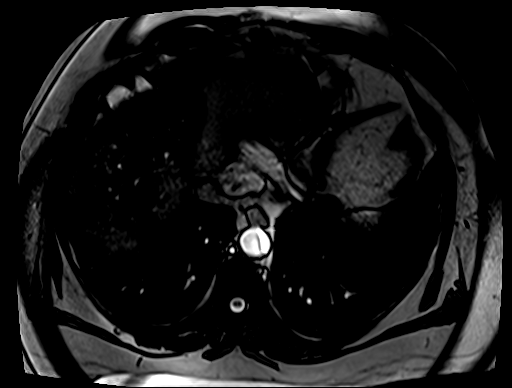

[Series 11: T1 dynamic · axial · 3.0mm · 1.38mm/px · z∈[-222,+15]mm · 3 of 80 slices shown (1 of 10)]
[im 1/80]
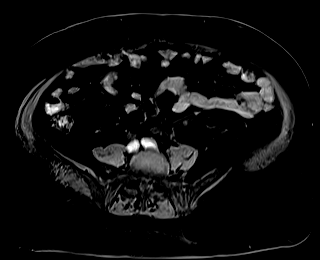
[im 40/80]
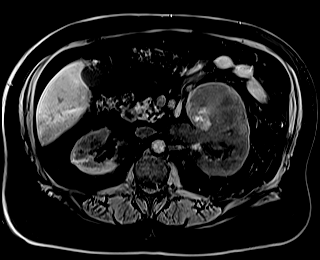
[im 80/80]
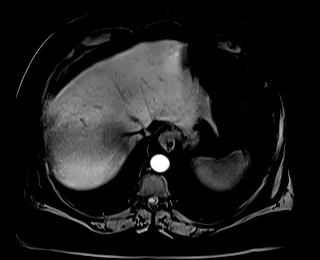

[Series 15: T1 dynamic · axial · 3.0mm · 1.38mm/px · z∈[-222,+15]mm · 3 of 80 slices shown (2 of 10)]
[im 1/80]
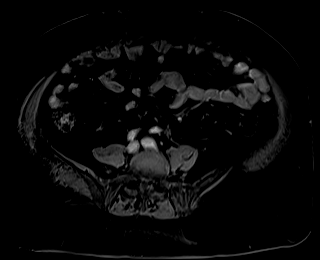
[im 40/80]
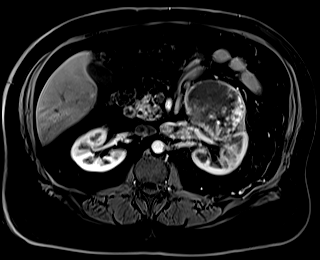
[im 80/80]
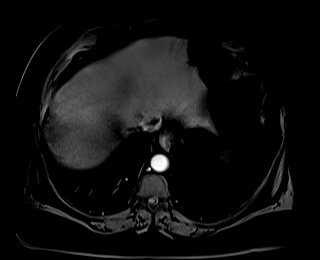

[Series 16: T1 dynamic · axial · 3.0mm · 1.38mm/px · z∈[-222,+15]mm · 3 of 80 slices shown (3 of 10)]
[im 1/80]
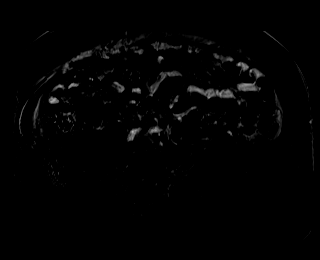
[im 40/80]
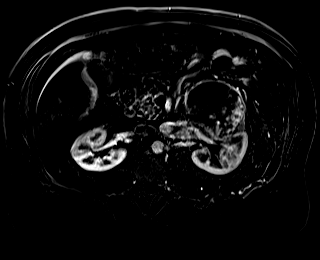
[im 80/80]
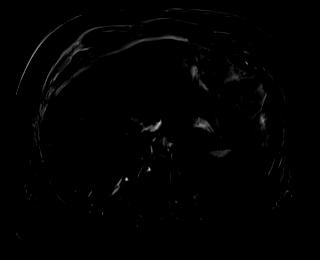

[Series 19: T1 dynamic · axial · 3.0mm · 1.38mm/px · z∈[-222,+15]mm · 3 of 80 slices shown (4 of 10)]
[im 1/80]
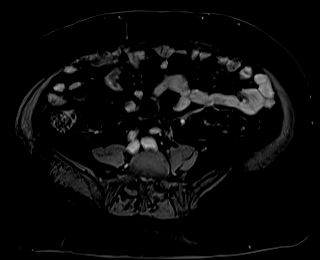
[im 40/80]
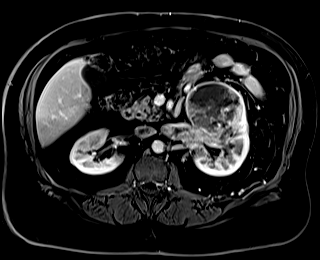
[im 80/80]
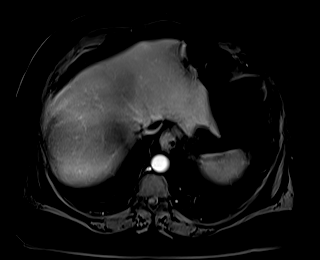

[Series 20: T1 dynamic · axial · 3.0mm · 1.38mm/px · z∈[-222,+15]mm · 3 of 80 slices shown (5 of 10)]
[im 1/80]
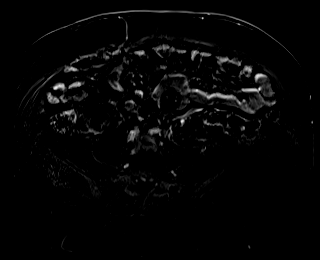
[im 40/80]
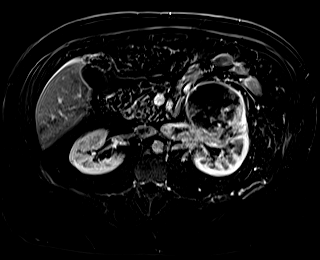
[im 80/80]
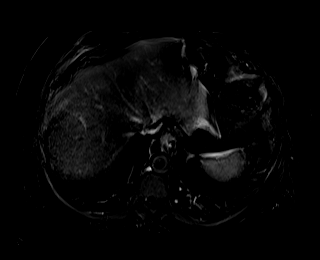

[Series 23: T1 dynamic · axial · 3.0mm · 1.38mm/px · z∈[-222,+15]mm · 3 of 80 slices shown (6 of 10)]
[im 1/80]
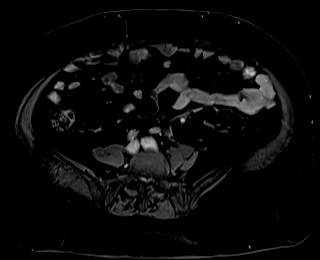
[im 40/80]
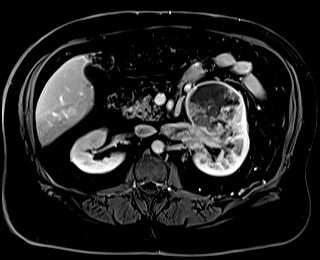
[im 80/80]
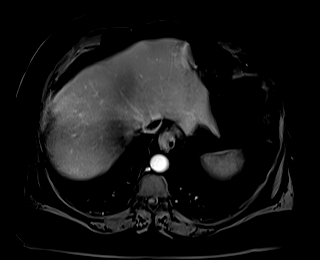

[Series 24: T1 dynamic · axial · 3.0mm · 1.38mm/px · z∈[-222,+15]mm · 3 of 80 slices shown (7 of 10)]
[im 1/80]
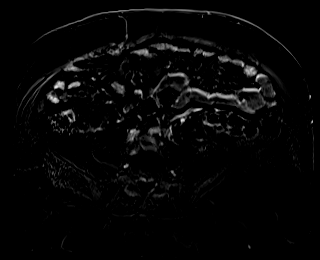
[im 40/80]
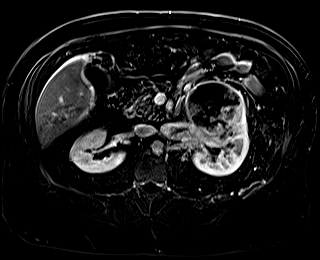
[im 80/80]
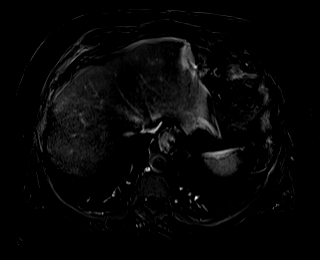

[Series 27: T2 · axial · 6.0mm · 1.56mm/px · 1 of 30 slices shown (2 of 2)]
[im 1/30]
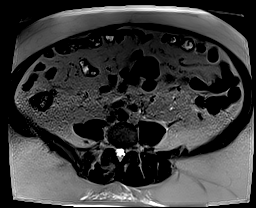

[Series 29: T1 dynamic · coronal · 3.0mm · 1.00mm/px · 3 of 72 slices shown (8 of 10)]
[im 1/72]
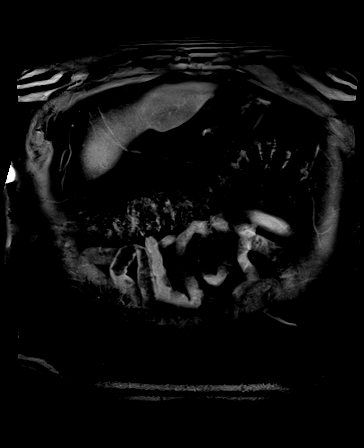
[im 36/72]
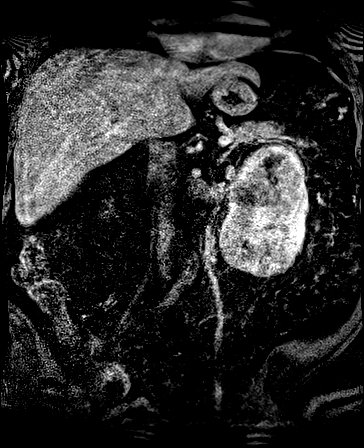
[im 72/72]
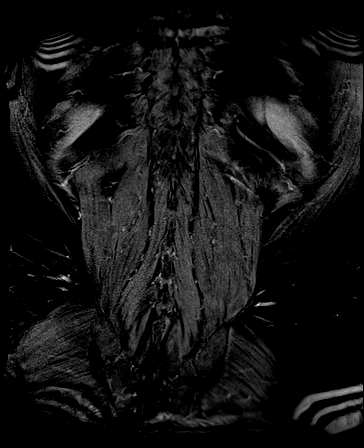

[Series 32: T1 dynamic · axial · 3.0mm · 1.38mm/px · z∈[-222,+15]mm · 3 of 80 slices shown (9 of 10)]
[im 1/80]
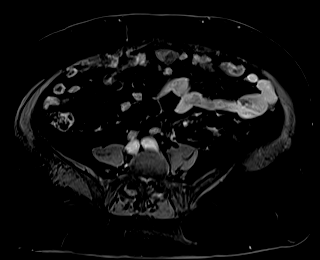
[im 40/80]
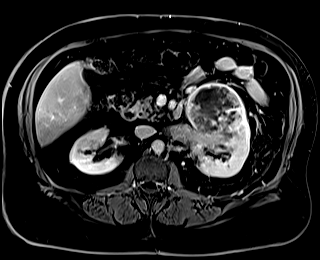
[im 80/80]
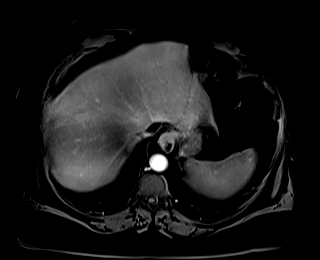

[Series 33: T1 dynamic · axial · 3.0mm · 1.38mm/px · z∈[-222,+15]mm · 3 of 80 slices shown (10 of 10)]
[im 1/80]
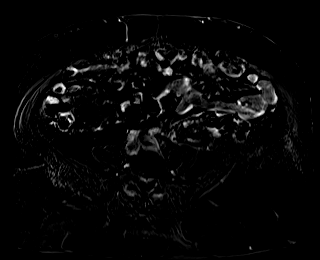
[im 40/80]
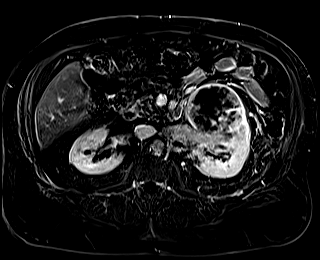
[im 80/80]
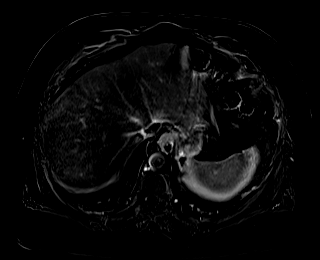

[48 of 48 positions shown; findings below may reference images not displayed]

FINDINGS: Lower chest: Lung bases are unremarkable.

Hepatobiliary: No suspicious hepatic lesion. Gallbladder is
unremarkable. No biliary ductal dilation.

Pancreas:  Within normal limits.

Spleen:  Within normal limits.

Adrenals/Urinary Tract:  Bilateral adrenal glands are unremarkable.

Enhancing heterogeneous left lower/interpolar renal mass which
measures 13.2 by 9.0 x 9.0 cm on image 57/15 and 35/29, with
adjacent perinephric stranding. There is extension of the mass into
the LEFT renal vein without definite extension into the IVC.

The right kidney is unremarkable without hydronephrosis or mass like
lesions.

Stomach/Bowel: Visualized portions within the abdomen are
unremarkable.

Vascular/Lymphatic: No abdominal aortic aneurysm. Perirenal
collateral vessels. Multiple subcentimeter lymph nodes immediately
adjacent to the kidney measuring up to 5 mm on image 46/19. No
pathologically enlarged lymph nodes in the abdomen by size
criteria.

Other:  No discrete peritoneal nodularity.  No ascites.

Musculoskeletal: No aggressive lytic or blastic lesion of bone
IMPRESSION: 1. Enhancing 13.2 cm left lower/interpolar renal mass, with
extension into the LEFT renal vein without definite extension into
the IVC. Findings are consistent with renal cell carcinoma.
2. Prominent perirenal lymph nodes measuring up to 6 mm, suspicious
for disease involvement.
3. No other evidence of metastatic disease in the abdomen.

## 2022-01-02 MED ORDER — MORPHINE SULFATE (PF) 2 MG/ML IV SOLN
2 | INTRAVENOUS | Status: DC | PRN
Start: 2022-01-02 — End: 2022-01-03

## 2022-01-02 MED ORDER — ONDANSETRON HCL 4 MG/2ML IJ SOLN
4 MG/2ML | Freq: Once | INTRAMUSCULAR | Status: AC
Start: 2022-01-02 — End: 2022-01-02
  Administered 2022-01-02: 10:00:00 4 mg via INTRAVENOUS

## 2022-01-02 MED ORDER — ACETAMINOPHEN 650 MG RE SUPP
650 | Freq: Four times a day (QID) | RECTAL | Status: DC | PRN
Start: 2022-01-02 — End: 2022-01-03

## 2022-01-02 MED ORDER — MANNITOL 20 % IV SOLN
20 | Freq: Once | INTRAVENOUS | Status: AC
Start: 2022-01-02 — End: 2022-01-02
  Administered 2022-01-02: 12:00:00 61.86 g/kg via INTRAVENOUS

## 2022-01-02 MED ORDER — MANNITOL 20 % IV SOLN
20 % | INTRAVENOUS | Status: AC
Start: 2022-01-02 — End: 2022-01-02

## 2022-01-02 MED ORDER — SCOPOLAMINE 1 MG/3DAYS TD PT72
13 MG/3DAYS | TRANSDERMAL | Status: DC
Start: 2022-01-02 — End: 2022-01-03
  Administered 2022-01-02: 18:00:00 1 via TRANSDERMAL

## 2022-01-02 MED ORDER — MORPHINE SULFATE 4 MG/ML IJ SOLN
4 MG/ML | INTRAMUSCULAR | Status: AC | PRN
Start: 2022-01-02 — End: 2022-01-03
  Administered 2022-01-02 (×2): 4 mg via INTRAVENOUS

## 2022-01-02 MED ORDER — HYDRALAZINE HCL 20 MG/ML IJ SOLN
20 | Freq: Once | INTRAMUSCULAR | Status: AC
Start: 2022-01-02 — End: 2022-01-02
  Administered 2022-01-02: 10:00:00 10 mg via INTRAVENOUS

## 2022-01-02 MED ORDER — ONDANSETRON 4 MG PO TBDP
4 MG | Freq: Three times a day (TID) | ORAL | Status: AC | PRN
Start: 2022-01-02 — End: 2022-01-03

## 2022-01-02 MED ORDER — GLYCOPYRROLATE 0.2 MG/ML IJ SOLN
0.2 MG/ML | INTRAMUSCULAR | Status: AC | PRN
Start: 2022-01-02 — End: 2022-01-03
  Administered 2022-01-02 (×3): 0.2 mg via INTRAVENOUS

## 2022-01-02 MED ORDER — ONDANSETRON HCL 4 MG/2ML IJ SOLN
4 MG/2ML | Freq: Four times a day (QID) | INTRAMUSCULAR | Status: AC | PRN
Start: 2022-01-02 — End: 2022-01-03

## 2022-01-02 MED ORDER — MORPHINE SULFATE (PF) 2 MG/ML IV SOLN
2 MG/ML | INTRAVENOUS | Status: DC | PRN
Start: 2022-01-02 — End: 2022-01-02
  Administered 2022-01-02: 16:00:00 2 mg via INTRAVENOUS

## 2022-01-02 MED ORDER — ACETAMINOPHEN 325 MG PO TABS
325 | Freq: Four times a day (QID) | ORAL | Status: DC | PRN
Start: 2022-01-02 — End: 2022-01-03

## 2022-01-02 MED ORDER — SODIUM CHLORIDE 0.9 % IV SOLN
0.9 | Freq: Two times a day (BID) | INTRAVENOUS | Status: DC
Start: 2022-01-02 — End: 2022-01-03
  Administered 2022-01-02: 18:00:00 500 mg via INTRAVENOUS

## 2022-01-02 MED ORDER — DEXAMETHASONE SODIUM PHOSPHATE 4 MG/ML IJ SOLN
4 MG/ML | Freq: Four times a day (QID) | INTRAMUSCULAR | Status: AC
Start: 2022-01-02 — End: 2022-01-03
  Administered 2022-01-02 (×2): 4 mg via INTRAVENOUS

## 2022-01-02 MED ORDER — SODIUM CHLORIDE 0.9 % IV SOLN
0.9 % | INTRAVENOUS | Status: AC | PRN
Start: 2022-01-02 — End: 2022-01-03

## 2022-01-02 MED ORDER — PROTAMINE SULFATE 10 MG/ML IV SOLN
10 | Freq: Once | INTRAVENOUS | Status: AC
Start: 2022-01-02 — End: 2022-01-02
  Administered 2022-01-02: 10:00:00 50 mg via INTRAVENOUS

## 2022-01-02 MED ORDER — NORMAL SALINE FLUSH 0.9 % IV SOLN
0.9 % | INTRAVENOUS | Status: DC | PRN
Start: 2022-01-02 — End: 2022-01-02

## 2022-01-02 MED ORDER — LORAZEPAM 2 MG/ML IJ SOLN
2 MG/ML | Freq: Four times a day (QID) | INTRAMUSCULAR | Status: DC | PRN
Start: 2022-01-02 — End: 2022-01-02

## 2022-01-02 MED ORDER — NORMAL SALINE FLUSH 0.9 % IV SOLN
0.9 % | Freq: Two times a day (BID) | INTRAVENOUS | Status: AC
Start: 2022-01-02 — End: 2022-01-03
  Administered 2022-01-02: 16:00:00 10 mL via INTRAVENOUS

## 2022-01-02 MED ORDER — LORAZEPAM 2 MG/ML IJ SOLN
2 MG/ML | INTRAMUSCULAR | Status: AC | PRN
Start: 2022-01-02 — End: 2022-01-03
  Administered 2022-01-02 (×2): 2 mg via INTRAVENOUS

## 2022-01-02 MED ORDER — MANNITOL 25 % IV SOLN
25 % | Freq: Once | INTRAVENOUS | Status: DC
Start: 2022-01-02 — End: 2022-01-02

## 2022-01-02 MED ORDER — LABETALOL HCL 10 MG/2ML IV SOSY
10 MG/2ML | Freq: Once | INTRAVENOUS | Status: AC
Start: 2022-01-02 — End: 2022-01-02
  Administered 2022-01-02: 10:00:00 20 mg via INTRAVENOUS

## 2022-01-02 MED ORDER — DROPERIDOL 2.5 MG/ML IJ SOLN
2.5 MG/ML | Freq: Four times a day (QID) | INTRAMUSCULAR | Status: AC | PRN
Start: 2022-01-02 — End: 2022-01-03
  Administered 2022-01-02: 10:00:00 0.625 mg via INTRAVENOUS

## 2022-01-02 MED FILL — DEXAMETHASONE SODIUM PHOSPHATE 4 MG/ML IJ SOLN: 4 MG/ML | INTRAMUSCULAR | Qty: 1

## 2022-01-02 MED FILL — LEVETIRACETAM 500 MG/5ML IV SOLN: 500 MG/5ML | INTRAVENOUS | Qty: 5

## 2022-01-02 MED FILL — DROPERIDOL 2.5 MG/ML IJ SOLN: 2.5 MG/ML | INTRAMUSCULAR | Qty: 2

## 2022-01-02 MED FILL — MORPHINE SULFATE 4 MG/ML IJ SOLN: 4 mg/mL | INTRAMUSCULAR | Qty: 1

## 2022-01-02 MED FILL — ATIVAN 2 MG/ML IJ SOLN: 2 MG/ML | INTRAMUSCULAR | Qty: 1

## 2022-01-02 MED FILL — OSMITROL 20 % IV SOLN: 20 % | INTRAVENOUS | Qty: 250

## 2022-01-02 MED FILL — MANNITOL 25 % IV SOLN: 25 % | INTRAVENOUS | Qty: 200

## 2022-01-02 MED FILL — GLYCOPYRROLATE 0.2 MG/ML IJ SOLN: 0.2 MG/ML | INTRAMUSCULAR | Qty: 1

## 2022-01-02 MED FILL — PROTAMINE SULFATE 10 MG/ML IV SOLN: 10 MG/ML | INTRAVENOUS | Qty: 5

## 2022-01-02 MED FILL — ONDANSETRON HCL 4 MG/2ML IJ SOLN: 4 MG/2ML | INTRAMUSCULAR | Qty: 2

## 2022-01-02 MED FILL — MORPHINE SULFATE 2 MG/ML IJ SOLN: 2 mg/mL | INTRAMUSCULAR | Qty: 1

## 2022-01-02 MED FILL — LABETALOL HCL 10 MG/2ML IV SOSY: 10 MG/2ML | INTRAVENOUS | Qty: 4

## 2022-01-02 MED FILL — HYDRALAZINE HCL 20 MG/ML IJ SOLN: 20 MG/ML | INTRAMUSCULAR | Qty: 1

## 2022-01-02 MED FILL — DROPERIDOL 2.5 MG/ML IJ SOLN: 2.5 MG/ML | INTRAMUSCULAR | Qty: 0.25

## 2022-01-02 MED FILL — TRANSDERM-SCOP 1 MG/3DAYS TD PT72: 1 MG/3DAYS | TRANSDERMAL | Qty: 1

## 2022-01-02 NOTE — Plan of Care (Signed)
Called for admit per ED. Hospice consult in ED. Awaiting Hospice recommendations inpatient versus discharge home.     Electronically signed by Ricci Barker, APRN - CNP on 2022/01/03 at 7:57 AM

## 2022-01-02 NOTE — Progress Notes (Signed)
Hospice referral completed in room with wife Marcus Burnett and niece Marcus Burnett in room. Marcus Burnett lying in bed with respirations as if snoring, when asked about snoring, Marcus Burnett shared he snored but not that bad. Patient with appearance of comfort during first part of hospice referral. No comfort medication given since arrival in ED. Wife shared patient's cancer background and where care was at. Appropriately upset, as patient able to walk, and was talking with ability to take care of himself prior to squad call and now he is responding only to pain. "Things just happening quicker and not how I thought it would go". Support provided with open listening. Hospice referral completed with discussion on each level of hospice care and criteria for each level. At this time patient's sons are heading up from [place] and will be to [place] around 8-9 this evening. Marcus Burnett wishes to speak with them before making decision on home with family to provide ATC care(does not think this is an option as they live with her elderly mother) or to facility with patient having medicaid that will may for stay, if patient doesn't meet for inpatient hospice.   Hospice referral folder given and let wife know that nurse will remain available and plan for follow up on 07.20.2023.   Contacted Dr. Drucie Ip with update and suggestions on comfort medications. New orders received.

## 2022-01-02 NOTE — ED Notes (Signed)
Patient's wife, POA, signed DNR-CC form at this time.      Bela Bonaparte Jonette Mate, RN  01/15/22 [phone]

## 2022-01-02 NOTE — ED Notes (Signed)
Bedside shift report given to [facility], Therapist, sports.      Jaylise Peek Jonette Mate, RN  29-Jan-2022 [phone]

## 2022-01-02 NOTE — ED Notes (Signed)
Upon first contact pt is resting on cot with eyes closed. Pt is not answering questions. Pt able to follow some commands. Pt able to move his left are on his own.      Leafy Ro, RN  01/19/22 [phone]

## 2022-01-02 NOTE — Progress Notes (Signed)
Call placed to wife Tyquan Carmickle to see if able to take patient home this date with hospice service. She told nurse that [facility] has been able to walk up until this event and uncertain if home would be option. Overwhelmed by events and needs information about hospice(uncertain if can do at home as she does not know how that would look and if this is option, would not be ready today.) Call placed to admission hospitalist Aura Fey CNP of updated on this. Need admission to work on plan of care upon discharge with hospice, either to home or to facility. At this time patient does not meet for GIP status due to no need for comfort medications at this point. Gip level for symptoms unable to be controlled in routine level of hospice care- at home vs facility. CNP will admit and hospice to meet with wife and patient this date.

## 2022-01-02 NOTE — ED Notes (Signed)
Dr. Fredirick Maudlin at bedside talking to family. Wife states that if patient needs intubated they do not want it.      Indio Santilli Jonette Mate, RN  01-17-2022 [phone]

## 2022-01-02 NOTE — Progress Notes (Signed)
Per Dr. Fredirick Maudlin, patient is not a TPA candidate and creat is not needed at this time.

## 2022-01-02 NOTE — ED Notes (Signed)
Pt is set to be transported to 5k21 via bed. Spoke with RN [place] prior to transport.      Marcus Burnett  Jan 24, 2022 0930

## 2022-01-02 NOTE — Progress Notes (Signed)
RN into room patient noted to be absent of respirations and vital signs confirmed by Earlean Shawl and Mickel Baas, RN. Family at bedside.   Chaplain services offered and call placed.  House supervisor notified.  Hospice nurse on call notified.

## 2022-01-02 NOTE — ED Notes (Signed)
Patient presents to ED via EMS with chief complaint of stroke like symptoms. Stroke alert activated. Blood glucose level 91. Dr. Fredirick Maudlin at bedside. NIHSS 18 at time of triage. Last known well was 1830 last night.      Amarion Portell Jonette Mate, RN  January 03, 2022 [phone]

## 2022-01-02 NOTE — ED Notes (Signed)
Patient has history of brain cancer and was recently discharged for PE and was placed on lovenox.      Naziah Weckerly Jonette Mate, RN  01-10-2022 [phone]

## 2022-01-02 NOTE — ED Provider Notes (Incomplete)
[facility]      CHIEF COMPLAINT       Chief Complaint   Patient presents with    Stroke       Nurses Notes reviewed and I agree except as noted in the HPI.      HISTORY OF PRESENT ILLNESS    Marcus Burnett is a 61 y.o. male who presents with complaint of strokelike symptoms, right-sided hemiparesis, last known was 6:30 PM.  Patient has history of renal cancer with brain metastasis, recently admitted to the hospital and treated for submassive PE with Lovenox, patient was discharged home with subcu Lovenox.  Does not recall any falls today.  Onset: Acute  Duration: Approximately 12 hours  Timing: Persistent  Location of Pain: No pain  Intesity/severity: Severe right-sided hemiparesis  Modifying Factors: Brain metastasis on Lovenox  Relieved by;  Previous Episodes;  Tx Before arrival: None  REVIEW OF SYSTEMS       PAST MEDICAL HISTORY    has a past medical history of Cancer ([place]), Diabetes mellitus ([place]), and Hypertension.    SURGICAL HISTORY      has a past surgical history that includes craniotomy (Right, 07/26/2021); craniotomy (Left, 07/31/2021); Kidney removal (Left, 10/2020); and craniotomy (N/A, 08/22/2021).    CURRENT MEDICATIONS       Previous Medications    AMLODIPINE (NORVASC) 5 MG TABLET    Take 1 tablet by mouth daily    BLOOD GLUCOSE MONITOR STRIPS    Test 4 times a day & as needed for symptoms of irregular blood glucose. Dispense sufficient amount for indicated testing frequency plus additional to accommodate PRN testing needs.    DEXAMETHASONE (DECADRON) 1 MG TABLET    Take 4 tablets by mouth every 6 hours for 4 days, THEN 2 tablets every 6 hours for 7 days, THEN 1 tablet every 6 hours for 7 days, THEN 1 tablet 2 times daily (with meals) for 7 days.    ENOXAPARIN (LOVENOX) 120 MG/0.8ML INJECTION    Inject 0.8 mLs into the skin in the morning and 0.8 mLs in the evening.    GLUCAGON, RDNA, (GLUCAGON EMERGENCY) 1 MG KIT    Inject 1 mg into the skin as needed (Blood glucose LESS THAN 70  mg/dL and patient NOT ALERT or NPO and does not have IV access.)    GLUCOSE 4 G CHEWABLE TABLET    Take 4 tablets by mouth as needed for Low blood sugar    GLUCOSE MONITORING (FREESTYLE FREEDOM) KIT    1 kit by Does not apply route daily as needed (check your blood sugars 4 times a day (before meals and at bedtime))    INSULIN GLARGINE (BASAGLAR KWIKPEN) 100 UNIT/ML INJECTION PEN    Inject 25 Units into the skin 2 times daily Anticipating that this regimen will need to be optimized by patient's PCP at discharge secondary to corticosteroid use.    INSULIN LISPRO, 1 UNIT DIAL, (HUMALOG KWIKPEN) 100 UNIT/ML SOPN    Inject 0-15 Units into the skin 3 times daily (before meals) 1) Use 15 units scheduled with meals unless blood sugar is less than 160. 2) Then, administer an adjustable dose with meals and at bedtime based on your discharge instructions.,    INSULIN PEN NEEDLE 32G X 4 MM MISC    1 each by Does not apply route daily    LANCETS MISC    1 each by Does not apply route daily    LENVATINIB, 20 MG DAILY DOSE,  2 X 10 MG CPPK    Take 20 mg by mouth daily    LEVETIRACETAM (KEPPRA) 500 MG TABLET    Take 2 tablets by mouth 2 times daily    METFORMIN (GLUCOPHAGE) 500 MG TABLET    Take 2 tablets by mouth daily    MULTIPLE VITAMIN (MULTIVITAMIN ADULT PO)    Take 1 tablet by mouth daily    PANTOPRAZOLE (PROTONIX) 40 MG TABLET    Take 1 tablet by mouth every morning (before breakfast)    PRAVASTATIN (PRAVACHOL) 80 MG TABLET    TAKE 1 TABLET BY MOUTH EVERY DAY    TRULICITY 1.5 CW/2.3JS SOPN    Inject 0.5 mLs into the skin once a week Mondays       ALLERGIES     is allergic to dapagliflozin.    FAMILY HISTORY     He indicated that his mother is alive. He indicated that his father is deceased.   family history includes Diabetes in his father; Heart Disease in his father; Kidney Disease in his father.    SOCIAL HISTORY      reports that he has never smoked. He has never been exposed to tobacco smoke. He has never used smokeless  tobacco. He reports that he does not drink alcohol and does not use drugs.    PHYSICAL EXAM     INITIAL VITALS:  oral temperature is 98.1 F (36.7 C). His blood pressure is 147/79 (abnormal) and his pulse is 54. His respiration is 16 and oxygen saturation is 93%.    Physical Exam   Constitutional:  well-developed and well-nourished.   HENT: Head: Normocephalic, atraumatic, Bilateral external ears normal, Oropharynx mosit, No oral exudates, Nose normal.   Eyes: PERRL, EOMI, Conjunctiva normal, No discharge. No scleral icterus  Neck: Normal range of motion, No tenderness, Supple  Lympatics: No lymphadenopathy.   Cardiovascular: Normal rate, regular rhythm, S1 normal and S2 normal.  Exam reveals no gallop.    Pulmonary/Chest: Effort normal and breath sounds normal. No accessory muscle usage or stridor. No respiratory distress.  no wheezes. has no rales. exhibits no tenderness.   Abdominal: Soft. Bowel sounds are normal.  exhibits no distension. There is no tenderness. There is no rebound and no guarding.   Genitourinary:   Extremities: No edema, no tenderness, no cyanosis, no clubbing.    Musculoskeletal: Good range of motion in major joints is observed.  No major deformities noted.  Neurological: Lethargic, oriented 3, right-sided flaccid to both upper/lower extremity, right facial droop motor function, stroke score of 24.  GCS 13  Skin: Skin is warm, dry and intact. No rash noted. No erythema.   Psychiatric: Affect normal, judgment normal, mood normal.  DIFFERENTIAL DIAGNOSIS:     MEDICAL DECISION MAKING / ED COURSE:     1) Number and Complexity of Problems            Problem List This Visit:         Chief Complaint   Patient presents with    Stroke    ***        Differential Diagnosis includes (but not limited to):  ***        Diagnoses Considered but I have low suspicion of:   ***             Pertinent Comorbid Conditions:    ***    2)  Data Reviewed (none if left blank)          My Independent  interpretations:      EKG:      ***    Imaging: Brain bleed with subfalcine herniation    Labs:      ***                 Decision Rules/Clinical Scores utilized: NIHSS 24            External Documentation Reviewed:         Previous patient encounter documents & history available on EMR was reviewed ***             See Formal Diagnostic Results above for the lab and radiology tests and orders.    3)  Treatment and Disposition         ED Reassessment: Patient presenting with complaint of right-sided weakness, known history of renal cancer with brain metastasis, presenting with brain bleed with subfalcine herniation.  Patient DNR CC, does not want treatment, states that he like to be put on hospice.  Patient treated with antihypertensives, nausea medicine, given a single dose of protamine sulfate.  Case discussed with the hospitalist, will admit to be placed in hospice.         Case discussed with consulting clinician:  ***         Shared Decision-Making was performed and disposition discussed with the        Patient/Family and questions answered          Social determinants of health impacting treatment or disposition:           Code Status: Full      Summary of Patient Presentation:      MDM  /   Vitals Reviewed:    Vitals:    01-Feb-2022 0503 February 01, 2022 0551 February 01, 2022 0606   BP: (!) 164/96 (!) 159/83 (!) 147/79   Pulse: 62 54    Resp: 16 16    Temp: 98.1 F (36.7 C)     TempSrc: Oral     SpO2: 96% 93%        The patient was seen and examined. Appropriate diagnostic testing was performed and results reviewed with the patient.      The results of pertinent diagnostic studies and exam findings were discussed. The patient's provisional diagnosis and plan of care were discussed with the patient and present family who expressed understanding. Any medications were reviewed and indications and risks of medications were discussed with the patient /family present. Strict verbal and written return precautions, instructions and appropriate follow-up  provided to  the patient.     ED Medications administered this visit:  (None if blank)  Medications   protamine 50 mg in sodium chloride 0.9 % 50 mL IVPB (50 mg IntraVENous New Bag Feb 01, 2022 0610)   droperidol (INAPSINE) injection 0.625 mg (0.625 mg IntraVENous Given 02/01/2022 6269)   labetalol (NORMODYNE;TRANDATE) injection syringe 20 mg (20 mg IntraVENous Given 2022-02-01 0538)   ondansetron (ZOFRAN) injection 4 mg (4 mg IntraVENous Given 01-Feb-2022 0537)   hydrALAZINE (APRESOLINE) injection 10 mg (10 mg IntraVENous Given 2022/02/01 0606)               DIAGNOSTIC RESULTS     EKG: All EKG's are interpreted by the Emergency Department Physician who either signs or Co-signs this chart in the absence of a cardiologist.      RADIOLOGY: non-plain film images(s) such as CT, Ultrasound and MRI are read by the radiologist.  Plain radiographic images are visualized and preliminarily interpreted by the emergency  physician unless otherwise stated below.      LABS:   Labs Reviewed   CBC WITH AUTO DIFFERENTIAL - Abnormal; Notable for the following components:       Result Value    WBC 16.9 (*)     Platelets 436 (*)     Segs Absolute 11.5 (*)     Monocytes Absolute 1.4 (*)     Eosinophils Absolute 0.9 (*)     Immature Grans (Abs) 0.80 (*)     All other components within normal limits   COMPREHENSIVE METABOLIC PANEL W/ REFLEX TO MG FOR LOW K - Abnormal; Notable for the following components:    Creatinine 1.4 (*)     BUN 41 (*)     CO2 21 (*)     Total Bilirubin 0.2 (*)     All other components within normal limits   TROPONIN - Abnormal; Notable for the following components:    Troponin, High Sensitivity 13 (*)     All other components within normal limits   GLOMERULAR FILTRATION RATE, ESTIMATED - Abnormal; Notable for the following components:    Est, Glom Filt Rate 56 (*)     All other components within normal limits   PROTIME-INR   ANION GAP   OSMOLALITY   APTT   POCT GLUCOSE       EMERGENCY DEPARTMENT COURSE:   Vitals:    Vitals:     01-16-22 0503 01-16-2022 0551 2022-01-16 0606   BP: (!) 164/96 (!) 159/83 (!) 147/79   Pulse: 62 54    Resp: 16 16    Temp: 98.1 F (36.7 C)     TempSrc: Oral     SpO2: 96% 93%          CRITICAL CARE:       CONSULTS:  None    PROCEDURES:  none    FINAL IMPRESSION    No diagnosis found.      DISPOSITION/PLAN       PATIENT REFERRED TO:  No follow-up provider specified.    DISCHARGE MEDICATIONS:  New Prescriptions    No medications on file       (Please note that portions of this note were completed with a voice recognition program.  Efforts were made to edit the dictations but occasionally words are mis-transcribed.)    Humberto Leep, DO

## 2022-01-02 NOTE — Progress Notes (Signed)
Patient arrived to 5k21 via cart from ER. Family at bedside. Skin assessment completed by Earlean Shawl and Laura,RN.

## 2022-01-02 NOTE — Significant Event (Signed)
[facility]  Notice of Patient Passing      Patient Name- Marcus Burnett   Acct Number- [id number]   Attending Physician- Nobie Putnam Acopine, APRN -*    Admitted on-2022/01/21  5:00 AM     On 01/21/2022 at [place] patient was found in [place] with:   Absence of vital signs.   Absence of neurological response.    Confirmed time of death at 49.   Physician or On-call Physician notified of time of death- yes    Family present at time of death- yes   Spiritual care present at time of death- no    Physician was notified and orders were obtained to release the body.   Post-Mortem documentation completed; form printed, signed, and given to admitting.    Lonia Farber, RN RN Nursing Supervisor/ Manager  2022/01/21   8:48 PM

## 2022-01-02 NOTE — ED Notes (Signed)
Patient returned to room at this time.      Rolene Andrades Jonette Mate, RN  01/14/22 [phone]

## 2022-01-02 NOTE — ED Notes (Signed)
Pt sleeping on cot. Pt cleaned up.     Leafy Ro, RN  January 28, 2022 [phone]

## 2022-01-02 NOTE — Progress Notes (Signed)
Spiritual support given to wife, step daughter and niece at the time of death.  Marcus Burnett is in bed on 5k.  His wife and family are by his side.  They are making phone calls to their [employer] in [place] and to family members.  Two children and a sister are on their way.     Words of comfort given.  Prayer cards also given.    I will be back to pray with the whole family once they arrive.     01-17-22 1926   Encounter Summary   Encounter Overview/Reason  Spiritual/Emotional Needs;Crisis   Service Provided For: Family   Referral/Consult From: Nursing Supervisor/Manager   Support System Spouse;Children;Family members   Last Encounter  2022-01-17   Complexity of Encounter High   Encounter    Type Family Care   Spiritual/Emotional needs   Type Spiritual Support   Grief, Loss, and Adjustments   Type Death

## 2022-01-02 NOTE — ED Notes (Signed)
Patient taken to CT at this time. Patient in stable condition.      Keyira Mondesir Jonette Mate, RN  January 30, 2022 [phone]

## 2022-01-02 NOTE — H&P (Addendum)
Hospitalist History & Physical    Patient:  Marcus Burnett    Unit/Bed:03/003A  Date of Birth: Nov 06, 1960  MRN: [id number]   Acct: [id number]   PCP: [name], MD  Code Status: Prior    Date of Service: Pt seen/examined on 01-06-2022 and admitted to Observation with expected LOS less than two midnights due to medical therapy.     Chief Complaint: right sided hemiparesis    Assessment/Plan:  Right sided hemiparesis  Acute hemorrhagic stroke  -CTH: Acute hemorrhagic transformation of a left parietal lobe metastasis. Dominant intra-axial hematoma of the left parietal lobe with ipsilateral intraventricular extension. Large amount of intraventricular hemorrhage seen in the left lateral ventricle, 3rd ventricle and 4th ventricle. Small amount of layering hemorrhage in the right occipital horn. Worsening vasogenic edema around the left parietal lobe hematoma with mild rightward subfalcine herniation noted. Additional parenchymal lesions (see full report)  -progressive decline over last few hours now obtunded  -family declines intervention and requesting code status change to [facility] consultation. N/S agrees with no intervention per ED provider.   -will continue Keppra for seizure prevention and decadron for edema.     Renal Cell Carcinoma with brain mets, complications including encephalomalacia as well as surrounding edema and inflammatory changes.    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 Long d/w wife regarding goals of care. Agrees to no further labs, imaging, or interventions. Focus is to be on comfort.      History of Present Illness:  Marcus Burnett is a 61 y.o. male with PMHx that includes: metastatic RCC, PE, IDDMT2, HFpEF who presented to [facility] with chief complaint of slurred speech and R hemiparesis. Patient is now obtunded, wife at bedside and provides HPI. States patient discharged early Friday morning following hospitalization for PE. States patient was seen later Friday by Dr Alroy Dust. Patient has declined chemo or radiation therapy but did agree to start immunotherapy. In interim, wife states getting used to [facility] checks, insulin administration, lovenox, decadron, etc has been challenging but manageable. Last evening, she last saw patient around 1830 when he walked out to car to pick up [place] food she had brought for him. She had to sleep at another person's house last night but states patient called her around 0400. She states his speech was slurred and he said he could not move his right side. Wife summoned EMS and went to home, states patient speech was worse and he could not answer questions but she felt he understood, just could not find the words to answer. Overnight in ED, mental status has continued to worsen and he is now sonorous, obtunded, pupils not reacting, R pupil pinpoint, L pupil 54m. 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 She is fully aware of the poor prognosis related to his cancer/mets however states she was not anticipating this abrupt change. Wife is concerned about home with Hospice as she does not believe she can care for him. Hospice was consulted in ED, further recommendations to follow full evaluation.  Review of Systems: Pertinent positives as noted in the HPI. All other systems reviewed and negative.    Past Medical History:        Diagnosis Date    Cancer ([place]) 07/31/2021    Metastatic renal cell carcinoma to Brain    Diabetes mellitus ([place])     Hypertension        Past Surgical  History:        Procedure Laterality Date    CRANIOTOMY Right 07/26/2021    RIGHT SIDED CRANIOTOMY FOR 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 performed by Illene Labrador, MD at [place] Left 07/31/2021    Left Craniotomy Resection of Tumor performed by Illene Labrador, MD at [place] N/A 08/22/2021    I & D AND CLOSURE FOR LEFT CRANIAL WOUND DEHISENCE performed by Darrol Angel, MD at [place] Left 10/2020       Home Medications:   No current facility-administered medications on file prior to encounter.     Current Outpatient Medications on File Prior to Encounter   Medication Sig Dispense Refill    levETIRAcetam (KEPPRA) 500 MG tablet Take 2 tablets by mouth 2 times daily 120 tablet 0    dexamethasone (DECADRON) 1 MG tablet Take 4 tablets by mouth every 6 hours for 4 days, THEN 2 tablets every 6 hours for 7 days, THEN 1 tablet every 6 hours for 7 days, THEN 1 tablet 2 times daily (with meals) for 7 days. 162 tablet 0    enoxaparin (LOVENOX) 120 MG/0.8ML injection Inject 0.8 mLs into the skin in the morning and 0.8 mLs in the evening. 48 mL 0    Glucagon, rDNA, (GLUCAGON EMERGENCY) 1 MG KIT Inject 1 mg into the skin as needed (Blood glucose LESS THAN 70 mg/dL and patient NOT ALERT or NPO and does not have IV access.) 1 kit 0    glucose 4 g chewable tablet Take 4 tablets by mouth as needed for Low blood sugar 60 tablet 3    insulin glargine (BASAGLAR KWIKPEN) 100 UNIT/ML injection pen Inject 25 Units into the skin 2 times daily Anticipating that this regimen will need to be optimized by patient's PCP at discharge secondary to corticosteroid use. 15 mL 0    pantoprazole (PROTONIX) 40 MG tablet Take 1 tablet by mouth every morning (before breakfast) 30 tablet 3    blood glucose monitor strips Test 4 times a day & as needed for symptoms of irregular blood glucose. Dispense sufficient amount for indicated testing frequency plus additional to accommodate PRN  testing needs. 200 strip 0    Insulin Pen Needle 32G X 4 MM MISC 1 each by Does not apply route daily 100 each 0    Lancets MISC 1 each by Does not apply route daily 100 each 0    glucose monitoring (FREESTYLE FREEDOM) kit 1 kit by Does not apply route daily as needed (check your blood sugars 4 times a day (before meals and at bedtime)) 1 kit 0    insulin lispro, 1 Unit Dial, (HUMALOG KWIKPEN) 100 UNIT/ML SOPN Inject 0-15 Units into the skin 3 times daily (before meals) 1) Use 15 units scheduled with meals unless blood sugar is less than 160. 2) Then, administer an adjustable dose with meals and at bedtime based on your discharge instructions., 18 mL 2    Multiple Vitamin (MULTIVITAMIN ADULT PO) Take 1 tablet by  mouth daily      Lenvatinib, 20 MG Daily Dose, 2 x 10 MG CPPK Take 20 mg by mouth daily (Patient not taking: Reported on 12/25/2021) 28 each 2    amLODIPine (NORVASC) 5 MG tablet Take 1 tablet by mouth daily 30 tablet 0    TRULICITY 1.5 HY/8.6VH SOPN Inject 0.5 mLs into the skin once a week Mondays      metFORMIN (GLUCOPHAGE) 500 MG tablet Take 2 tablets by mouth daily      pravastatin (PRAVACHOL) 80 MG tablet TAKE 1 TABLET BY MOUTH EVERY DAY         Allergies:    Dapagliflozin    Social History:    reports that he has never smoked. He has never been exposed to tobacco smoke. He has never used smokeless tobacco. He reports that he does not drink alcohol and does not use drugs.    Family History:       Problem Relation Age of Onset    Kidney Disease Father     Heart Disease Father     Diabetes Father        Diet:  Diet NPO      PHYSICAL EXAM:  BP (!) 150/85   Pulse 55   Temp 98.1 F (36.7 C) (Oral)   Resp 16   SpO2 97%   General appearance: 61 y.o. male obtunded, sonorous. No apparent distress. Patient appears stated age.  Neuro: sonorous, obtunded, pupils not reacting, R pupil pinpoint, L pupil 72m  HEENT: L craniotomy scar. Mucous membranes moist.   Neck: Supple  Respiratory:  Clear to auscultation  bilaterally.   Cardiovascular: Regular rate and rhythm. trace bilateral LE edema  Abdomen: Soft, non-tender, non-distended with normal bowel sounds.  Skin: warm, dry       Data: (All radiographs, tracings, PFTs, and imaging are personally viewed and interpreted unless otherwise noted)  Labs:   Recent Labs     028-Jul-2023 0520   WBC 16.9*   HGB 14.9   HCT 45.3   PLT 436*     Recent Labs     007/28/23 0520   NA 138   K 4.9   CL 104   CO2 21*   BUN 41*   CREATININE 1.4*   CALCIUM 9.5     Recent Labs     02023-07-28 0520   AST 23   ALT 33   BILITOT 0.2*   ALKPHOS 78     Recent Labs     0July 28, 2023 0520   INR 0.90     No results for input(s): CKTOTAL, TROPONINI in the last 72 hours.  Urinalysis:   Lab Results   Component Value Date/Time    NITRU NEGATIVE 12/27/2021 07:00 PM    WBCUA NONE SEEN 12/01/2021 05:42 PM    BACTERIA NONE SEEN 12/01/2021 05:42 PM    RBCUA NONE SEEN 12/01/2021 05:42 PM    BLOODU NEGATIVE 12/27/2021 07:00 PM    SPECGRAV 1.026 12/27/2021 07:00 PM    GLUCOSEU NEGATIVE 07/23/2021 06:00 PM       EKG: Sinus bradycardia  Otherwise normal ECG  When compared with ECG of 25-Dec-2021 10:34,  T wave amplitude has increased in Anterior leads    Radiology:  XR CHEST PORTABLE   Final Result   Stable cardiomegaly and findings of mild pulmonary venous hypertension.    Otherwise no acute cardiopulmonary findings.      This document has been electronically signed by: TSharlet Salina  Sabra Heck, DO    on 2022/01/17 06:18 AM      CT HEAD WO CONTRAST   Final Result   Acute hemorrhagic transformation of a left parietal lobe metastasis.    Dominant intra-axial hematoma of the left parietal lobe with ipsilateral    intraventricular extension. Large amount of intraventricular hemorrhage    seen in the left lateral ventricle, 3rd ventricle and 4th ventricle. Small    amount of layering hemorrhage in the right occipital horn. Worsening    vasogenic edema around the left parietal lobe hematoma with mild rightward    subfalcine herniation  noted. Additional parenchymal lesions as above      This document has been electronically signed by: Gardiner Sleeper, DO    on 17-Jan-2022 05:45 AM      All CTs at this facility use dose modulation techniques and iterative    reconstructions, and/or weight-based dosing   when appropriate to reduce radiation to a low as reasonably achievable.        CT HEAD WO CONTRAST    Result Date: 2022-01-17   ADDENDUM #1  This report was discussed with [facility] LPN on Jan 03, 6947 54:62:70 EDT. This document has been electronically signed by: Jacklynn Lewis on 01/17/22 05:52 AM * ORIGINAL REPORT *CT head without contrast Comparison: CT/KO/SR - CT HEAD WO CONTRAST - 12/25/2021 02:21 PM EDT Findings: Multiple brain metastasis again noted. New intra-axial hemorrhage upper left parietal lobe measuring 4.6 x 5.3 x 5.3 cm. Worsening marginal vasogenic edema and new ipsilateral intraventricular hemorrhage extending from the left ventricular body into the left occipital horn and 3rd ventricle. Mild layering hemorrhage in the right occipital horn. Worsening sulcal effacement of the left cerebral hemisphere and mild right midline shift of 3 mm. Encephalomalacia change again noted in the right frontal lobe. Focal brain metastasis/edema in the posterior right temporal lobe/right occipital lobe. Stable metastasis and edema of the posterior left temporal lobe. New intraventricular hemorrhage of the 4th ventricle. No additional new hemorrhage. Intracranial atherosclerosis. Orbital structures unremarkable. Stable craniotomy changes of the left parietal calvarium and right occipital parietal calvarium. Left septal deviation. Otherwise no new sinus disease. . No acute osseous deformity.     Acute hemorrhagic transformation of a left parietal lobe metastasis. Dominant intra-axial hematoma of the left parietal lobe with ipsilateral intraventricular extension. Large amount of intraventricular hemorrhage seen in the left lateral ventricle, 3rd  ventricle and 4th ventricle. Small amount of layering hemorrhage in the right occipital horn. Worsening vasogenic edema around the left parietal lobe hematoma with mild rightward subfalcine herniation noted. Additional parenchymal lesions as above This document has been electronically signed by: Gardiner Sleeper, DO on 01-17-22 05:45 AM All CTs at this facility use dose modulation techniques and iterative reconstructions, and/or weight-based dosing when appropriate to reduce radiation to a low as reasonably achievable.    XR CHEST PORTABLE    Result Date: 01/17/22  1 view chest x-ray Comparison: CR/SR - XR CHEST PORTABLE - 07/23/2021 05:48 PM EST Findings: Cardiomegaly with increased pulmonary vascularity. Otherwise no significant pleural effusion. No pneumothorax. No focal consolidation. Stable lingular opacity possibly scar. No acute osseous deformity.     Stable cardiomegaly and findings of mild pulmonary venous hypertension. Otherwise no acute cardiopulmonary findings. This document has been electronically signed by: Gardiner Sleeper, DO on 17-Jan-2022 06:18 AM        Tele:   '[]'  yes             '[x]'  no  Thank you [name], MD for the opportunity to be involved in this patient's care.    Electronically signed by Ricci Barker, APRN - CNP on 01/27/22 at 8:15 AM

## 2022-01-02 NOTE — ED Notes (Signed)
Pt sleeping on cot. Family at bedside.     Leafy Ro, RN  01/04/22 [phone]

## 2022-01-02 NOTE — ED Notes (Signed)
Spoke with Hospice RN Betsy at this time for consult to hospice care. Pt to be evaluated.      Lurline Hare, RN  01-20-22 [phone]

## 2022-01-03 ENCOUNTER — Ambulatory Visit: Payer: MEDICAID | Primary: Family Medicine

## 2022-01-03 MED FILL — LEVETIRACETAM 500 MG/5ML IV SOLN: 500 MG/5ML | INTRAVENOUS | Qty: 5

## 2022-01-03 NOTE — Telephone Encounter (Signed)
Nav received notification from [place] that she received phone call from pt's spouse to inform pt passed yesterday evening, & requested Nav be notified, to return the call.    Nav phoned pt's spouse; extending condolences.  Spouse shared details of pt's passing, & expressed gratitude of the care provided by [name] & this navigator.  Spouse extended invitation to pt's funeral service on [facility] 7/24.

## 2022-01-15 DEATH — deceased

## 2022-01-25 ENCOUNTER — Encounter: Payer: MEDICAID | Attending: Medical Oncology | Primary: Family Medicine

## 2022-07-03 ENCOUNTER — Other Ambulatory Visit (HOSPITAL_COMMUNITY): Payer: Self-pay

## 2022-10-08 ENCOUNTER — Encounter: Payer: MEDICAID | Attending: Registered Nurse | Primary: Family Medicine
